# Patient Record
Sex: Male | Born: 1970
Health system: Southern US, Community
[De-identification: ages and names within clinical notes are randomized; demographics above are authoritative.]

## PROBLEM LIST (undated history)

## (undated) DIAGNOSIS — E119 Type 2 diabetes mellitus without complications: Secondary | ICD-10-CM

## (undated) DIAGNOSIS — G459 Transient cerebral ischemic attack, unspecified: Secondary | ICD-10-CM

## (undated) DIAGNOSIS — F419 Anxiety disorder, unspecified: Secondary | ICD-10-CM

## (undated) DIAGNOSIS — F32A Depression, unspecified: Secondary | ICD-10-CM

## (undated) DIAGNOSIS — I10 Essential (primary) hypertension: Secondary | ICD-10-CM

## (undated) DIAGNOSIS — G47419 Narcolepsy without cataplexy: Secondary | ICD-10-CM

## (undated) DIAGNOSIS — F329 Major depressive disorder, single episode, unspecified: Secondary | ICD-10-CM

## (undated) DIAGNOSIS — N200 Calculus of kidney: Secondary | ICD-10-CM

## (undated) DIAGNOSIS — G4700 Insomnia, unspecified: Secondary | ICD-10-CM

## (undated) HISTORY — DX: Transient cerebral ischemic attack, unspecified: G45.9

## (undated) HISTORY — DX: Type 2 diabetes mellitus without complications: E11.9

## (undated) HISTORY — PX: NO PAST SURGERIES: SHX2092

---

## 2009-08-06 ENCOUNTER — Emergency Department (HOSPITAL_COMMUNITY): Admission: EM | Admit: 2009-08-06 | Discharge: 2009-08-06 | Payer: Self-pay | Admitting: Emergency Medicine

## 2010-07-24 ENCOUNTER — Ambulatory Visit: Payer: Self-pay | Admitting: Cardiology

## 2010-08-04 ENCOUNTER — Ambulatory Visit: Payer: Self-pay | Admitting: Cardiology

## 2012-03-25 ENCOUNTER — Encounter (HOSPITAL_COMMUNITY): Payer: Self-pay | Admitting: Emergency Medicine

## 2012-03-25 ENCOUNTER — Emergency Department (HOSPITAL_COMMUNITY)
Admission: EM | Admit: 2012-03-25 | Discharge: 2012-03-26 | Disposition: A | Discharge status: Home / Self Care | Payer: Self-pay | Attending: Emergency Medicine | Admitting: Emergency Medicine

## 2012-03-25 ENCOUNTER — Emergency Department (HOSPITAL_COMMUNITY): Payer: Self-pay

## 2012-03-25 DIAGNOSIS — Z79899 Other long term (current) drug therapy: Secondary | ICD-10-CM | POA: Insufficient documentation

## 2012-03-25 DIAGNOSIS — M79609 Pain in unspecified limb: Secondary | ICD-10-CM | POA: Insufficient documentation

## 2012-03-25 DIAGNOSIS — I10 Essential (primary) hypertension: Secondary | ICD-10-CM | POA: Insufficient documentation

## 2012-03-25 DIAGNOSIS — R079 Chest pain, unspecified: Secondary | ICD-10-CM | POA: Insufficient documentation

## 2012-03-25 DIAGNOSIS — R51 Headache: Secondary | ICD-10-CM | POA: Insufficient documentation

## 2012-03-25 HISTORY — DX: Essential (primary) hypertension: I10

## 2012-03-25 HISTORY — DX: Narcolepsy without cataplexy: G47.419

## 2012-03-25 LAB — CBC
HCT: 46.3 % (ref 39.0–52.0)
MCH: 28.1 pg (ref 26.0–34.0)
MCHC: 32.4 g/dL (ref 30.0–36.0)
RBC: 5.33 MIL/uL (ref 4.22–5.81)
RDW: 13.5 % (ref 11.5–15.5)

## 2012-03-25 LAB — BASIC METABOLIC PANEL
CO2: 29 mEq/L (ref 19–32)
Calcium: 9.8 mg/dL (ref 8.4–10.5)
Chloride: 103 mEq/L (ref 96–112)
Creatinine, Ser: 0.96 mg/dL (ref 0.50–1.35)
GFR calc Af Amer: >90 mL/min (ref >90)
Potassium: 4.3 mEq/L (ref 3.5–5.1)
Sodium: 141 mEq/L (ref 135–145)

## 2012-03-25 LAB — DIFFERENTIAL
Basophils Absolute: 0 10*3/uL (ref 0.0–0.1)
Basophils Relative: 0 % (ref 0–1)
Eosinophils Absolute: 0.4 10*3/uL (ref 0.0–0.7)
Eosinophils Relative: 5 % (ref 0–5)
Lymphs Abs: 1.7 10*3/uL (ref 0.7–4.0)
Monocytes Relative: 9 % (ref 3–12)

## 2012-03-25 MED ORDER — NITROGLYCERIN 0.4 MG SL SUBL
0.4000 mg | SUBLINGUAL_TABLET | SUBLINGUAL | Status: DC | PRN
  Administered 2012-03-25: 0.4 mg via SUBLINGUAL
  Filled 2012-03-25: qty 25

## 2012-03-25 MED ORDER — SODIUM CHLORIDE 0.9 % IV SOLN
INTRAVENOUS | Status: DC
  Administered 2012-03-25: via INTRAVENOUS

## 2012-03-25 MED ORDER — SODIUM CHLORIDE 0.9 % IV BOLUS (SEPSIS)
250.0000 mL | Freq: Once | INTRAVENOUS | Status: AC
  Administered 2012-03-25: 250 mL via INTRAVENOUS

## 2012-03-25 MED ORDER — ASPIRIN 81 MG PO CHEW
324.0000 mg | CHEWABLE_TABLET | Freq: Once | ORAL | Status: AC
  Administered 2012-03-25: 324 mg via ORAL
  Filled 2012-03-25: qty 4

## 2012-03-25 NOTE — ED Notes (Signed)
Remains resting sitting up in bed. Denies needs. No distress. Equal chest rise and fall, regular, unlabored. Wife at bedside. Call bell within reach. Will continue to monitor.

## 2012-03-25 NOTE — ED Provider Notes (Signed)
History   This chart was scribed for Shelda Jakes, MD by Shari Heritage. The patient was seen in room APA07/APA07. Patient's care was started at 56.     CSN: [id number]  Arrival date & time 03/25/12  1913   First MD Initiated Contact with Patient 03/25/12 2205      Chief Complaint  Patient presents with  . Hypertension    (Consider location/radiation/quality/duration/timing/severity/associated sxs/prior treatment) Patient is a 41 y.o. male presenting with shoulder pain and chest pain.  Shoulder Pain This is a new problem. The current episode started 6 to 12 hours ago. The problem occurs constantly. The problem has not changed since onset.Associated symptoms include chest pain and headaches. Pertinent negatives include no abdominal pain and no shortness of breath. Nothing aggravates the symptoms.  Chest Pain The chest pain began 6 - 12 hours ago. Chest pain occurs constantly. The chest pain is unchanged. The severity of the pain is moderate. The quality of the pain is described as squeezing. The pain radiates to the left shoulder. Pertinent negatives for primary symptoms include no fever, no shortness of breath, no cough, no abdominal pain, no nausea and no vomiting.  Associated symptoms include diaphoresis.  Pertinent negatives for associated symptoms include no lower extremity edema. He tried nothing for the symptoms.  His past medical history is significant for hypertension.  His family medical history is significant for heart disease in family.    Gary Wyatt is a 41 y.o. male who presents to the Emergency Department complaining of constant, moderate left shoulder pain and left-sided chest pain onset 8 hours ago. Patient says pain is not worsened by movement of his arm. Patient describes pain in shoulder and chest as "squeezing." Patient is also experiencing diaphoresis, HA and neck pain. Patient ranks the pain as 5/10. Patient said he came in today because his BP was 155/107  after a recent work check. Patient's BP now is 144/98 (10:55PM). Patient denies SOB, nausea, vomiting, visual changes, fever, back pain, sore throat, cough, congestion, dysuria, leg swelling, rash, no history of bleeding problems. Patient takes Prilosec and beta blockers to manage his blood pressure. Patient has no history of MI in his family. Patient with h/o HTN and narcolepsy. Patient has never smoked.    PCP - Viss Jonita Albee, [place])  Past Medical History  Diagnosis Date  . Hypertension   . Narcolepsy     History reviewed. No pertinent past surgical history.  History reviewed. No pertinent family history.  History  Substance Use Topics  . Smoking status: Never Smoker   . Smokeless tobacco: Not on file  . Alcohol Use: Yes     ocassionally      Review of Systems  Constitutional: Positive for diaphoresis. Negative for fever.  HENT: Positive for neck pain. Negative for congestion and sore throat.   Eyes: Negative for visual disturbance.  Respiratory: Negative for cough and shortness of breath.   Cardiovascular: Positive for chest pain. Negative for leg swelling.  Gastrointestinal: Negative for nausea, vomiting, abdominal pain and diarrhea.  Genitourinary: Negative for dysuria.  Musculoskeletal: Negative for back pain.  Skin: Negative for rash.  Neurological: Positive for headaches.   Patient is positive for shoulder pain, chest pain, diaphoresis, HA, and neck pain. Patient is negative for SOB, nausea, vomiting, visual changes, fever, back pain, sore throat, cough, congestion, dysuria, leg swelling, and rash.  Allergies  Demerol  Home Medications   Current Outpatient Rx  Name Route Sig Dispense Refill  . METHYLPHENIDATE  HCL 20 MG PO TABS Oral Take 20 mg by mouth continuous dialysis.    Marland Kitchen METOPROLOL TARTRATE 50 MG PO TABS Oral Take 75 mg by mouth daily.    Marland Kitchen OMEPRAZOLE 20 MG PO CPDR Oral Take 20 mg by mouth daily.    . ASPIRIN 81 MG PO CHEW Oral Chew 1 tablet (81 mg total) by  mouth daily. 30 tablet 1  . HYDROCODONE-ACETAMINOPHEN 5-325 MG PO TABS Oral Take 1-2 tablets by mouth every 6 (six) hours as needed for pain. 10 tablet 0    BP 119/70  Pulse 67  Temp 97.4 F (36.3 C) (Oral)  Resp 18  Ht 6\' 2"  (1.88 m)  Wt 220 lb (99.791 kg)  BMI 28.25 kg/m2  SpO2 97%  Physical Exam  Nursing note and vitals reviewed. Constitutional: He is oriented to person, place, and time. He appears well-developed and well-nourished.  HENT:  Head: Normocephalic and atraumatic.  Eyes: Conjunctivae and EOM are normal. Pupils are equal, round, and reactive to light.  Neck: Normal range of motion. Neck supple.  Cardiovascular: Normal rate, regular rhythm and normal heart sounds.   No murmur heard. Pulmonary/Chest: Effort normal and breath sounds normal. No respiratory distress. He has no wheezes. He has no rales.  Abdominal: Soft. Bowel sounds are normal. There is no tenderness.  Musculoskeletal: Normal range of motion.       Right ankle: He exhibits no swelling.       Left ankle: He exhibits no swelling.  Neurological: He is alert and oriented to person, place, and time.  Skin: Skin is warm and dry.  Psychiatric: He has a normal mood and affect.    ED Course  Procedures (including critical care time) DIAGNOSTIC STUDIES: Oxygen Saturation is 100% on room air, normal by my interpretation.    COORDINATION OF CARE: 10:49PM- Patient informed of current plan for treatment and evaluation and agrees with plan at this time. BP is 144/98.  Results for orders placed during the hospital encounter of 03/25/12  CBC      Component Value Range   WBC 7.1  4.0 - 10.5 K/uL   RBC 5.33  4.22 - 5.81 MIL/uL   Hemoglobin 15.0  13.0 - 17.0 g/dL   HCT 78.2  95.6 - 21.3 %   MCV 86.9  78.0 - 100.0 fL   MCH 28.1  26.0 - 34.0 pg   MCHC 32.4  30.0 - 36.0 g/dL   RDW 08.6  57.8 - 46.9 %   Platelets 243  150 - 400 K/uL  DIFFERENTIAL      Component Value Range   Neutrophils Relative 62  43 - 77 %     Neutro Abs 4.3  1.7 - 7.7 K/uL   Lymphocytes Relative 24  12 - 46 %   Lymphs Abs 1.7  0.7 - 4.0 K/uL   Monocytes Relative 9  3 - 12 %   Monocytes Absolute 0.6  0.1 - 1.0 K/uL   Eosinophils Relative 5  0 - 5 %   Eosinophils Absolute 0.4  0.0 - 0.7 K/uL   Basophils Relative 0  0 - 1 %   Basophils Absolute 0.0  0.0 - 0.1 K/uL  BASIC METABOLIC PANEL      Component Value Range   Sodium 141  135 - 145 mEq/L   Potassium 4.3  3.5 - 5.1 mEq/L   Chloride 103  96 - 112 mEq/L   CO2 29  19 - 32 mEq/L   Glucose,  Bld 97  70 - 99 mg/dL   BUN 11  6 - 23 mg/dL   Creatinine, Ser 1.61  0.50 - 1.35 mg/dL   Calcium 9.8  8.4 - 09.6 mg/dL   GFR calc non Af Amer >90  >90 mL/min   GFR calc Af Amer >90  >90 mL/min  TROPONIN I      Component Value Range   Troponin I <0.30  <0.30 ng/mL  D-DIMER, QUANTITATIVE      Component Value Range   D-Dimer, Quant <0.22  0.00 - 0.48 ug/mL-FEU    Dg Chest 2 View  03/26/2012  *RADIOLOGY REPORT*  Clinical Data: Hypertension; chest pain.  CHEST - 2 VIEW  Comparison: None.  Findings: The lungs are well-aerated.  Minimal left basilar opacity likely reflects atelectasis.  There is no evidence of pleural effusion or pneumothorax.  The heart is borderline normal in size; the mediastinal contour is within normal limits.  No acute osseous abnormalities are seen.  IMPRESSION: Minimal left basilar opacity likely reflects atelectasis; lungs otherwise clear.  Original Report Authenticated By: Tonia Ghent, M.D.    Date: 03/26/2012  Rate: 56  Rhythm: sinus bradycardia  QRS Axis: normal  Intervals: normal  ST/T Wave abnormalities: normal  Conduction Disutrbances:none  Narrative Interpretation:   Old EKG Reviewed: none available    1. Chest pain       MDM  Patient with onset of left-sided chest pressure and shoulder and arm pain at 3:00 in the afternoon his been constant ever is gone away so first cardiac marker was done after 6 hours and that was negative also d-dimer  negative for pulmonary embolism patient has no leg swelling. No risk factors for pulmonary embolism. Patient has a primary care doctor in the near and can followup with him will recommend starting a baby aspirin a day and hydrocodone as needed for pain. EKG without any acute changes cardiac markers not consistent with acute cardiac event chest x-rays negative for pneumonia or pneumothorax d-dimer not consistent with pulmonary embolism.      I personally performed the services described in this documentation, which was scribed in my presence. The recorded information has been reviewed and considered.     Shelda Jakes, MD 03/26/12 [phone]

## 2012-03-25 NOTE — ED Notes (Signed)
Into room to assess patient. Placed in gown. States about one hour ago, He started having 5\10 middle chest pain that radiates to left shoulder and left elbow. States it is a constant "nagging pain". Nothing makes it better or worse. Denies any nausea, vomiting, diarrhea. States he is also having pain above bilateral eyes that is also a constant, nagging pain. Denies blurred vision or any visual disturbances. Nothing makes pain better or worse. Denies any weaknesses. S1 and S2 present. No extra heart sounds. Clear lung sounds in all fields. Awaiting MD eval.

## 2012-03-25 NOTE — ED Notes (Signed)
Chest pain decreased to 3\10 at this time. Pain in left arm remains. Holding third nitro. BP 110/75.

## 2012-03-25 NOTE — ED Notes (Signed)
Placed on cardiac monitor. Sinus rhythm at 60 bpm, regular. sats 100% on room air. Remains having 5\10 chest pain. A&O x 4.

## 2012-03-25 NOTE — ED Notes (Signed)
First SL nitro given. 5\10 chest pain.

## 2012-03-25 NOTE — ED Notes (Addendum)
Patient states he was at work and had them check his B/P. Stated it was 155/107. Also complaining of left shoulder pain. Denies injury.

## 2012-03-25 NOTE — ED Notes (Signed)
Patient transported to X-ray 

## 2012-03-25 NOTE — ED Notes (Addendum)
States tightness in chest has increased to a 6\10. sats maintaining at 97%. Pulse 63, sinus rhythm. A&O x 4. Call bell and wife at bedside. Second nitro SL given. Tolerating well.

## 2012-03-26 MED ORDER — ASPIRIN 81 MG PO CHEW: 81.0000 mg | CHEWABLE_TABLET | Freq: Every day | ORAL | Status: AC

## 2012-03-26 MED ORDER — HYDROCODONE-ACETAMINOPHEN 5-325 MG PO TABS: 1.0000 | ORAL_TABLET | Freq: Four times a day (QID) | ORAL | Status: AC | PRN

## 2012-03-26 NOTE — Discharge Instructions (Signed)
Workup for chest pain was negative followup with your regular Dr. In the next few days. Return for any new or worse symptoms. Take hydrocodone as needed for pain. Also recommend start taking a baby aspirin a day. Work note provided for the next 2 days to rest.

## 2012-03-26 NOTE — ED Notes (Signed)
Patient back to room from radiology. Pain 3\10. No distress. Equal chest rise and fall, regular, unlabored. Call bell within reach.

## 2012-03-26 NOTE — ED Notes (Signed)
MD at bedside to speak with about plan of care.

## 2012-12-18 ENCOUNTER — Encounter: Payer: Self-pay | Admitting: Cardiology

## 2012-12-18 DIAGNOSIS — R079 Chest pain, unspecified: Secondary | ICD-10-CM

## 2013-11-17 ENCOUNTER — Encounter (HOSPITAL_COMMUNITY): Payer: Self-pay | Admitting: Emergency Medicine

## 2013-11-17 ENCOUNTER — Emergency Department (HOSPITAL_COMMUNITY): Payer: Managed Care, Other (non HMO)

## 2013-11-17 ENCOUNTER — Emergency Department (HOSPITAL_COMMUNITY)
Admission: EM | Admit: 2013-11-17 | Discharge: 2013-11-17 | Disposition: A | Discharge status: Home / Self Care | Payer: Managed Care, Other (non HMO) | Attending: Emergency Medicine | Admitting: Emergency Medicine

## 2013-11-17 DIAGNOSIS — Y939 Activity, unspecified: Secondary | ICD-10-CM | POA: Insufficient documentation

## 2013-11-17 DIAGNOSIS — Z79899 Other long term (current) drug therapy: Secondary | ICD-10-CM | POA: Insufficient documentation

## 2013-11-17 DIAGNOSIS — IMO0001 Reserved for inherently not codable concepts without codable children: Secondary | ICD-10-CM | POA: Insufficient documentation

## 2013-11-17 DIAGNOSIS — G47419 Narcolepsy without cataplexy: Secondary | ICD-10-CM | POA: Insufficient documentation

## 2013-11-17 DIAGNOSIS — S29011A Strain of muscle and tendon of front wall of thorax, initial encounter: Secondary | ICD-10-CM

## 2013-11-17 DIAGNOSIS — Y929 Unspecified place or not applicable: Secondary | ICD-10-CM | POA: Insufficient documentation

## 2013-11-17 DIAGNOSIS — M25539 Pain in unspecified wrist: Secondary | ICD-10-CM | POA: Insufficient documentation

## 2013-11-17 DIAGNOSIS — M25519 Pain in unspecified shoulder: Secondary | ICD-10-CM | POA: Insufficient documentation

## 2013-11-17 DIAGNOSIS — X58XXXA Exposure to other specified factors, initial encounter: Secondary | ICD-10-CM | POA: Insufficient documentation

## 2013-11-17 DIAGNOSIS — I10 Essential (primary) hypertension: Secondary | ICD-10-CM

## 2013-11-17 DIAGNOSIS — IMO0002 Reserved for concepts with insufficient information to code with codable children: Secondary | ICD-10-CM | POA: Insufficient documentation

## 2013-11-17 LAB — TROPONIN I: Troponin I: <0.3 ng/mL (ref <0.30)

## 2013-11-17 LAB — CBC WITH DIFFERENTIAL/PLATELET
BASOS ABS: 0 10*3/uL (ref 0.0–0.1)
BASOS PCT: 0 % (ref 0–1)
EOS PCT: 1 % (ref 0–5)
Eosinophils Absolute: 0.1 10*3/uL (ref 0.0–0.7)
HCT: 45.8 % (ref 39.0–52.0)
Hemoglobin: 15.5 g/dL (ref 13.0–17.0)
Lymphocytes Relative: 22 % (ref 12–46)
Lymphs Abs: 2.1 10*3/uL (ref 0.7–4.0)
MCH: 29.6 pg (ref 26.0–34.0)
MCHC: 33.8 g/dL (ref 30.0–36.0)
MCV: 87.4 fL (ref 78.0–100.0)
Monocytes Absolute: 0.9 10*3/uL (ref 0.1–1.0)
Monocytes Relative: 9 % (ref 3–12)
NEUTROS ABS: 6.6 10*3/uL (ref 1.7–7.7)
NEUTROS PCT: 68 % (ref 43–77)
Platelets: 239 10*3/uL (ref 150–400)
RBC: 5.24 MIL/uL (ref 4.22–5.81)
RDW: 13.6 % (ref 11.5–15.5)
WBC: 9.7 10*3/uL (ref 4.0–10.5)

## 2013-11-17 LAB — COMPREHENSIVE METABOLIC PANEL
ALT: 19 U/L (ref 0–53)
AST: 15 U/L (ref 0–37)
Albumin: 3.8 g/dL (ref 3.5–5.2)
Alkaline Phosphatase: 51 U/L (ref 39–117)
BUN: 14 mg/dL (ref 6–23)
CO2: 28 meq/L (ref 19–32)
CREATININE: 0.99 mg/dL (ref 0.50–1.35)
Calcium: 9.2 mg/dL (ref 8.4–10.5)
Chloride: 102 mEq/L (ref 96–112)
GLUCOSE: 113 mg/dL — AB (ref 70–99)
Potassium: 4 mEq/L (ref 3.7–5.3)
Sodium: 141 mEq/L (ref 137–147)
TOTAL PROTEIN: 6.6 g/dL (ref 6.0–8.3)
Total Bilirubin: 0.4 mg/dL (ref 0.3–1.2)

## 2013-11-17 MED ORDER — KETOROLAC TROMETHAMINE 60 MG/2ML IM SOLN
60.0000 mg | Freq: Once | INTRAMUSCULAR | Status: AC
  Administered 2013-11-17: 60 mg via INTRAMUSCULAR
  Filled 2013-11-17: qty 2

## 2013-11-17 MED ORDER — METHOCARBAMOL 500 MG PO TABS
500.0000 mg | ORAL_TABLET | Freq: Once | ORAL | Status: AC
  Administered 2013-11-17: 500 mg via ORAL
  Filled 2013-11-17: qty 1

## 2013-11-17 MED ORDER — IBUPROFEN 600 MG PO TABS: 600.0000 mg | ORAL_TABLET | Freq: Four times a day (QID) | ORAL | Status: DC | PRN

## 2013-11-17 MED ORDER — METHOCARBAMOL 500 MG PO TABS: 500.0000 mg | ORAL_TABLET | Freq: Two times a day (BID) | ORAL | Status: DC | PRN

## 2013-11-17 NOTE — ED Notes (Signed)
Pt c/o left chest pain that radiates into shoulder. Per ems pt blood pressure was high.

## 2013-11-17 NOTE — Discharge Instructions (Signed)
Followup with your primary MD to reevaluate your elevated blood pressure. Take medications as prescribed for your chest wall strain. Return immediately for worsening symptoms, shortness of breath, fever or for any concerns.  Muscle Strain A muscle strain is an injury that occurs when a muscle is stretched beyond its normal length. Usually a small number of muscle fibers are torn when this happens. Muscle strain is rated in degrees. First-degree strains have the least amount of muscle fiber tearing and pain. Second-degree and third-degree strains have increasingly more tearing and pain.  Usually, recovery from muscle strain takes 1 2 weeks. Complete healing takes 5 6 weeks.  CAUSES  Muscle strain happens when a sudden, violent force placed on a muscle stretches it too far. This may occur with lifting, sports, or a fall.  RISK FACTORS Muscle strain is especially common in athletes.  SIGNS AND SYMPTOMS At the site of the muscle strain, there may be:  Pain.  Bruising.  Swelling.  Difficulty using the muscle due to pain or lack of normal function. DIAGNOSIS  Your health care provider will perform a physical exam and ask about your medical history. TREATMENT  Often, the best treatment for a muscle strain is resting, icing, and applying cold compresses to the injured area.  HOME CARE INSTRUCTIONS   Use the PRICE method of treatment to promote muscle healing during the first 2 3 days after your injury. The PRICE method involves:  Protecting the muscle from being injured again.  Restricting your activity and resting the injured body part.  Icing your injury. To do this, put ice in a plastic bag. Place a towel between your skin and the bag. Then, apply the ice and leave it on from 15 20 minutes each hour. After the third day, switch to moist heat packs.  Apply compression to the injured area with a splint or elastic bandage. Be careful not to wrap it too tightly. This may interfere with blood  circulation or increase swelling.  Elevate the injured body part above the level of your heart as often as you can.  Only take over-the-counter or prescription medicines for pain, discomfort, or fever as directed by your health care provider.  Warming up prior to exercise helps to prevent future muscle strains. SEEK MEDICAL CARE IF:   You have increasing pain or swelling in the injured area.  You have numbness, tingling, or a significant loss of strength in the injured area. MAKE SURE YOU:   Understand these instructions.  Will watch your condition.  Will get help right away if you are not doing well or get worse. Document Released: 09/24/2005 Document Revised: 07/15/2013 Document Reviewed: 04/23/2013 [facility] Patient Information 2014 [place], [place].  Hypertension Hypertension is another name for high blood pressure. High blood pressure may mean that your heart needs to work harder to pump blood. Blood pressure consists of two numbers, which includes a higher number over a lower number (example: 110/72). HOME CARE   Make lifestyle changes as told by your doctor. This may include weight loss and exercise.  Take your blood pressure medicine every day.  Limit how much salt you use.  Stop smoking if you smoke.  Do not use drugs.  Talk to your doctor if you are using decongestants or birth control pills. These medicines might make blood pressure higher.  Females should not drink more than 1 alcoholic drink per day. Males should not drink more than 2 alcoholic drinks per day.  See your doctor as told.  GET HELP RIGHT AWAY IF:   You have a blood pressure reading with a top number of 180 or higher.  You get a very bad headache.  You get blurred or changing vision.  You feel confused.  You feel weak, numb, or faint.  You get chest or belly (abdominal) pain.  You throw up (vomit).  You cannot breathe very well. MAKE SURE YOU:   Understand these instructions.  Will  watch your condition.  Will get help right away if you are not doing well or get worse. Document Released: 03/12/2008 Document Revised: 12/17/2011 Document Reviewed: 03/12/2008 [facility] Patient Information 2014 [place], [place].

## 2013-11-17 NOTE — ED Notes (Signed)
Patient transported to CT. Family at bedside.  

## 2013-11-17 NOTE — ED Provider Notes (Signed)
CSN: [id number]     Arrival date & time 11/17/13  0026 History   First MD Initiated Contact with Patient 11/17/13 0058     Chief Complaint  Patient presents with  . Chest Pain     (Consider location/radiation/quality/duration/timing/severity/associated sxs/prior Treatment) HPI Patient presents with one day of left sided upper chest and left upper arm pain. Patient states he does not remember any trauma. It became worse while he was at work. The pain is reproduced when palpated or range of motion of the left shoulder. He has no shortness of breath associated with it. He has no history of coronary artery disease. He does have a history of high blood pressure and he took his blood pressure and states that it was elevated. He has no recent surgeries or immobilization. He has no lower extremity swelling or pain. Past Medical History  Diagnosis Date  . Hypertension   . Narcolepsy    History reviewed. No pertinent past surgical history. No family history on file. History  Substance Use Topics  . Smoking status: Never Smoker   . Smokeless tobacco: Not on file  . Alcohol Use: Yes     Comment: ocassionally    Review of Systems  Constitutional: Negative for fever and chills.  Respiratory: Negative for cough and shortness of breath.   Cardiovascular: Positive for chest pain. Negative for palpitations and leg swelling.  Gastrointestinal: Negative for vomiting, abdominal pain and diarrhea.  Musculoskeletal: Positive for myalgias. Negative for arthralgias, neck pain and neck stiffness.  Skin: Negative for rash and wound.  Neurological: Negative for dizziness, weakness and numbness.  All other systems reviewed and are negative.      Allergies  Demerol  Home Medications   Current Outpatient Rx  Name  Route  Sig  Dispense  Refill  . methylphenidate (RITALIN) 20 MG tablet   Oral   Take 20 mg by mouth continuous dialysis.         Marland Kitchen metoprolol (LOPRESSOR) 50 MG tablet   Oral   Take  75 mg by mouth daily.         Marland Kitchen omeprazole (PRILOSEC) 20 MG capsule   Oral   Take 20 mg by mouth daily.         Marland Kitchen ibuprofen (ADVIL,MOTRIN) 600 MG tablet   Oral   Take 1 tablet (600 mg total) by mouth every 6 (six) hours as needed.   30 tablet   0   . methocarbamol (ROBAXIN) 500 MG tablet   Oral   Take 1 tablet (500 mg total) by mouth 2 (two) times daily as needed for muscle spasms.   20 tablet   0    BP 138/94  Pulse 66  Temp(Src) 98.2 F (36.8 C)  Resp 21  Ht 6\' 2"  (1.88 m)  Wt 220 lb (99.791 kg)  BMI 28.23 kg/m2  SpO2 99% Physical Exam  Nursing note and vitals reviewed. Constitutional: He is oriented to person, place, and time. He appears well-developed and well-nourished. No distress.  HENT:  Head: Normocephalic and atraumatic.  Mouth/Throat: Oropharynx is clear and moist.  Eyes: EOM are normal. Pupils are equal, round, and reactive to light.  Neck: Normal range of motion. Neck supple.  Cardiovascular: Normal rate and regular rhythm.   Pulmonary/Chest: Effort normal and breath sounds normal. No respiratory distress. He has no wheezes. He has no rales. He exhibits tenderness (left pectoralis major tenderness to palpation without crepitance or deformity. The pain is also reproduced with range of motion  of the left shoulder.).  Abdominal: Soft. Bowel sounds are normal. He exhibits no distension and no mass. There is no tenderness. There is no rebound and no guarding.  Musculoskeletal: Normal range of motion. He exhibits tenderness. He exhibits no edema.  Left bicep tenderness to palpation. The pain is reproduced with range of motion of the left shoulder and left elbow. Patient has 2+ radial pulses. There is no obvious trauma or deformity. Patient has soft calf compartments without tenderness.  Neurological: He is alert and oriented to person, place, and time.  Patient is alert and oriented x3 with clear, goal oriented speech. Patient has 5/5 motor in all extremities.  Sensation is intact to light touch. Bilateral finger-to-nose is normal with no signs of dysmetria. Patient has a normal gait and walks without assistance.   Skin: Skin is warm and dry. No rash noted. No erythema.  Psychiatric: He has a normal mood and affect. His behavior is normal.    ED Course  Procedures (including critical care time) Labs Review Labs Reviewed  COMPREHENSIVE METABOLIC PANEL - Abnormal; Notable for the following:    Glucose, Bld 113 (*)    All other components within normal limits  CBC WITH DIFFERENTIAL  TROPONIN I   Imaging Review Dg Chest 2 View  11/17/2013   CLINICAL DATA:  Chest pain  EXAM: CHEST  2 VIEW  COMPARISON:  December 18, 2012  FINDINGS: The heart size and mediastinal contours are stable. There is no focal infiltrate, pulmonary edema, or pleural effusion. The visualized skeletal structures are stable.  IMPRESSION: No active cardiopulmonary disease.   Electronically Signed   By: Sherian ReinWei-Chen  Lin M.D.   On: 11/17/2013 02:10    EKG Interpretation    Date/Time:  Tuesday November 17 2013 00:40:07 EST Ventricular Rate:  72 PR Interval:  134 QRS Duration: 74 QT Interval:  382 QTC Calculation: 418 R Axis:   8 Text Interpretation:  Normal sinus rhythm Normal ECG When compared with ECG of 25-Mar-2012 21:57, No significant change was found Confirmed by Ranae PalmsYELVERTON  MD, Lainey Nelson ([id number]) on 11/17/2013 2:40:53 AM            MDM   Final diagnoses:  Chest wall muscle strain  Hypertension    Patient's history and exam are consistent with chest wall muscle strain specifically of the left pectoralis and left biceps muscles. EKG is within normal limits. Troponin is normal. Chest x-ray without any acute findings. I have very low suspicion for coronary artery disease or thromboembolic disease. Will treat with anti-inflammatory and muscle relaxants. Patient has been advised to followup with his primary doctor regarding his elevated blood pressure. Return precautions have  been given and the patient voiced understanding.    Loren Raceravid Jaysion Ramseyer, MD 11/17/13 [phone]

## 2013-12-22 ENCOUNTER — Ambulatory Visit: Payer: Managed Care, Other (non HMO) | Admitting: Physical Therapy

## 2014-08-04 ENCOUNTER — Ambulatory Visit (HOSPITAL_COMMUNITY): Payer: Self-pay | Admitting: Psychiatry

## 2014-10-24 ENCOUNTER — Inpatient Hospital Stay (HOSPITAL_COMMUNITY): Payer: Managed Care, Other (non HMO)

## 2014-10-24 ENCOUNTER — Emergency Department (HOSPITAL_COMMUNITY): Payer: Managed Care, Other (non HMO)

## 2014-10-24 ENCOUNTER — Encounter (HOSPITAL_COMMUNITY): Payer: Self-pay | Admitting: Emergency Medicine

## 2014-10-24 ENCOUNTER — Inpatient Hospital Stay (HOSPITAL_COMMUNITY)
Admission: EM | Admit: 2014-10-24 | Discharge: 2014-10-25 | DRG: 313 | Disposition: A | Discharge status: Home / Self Care | Payer: Managed Care, Other (non HMO) | Attending: Internal Medicine | Admitting: Internal Medicine

## 2014-10-24 DIAGNOSIS — K55069 Acute infarction of intestine, part and extent unspecified: Secondary | ICD-10-CM

## 2014-10-24 DIAGNOSIS — Z791 Long term (current) use of non-steroidal anti-inflammatories (NSAID): Secondary | ICD-10-CM | POA: Diagnosis not present

## 2014-10-24 DIAGNOSIS — I10 Essential (primary) hypertension: Secondary | ICD-10-CM | POA: Diagnosis present

## 2014-10-24 DIAGNOSIS — R079 Chest pain, unspecified: Secondary | ICD-10-CM

## 2014-10-24 DIAGNOSIS — E1159 Type 2 diabetes mellitus with other circulatory complications: Secondary | ICD-10-CM | POA: Insufficient documentation

## 2014-10-24 DIAGNOSIS — R197 Diarrhea, unspecified: Secondary | ICD-10-CM | POA: Diagnosis present

## 2014-10-24 DIAGNOSIS — R519 Headache, unspecified: Secondary | ICD-10-CM | POA: Diagnosis present

## 2014-10-24 DIAGNOSIS — K550 Acute vascular disorders of intestine: Secondary | ICD-10-CM

## 2014-10-24 DIAGNOSIS — I81 Portal vein thrombosis: Secondary | ICD-10-CM

## 2014-10-24 DIAGNOSIS — R51 Headache: Secondary | ICD-10-CM

## 2014-10-24 DIAGNOSIS — R071 Chest pain on breathing: Secondary | ICD-10-CM

## 2014-10-24 DIAGNOSIS — R0789 Other chest pain: Secondary | ICD-10-CM | POA: Diagnosis present

## 2014-10-24 HISTORY — DX: Insomnia, unspecified: G47.00

## 2014-10-24 HISTORY — DX: Depression, unspecified: F32.A

## 2014-10-24 HISTORY — DX: Major depressive disorder, single episode, unspecified: F32.9

## 2014-10-24 HISTORY — DX: Anxiety disorder, unspecified: F41.9

## 2014-10-24 LAB — CBC
HCT: 47.7 % (ref 39.0–52.0)
Hemoglobin: 15.4 g/dL (ref 13.0–17.0)
MCH: 27.7 pg (ref 26.0–34.0)
MCHC: 32.3 g/dL (ref 30.0–36.0)
MCV: 85.8 fL (ref 78.0–100.0)
Platelets: 204 10*3/uL (ref 150–400)
RBC: 5.56 MIL/uL (ref 4.22–5.81)
RDW: 13.3 % (ref 11.5–15.5)
WBC: 5.4 10*3/uL (ref 4.0–10.5)

## 2014-10-24 LAB — D-DIMER, QUANTITATIVE: D-Dimer, Quant: <0.27 ug/mL-FEU (ref 0.00–0.48)

## 2014-10-24 LAB — TROPONIN I
Troponin I: <0.03 ng/mL (ref <0.031)
Troponin I: <0.03 ng/mL (ref <0.031)

## 2014-10-24 LAB — BASIC METABOLIC PANEL
ANION GAP: 6 (ref 5–15)
BUN: 13 mg/dL (ref 6–23)
CHLORIDE: 106 meq/L (ref 96–112)
CO2: 27 mmol/L (ref 19–32)
Calcium: 8.8 mg/dL (ref 8.4–10.5)
Creatinine, Ser: 0.98 mg/dL (ref 0.50–1.35)
GFR calc non Af Amer: >90 mL/min (ref >90)
GLUCOSE: 124 mg/dL — AB (ref 70–99)
Potassium: 3.6 mmol/L (ref 3.5–5.1)
SODIUM: 139 mmol/L (ref 135–145)

## 2014-10-24 LAB — PROTIME-INR
INR: 0.97 (ref 0.00–1.49)
Prothrombin Time: 12.9 seconds (ref 11.6–15.2)

## 2014-10-24 LAB — TSH: TSH: 2.922 u[IU]/mL (ref 0.350–4.500)

## 2014-10-24 MED ORDER — CLONAZEPAM 0.5 MG PO TABS
1.0000 mg | ORAL_TABLET | Freq: Every day | ORAL | Status: DC
  Administered 2014-10-24: 1 mg via ORAL
  Filled 2014-10-24: qty 2

## 2014-10-24 MED ORDER — HYDROCODONE-ACETAMINOPHEN 5-325 MG PO TABS
1.0000 | ORAL_TABLET | Freq: Once | ORAL | Status: AC
  Administered 2014-10-24: 1 via ORAL
  Filled 2014-10-24: qty 1

## 2014-10-24 MED ORDER — HEPARIN BOLUS VIA INFUSION
5000.0000 [IU] | Freq: Once | INTRAVENOUS | Status: AC
  Administered 2014-10-24: 5000 [IU] via INTRAVENOUS

## 2014-10-24 MED ORDER — METOPROLOL TARTRATE 25 MG PO TABS
25.0000 mg | ORAL_TABLET | Freq: Once | ORAL | Status: AC
  Administered 2014-10-24: 25 mg via ORAL
  Filled 2014-10-24: qty 1

## 2014-10-24 MED ORDER — ONDANSETRON HCL 4 MG/2ML IJ SOLN
4.0000 mg | Freq: Four times a day (QID) | INTRAMUSCULAR | Status: DC | PRN
Start: 2014-10-24 — End: 2014-10-25
  Administered 2014-10-25: 4 mg via INTRAVENOUS
  Filled 2014-10-24: qty 2

## 2014-10-24 MED ORDER — METHYLPHENIDATE HCL 5 MG PO TABS
20.0000 mg | ORAL_TABLET | Freq: Two times a day (BID) | ORAL | Status: DC
  Administered 2014-10-24 – 2014-10-25 (×2): 20 mg via ORAL
  Filled 2014-10-24 (×2): qty 4

## 2014-10-24 MED ORDER — PAROXETINE HCL 20 MG PO TABS
10.0000 mg | ORAL_TABLET | Freq: Every day | ORAL | Status: DC
  Administered 2014-10-25: 10 mg via ORAL
  Filled 2014-10-24 (×3): qty 1

## 2014-10-24 MED ORDER — HEPARIN (PORCINE) IN NACL 100-0.45 UNIT/ML-% IJ SOLN
1800.0000 [IU]/h | INTRAMUSCULAR | Status: DC
  Administered 2014-10-24 – 2014-10-25 (×2): 1800 [IU]/h via INTRAVENOUS
  Filled 2014-10-24 (×2): qty 250

## 2014-10-24 MED ORDER — ASPIRIN 81 MG PO CHEW
324.0000 mg | CHEWABLE_TABLET | Freq: Once | ORAL | Status: AC
  Administered 2014-10-24: 324 mg via ORAL
  Filled 2014-10-24: qty 4

## 2014-10-24 MED ORDER — IOHEXOL 350 MG/ML SOLN
100.0000 mL | Freq: Once | INTRAVENOUS | Status: AC | PRN
  Administered 2014-10-24: 100 mL via INTRAVENOUS

## 2014-10-24 MED ORDER — METOPROLOL TARTRATE 25 MG PO TABS
25.0000 mg | ORAL_TABLET | Freq: Two times a day (BID) | ORAL | Status: DC
  Administered 2014-10-24 – 2014-10-25 (×2): 25 mg via ORAL
  Filled 2014-10-24 (×2): qty 1

## 2014-10-24 MED ORDER — ONDANSETRON HCL 4 MG PO TABS: 4.0000 mg | ORAL_TABLET | Freq: Four times a day (QID) | ORAL | Status: DC | PRN

## 2014-10-24 MED ORDER — SODIUM CHLORIDE 0.9 % IV SOLN
INTRAVENOUS | Status: DC
  Administered 2014-10-24: via INTRAVENOUS

## 2014-10-24 MED ORDER — SODIUM CHLORIDE 0.9 % IJ SOLN
3.0000 mL | Freq: Two times a day (BID) | INTRAMUSCULAR | Status: DC
  Administered 2014-10-24: 3 mL via INTRAVENOUS

## 2014-10-24 NOTE — ED Notes (Signed)
Pt reports hypertension since Friday at [facility] office. Pt reports chest pain that started last night. Pt reports pain is worse with a deep breath. nad noted.

## 2014-10-24 NOTE — ED Provider Notes (Signed)
CSN: [id number]     Arrival date & time 10/24/14  1523 History   First MD Initiated Contact with Patient 10/24/14 1535     Chief Complaint  Patient presents with  . Chest Pain     (Consider location/radiation/quality/duration/timing/severity/associated sxs/prior Treatment) HPI Comments: Patient complains of chest pain that started last night. It is left-sided and radiates to his left arm. The last 3-4 minutes at a time and is intermittent and coming and going. It is worse with palpation and taking a deep breath. Denies any cardiac history. He had a stress test about 3 years ago for similar pain that was negative. He saw his doctor on Friday for a refill of his narcolepsy medications and was noted to have elevated blood pressure. He states his blood pressure has been 160-180 systolic at home over the weekend. He is no longer on blood pressure medications but has taken metoprolol in the past. He was not started on his blood pressure but told to keep an eye on him by his doctor. He is concerned that his blood pressure has been elevated all weekend. He endorses mild headache and lightheadedness. No vertigo. No vomiting.  The history is provided by the patient.    Past Medical History  Diagnosis Date  . Hypertension   . Narcolepsy    History reviewed. No pertinent past surgical history. History reviewed. No pertinent family history. History  Substance Use Topics  . Smoking status: Never Smoker   . Smokeless tobacco: Not on file  . Alcohol Use: Yes     Comment: ocassionally    Review of Systems  Constitutional: Negative for fever, activity change and appetite change.  HENT: Negative for congestion and rhinorrhea.   Respiratory: Positive for chest tightness and shortness of breath.   Cardiovascular: Positive for chest pain.  Gastrointestinal: Negative for nausea, vomiting and abdominal distention.  Genitourinary: Negative for dysuria, urgency and hematuria.  Musculoskeletal: Positive  for myalgias and arthralgias. Negative for joint swelling.  Skin: Negative for rash.  Neurological: Positive for light-headedness and headaches. Negative for dizziness and weakness.  A complete 10 system review of systems was obtained and all systems are negative except as noted in the HPI and PMH.      Allergies  Demerol  Home Medications   Prior to Admission medications   Medication Sig Start Date End Date Taking? Authorizing Provider  clonazePAM (KLONOPIN) 1 MG tablet Take 1 mg by mouth at bedtime. 10/22/14  Yes Historical Provider, MD  ibuprofen (ADVIL,MOTRIN) 200 MG tablet Take 400 mg by mouth every 6 (six) hours as needed for headache.   Yes Historical Provider, MD  methylphenidate (RITALIN) 20 MG tablet Take 20 mg by mouth 2 (two) times daily.    Yes Historical Provider, MD  PARoxetine (PAXIL) 10 MG tablet Take 10 mg by mouth daily. 10/21/14  Yes Historical Provider, MD  ibuprofen (ADVIL,MOTRIN) 600 MG tablet Take 1 tablet (600 mg total) by mouth every 6 (six) hours as needed. Patient not taking: Reported on 10/24/2014 11/17/13   Loren Raceravid Yelverton, MD  methocarbamol (ROBAXIN) 500 MG tablet Take 1 tablet (500 mg total) by mouth 2 (two) times daily as needed for muscle spasms. Patient not taking: Reported on 10/24/2014 11/17/13   Loren Raceravid Yelverton, MD   BP 134/100 mmHg  Pulse 65  Temp(Src) 98.2 F (36.8 C) (Oral)  Resp 14  Ht 6\' 2"  (1.88 m)  Wt 220 lb (99.791 kg)  BMI 28.23 kg/m2  SpO2 98% Physical Exam  Constitutional:  He is oriented to person, place, and time. He appears well-developed and well-nourished. No distress.  HENT:  Head: Normocephalic and atraumatic.  Mouth/Throat: Oropharynx is clear and moist. No oropharyngeal exudate.  Eyes: Conjunctivae and EOM are normal. Pupils are equal, round, and reactive to light.  Neck: Normal range of motion. Neck supple.  No meningismus.  Cardiovascular: Normal rate, regular rhythm, normal heart sounds and intact distal pulses.   No  murmur heard. Pulmonary/Chest: Effort normal and breath sounds normal. No respiratory distress. He exhibits tenderness.  Reproducible left-sided chest wall tenderness  Abdominal: Soft. There is no tenderness. There is no rebound and no guarding.  Musculoskeletal: Normal range of motion. He exhibits no edema or tenderness.  Neurological: He is alert and oriented to person, place, and time. No cranial nerve deficit. He exhibits normal muscle tone. Coordination normal.  No ataxia on finger to nose bilaterally. No pronator drift. 5/5 strength throughout. CN 2-12 intact. Negative Romberg. Equal grip strength. Sensation intact. Gait is normal.   Skin: Skin is warm.  Psychiatric: He has a normal mood and affect. His behavior is normal.  Nursing note and vitals reviewed.   ED Course  Procedures (including critical care time) Labs Review Labs Reviewed  BASIC METABOLIC PANEL - Abnormal; Notable for the following:    Glucose, Bld 124 (*)    All other components within normal limits  CBC  TROPONIN I  D-DIMER, QUANTITATIVE  TROPONIN I  PROTIME-INR  HEPARIN LEVEL (UNFRACTIONATED)  CBC  TROPONIN I  TROPONIN I  TROPONIN I  TSH  COMPREHENSIVE METABOLIC PANEL    Imaging Review Ct Head Wo Contrast  10/24/2014   CLINICAL DATA:  ER patient who c/o headache. Patient had a DISSECTION study done earlier for LEFT side chest pain  EXAM: CT HEAD WITHOUT CONTRAST  TECHNIQUE: Contiguous axial images were obtained from the base of the skull through the vertex without intravenous contrast.  COMPARISON:  12/18/2012  FINDINGS: Ventricles normal in size and configuration. No parenchymal masses or mass effect. There is no evidence of an infarct. No areas of abnormal parenchymal attenuation.  There are no extra-axial masses or abnormal fluid collections.  No evidence of intracranial hemorrhage.  Visualized sinuses and mastoid air cells are clear. No skull lesion.  IMPRESSION: Normal CT scan of the brain.    Electronically Signed   By: Amie Portland M.D.   On: 10/24/2014 21:17   Dg Chest Port 1 View  10/24/2014   CLINICAL DATA:  Chest pain beginning today.  EXAM: PORTABLE CHEST - 1 VIEW  COMPARISON:  PA and lateral chest 11/17/2013 and 03/25/2012.  FINDINGS: The lungs are clear. Heart size is normal. There is no pneumothorax or pleural effusion. No focal bony abnormality.  IMPRESSION: Negative chest.   Electronically Signed   By: Drusilla Kanner M.D.   On: 10/24/2014 15:56   Ct Angio Chest Aorta W/cm &/or Wo/cm  10/24/2014   CLINICAL DATA:  Left upper chest and left arm pain for 1 day. No known injury.  EXAM: CT ANGIOGRAPHY CHEST, ABDOMEN AND PELVIS  TECHNIQUE: Multidetector CT imaging through the chest, abdomen and pelvis was performed using the standard protocol during bolus administration of intravenous contrast. Multiplanar reconstructed images and MIPs were obtained and reviewed to evaluate the vascular anatomy.  CONTRAST:  100 mL OMNIPAQUE IOHEXOL 350 MG/ML SOLN  COMPARISON:  PA and lateral chest 11/17/2013 and single view of the chest earlier today.  FINDINGS: CTA CHEST FINDINGS  There is no aortic dissection  or aneurysm. Bovine type aortic arch is noted. No calcific aortic or coronary atherosclerosis is identified. No pulmonary embolus is seen. There is no axillary, hilar or mediastinal lymphadenopathy. Heart size is normal. The lungs show only mild dependent atelectatic change. No focal bony abnormality is identified.  Review of the MIP images confirms the above findings.  CTA ABDOMEN AND PELVIS FINDINGS  There is no abdominal aortic dissection or aneurysm. Major branch vessels of the aorta are widely patent. Minimal calcific atherosclerosis is identified. Nonocclusive clot is seen in the inferior mesenteric vein. Occlusive clot is seen in a branch of the inferior mesenteric vein to the left lower quadrant which appears to drain small bowel loops. The inferior mesenteric vein is also not well opacified  with contrast but does not appear occluded.  There is diffuse fatty infiltration of the liver. The gallbladder, spleen, adrenal glands, pancreas and kidneys are unremarkable.  Urinary bladder and seminal vesicles are unremarkable. The prostate gland is mildly enlarged. The stomach, small bowel and appendix are unremarkable. No focal bony abnormality is identified.  Review of the MIP images confirms the above findings.  IMPRESSION: The study is positive for nonocclusive clot in the inferior mesenteric vein with occlusive clot seen in a venous branch to small bowel loops in the left lower quadrant. The inferior mesenteric vein partially opacifies with clot and may have nonocclusive thrombus within it.  Negative for aortic dissection or aneurysm.  Diffuse fatty infiltration of the liver.  Critical Value/emergent results were called by telephone at the time of interpretation on 10/24/2014 at 7:32 pm to Dr. Glynn Octave , who verbally acknowledged these results.   Electronically Signed   By: Drusilla Kanner M.D.   On: 10/24/2014 19:33   Ct Cta Abd/pel W/cm &/or W/o Cm  10/24/2014   CLINICAL DATA:  Left upper chest and left arm pain for 1 day. No known injury.  EXAM: CT ANGIOGRAPHY CHEST, ABDOMEN AND PELVIS  TECHNIQUE: Multidetector CT imaging through the chest, abdomen and pelvis was performed using the standard protocol during bolus administration of intravenous contrast. Multiplanar reconstructed images and MIPs were obtained and reviewed to evaluate the vascular anatomy.  CONTRAST:  100 mL OMNIPAQUE IOHEXOL 350 MG/ML SOLN  COMPARISON:  PA and lateral chest 11/17/2013 and single view of the chest earlier today.  FINDINGS: CTA CHEST FINDINGS  There is no aortic dissection or aneurysm. Bovine type aortic arch is noted. No calcific aortic or coronary atherosclerosis is identified. No pulmonary embolus is seen. There is no axillary, hilar or mediastinal lymphadenopathy. Heart size is normal. The lungs show only mild  dependent atelectatic change. No focal bony abnormality is identified.  Review of the MIP images confirms the above findings.  CTA ABDOMEN AND PELVIS FINDINGS  There is no abdominal aortic dissection or aneurysm. Major branch vessels of the aorta are widely patent. Minimal calcific atherosclerosis is identified. Nonocclusive clot is seen in the inferior mesenteric vein. Occlusive clot is seen in a branch of the inferior mesenteric vein to the left lower quadrant which appears to drain small bowel loops. The inferior mesenteric vein is also not well opacified with contrast but does not appear occluded.  There is diffuse fatty infiltration of the liver. The gallbladder, spleen, adrenal glands, pancreas and kidneys are unremarkable.  Urinary bladder and seminal vesicles are unremarkable. The prostate gland is mildly enlarged. The stomach, small bowel and appendix are unremarkable. No focal bony abnormality is identified.  Review of the MIP images confirms the above  findings.  IMPRESSION: The study is positive for nonocclusive clot in the inferior mesenteric vein with occlusive clot seen in a venous branch to small bowel loops in the left lower quadrant. The inferior mesenteric vein partially opacifies with clot and may have nonocclusive thrombus within it.  Negative for aortic dissection or aneurysm.  Diffuse fatty infiltration of the liver.  Critical Value/emergent results were called by telephone at the time of interpretation on 10/24/2014 at 7:32 pm to Dr. Glynn Octave , who verbally acknowledged these results.   Electronically Signed   By: Drusilla Kanner M.D.   On: 10/24/2014 19:33     EKG Interpretation   Date/Time:  Sunday October 24 2014 15:32:14 EST Ventricular Rate:  89 PR Interval:  138 QRS Duration: 78 QT Interval:  369 QTC Calculation: 449 R Axis:   24 Text Interpretation:  Sinus rhythm Baseline wander in lead(s) V2 No  significant change was found Confirmed by Manus Gunning  MD, Deeya Richeson [phone])  on  10/24/2014 3:38:27 PM      MDM   Final diagnoses:  Chest pain  Headache  Mesenteric vein thrombosis   Left-sided chest pain intermittent for the past 2 days. Worse with palpation and movement. EKG normal sinus rhythm. Elevated blood pressure with history of same.  EKG unchanged. Low suspicion for ACS is chest pain is sporadic and reproducible. Lasting several minutes at a time. D-dimer negative. Troponin negative.  We'll restart patient's metoprolol which he has been on previously.  No evidence of aortic dissection or aneurysm. There is incidental finding of nonocclusive inferior mesenteric vein thrombus discussed with Dr. Lovell Sheehan of Gen. surgery who recommends discussion with IR and anticoagulation. Discussed with Dr. Lowella Dandy of IR who agrees with anticoagulation and likely transfer to [facility] in case patient needs intervention.  Dw/ Dr. Lowella Dandy. He reviewed CT images. He is not confident that this represents actual thrombus. He recommends repeat CT scan tomorrow. Recommend anticoagulation overnight.  Admission discussed with Dr. Karilyn Cota.     Glynn Octave, MD 10/24/14 2157

## 2014-10-24 NOTE — Progress Notes (Signed)
ANTICOAGULATION CONSULT NOTE - Initial Consult  Pharmacy Consult for Heparin Indication: + VTE  Allergies  Allergen Reactions  . Demerol [Meperidine] Itching and Other (See Comments)    irritation    Patient Measurements: Height: 6\' 2"  (188 cm) Weight: 220 lb (99.791 kg) IBW/kg (Calculated) : 82.2 Heparin Dosing Weight: 99.8KG  Vital Signs: Temp: 98.2 F (36.8 C) (01/17 1531) Temp Source: Oral (01/17 1531) BP: 134/100 mmHg (01/17 1730) Pulse Rate: 65 (01/17 2000)  Labs:  Recent Labs  10/24/14 1535 10/24/14 1852  HGB 15.4  --   HCT 47.7  --   PLT 204  --   CREATININE 0.98  --   TROPONINI <0.03 <0.03    Estimated Creatinine Clearance: 120.1 mL/min (by C-G formula based on Cr of 0.98).   Medical History: Past Medical History  Diagnosis Date  . Hypertension   . Narcolepsy     Medications:   (Not in a hospital admission) PTA Meds reviewed.  Assessment: Okay for Protocol, positive for nonocclusive clot in the inferior mesenteric vein with occlusive clot seen in a venous branch to small bowel loops in the left lower quadrant. The inferior mesenteric vein partially opacifies with clot and may have nonocclusive thrombus within it.  Baseline coags pending.  Goal of Therapy:  Heparin level 0.3-0.7 units/ml Monitor platelets by anticoagulation protocol: Yes   Plan:  Give 5000 units bolus x 1 Start heparin infusion at 1800 units/hr Check anti-Xa level in 6-8 hours and daily while on heparin Continue to monitor H&H and platelets  Mady GemmaHayes, Jasaun Carn R 10/24/2014,8:04 PM

## 2014-10-24 NOTE — ED Notes (Signed)
MD at bedside. 

## 2014-10-24 NOTE — ED Notes (Signed)
Pt drinking water with no difficulties. Denies nausea/vomiting.

## 2014-10-24 NOTE — H&P (Signed)
[facility] History and Physical  Gary Peanony J Mcgranahan ZOX:[id number] DOB: 01-Nov-1970 DOA: 10/24/2014  Referring physician: ER PCP: Ignatius SpeckingVYAS,DHRUV B., MD   Chief Complaint: Left-sided chest pain  HPI: Gary Wyatt is a 44 y.o. male  This is a 44 year old man who had sudden onset of left-sided chest pain radiating to his back and his left arm when he woke up this morning. The episode lasted 10-15 minutes and was associated with nausea and dyspnea. Once the pain was resolved, he continued to have nausea. He also has had diarrhea but no melena or rectal bleeding. There has not been any hematemesis. He is also had some nonspecific headache. Because of unresolved symptoms of nausea, he came to the emergency room. He has no history of coronary artery disease. He is hypertensive and has not continued to take his antihypertensive medications. He is not diabetic. He is a nonsmoker. Evaluation in the emergency room with a CT angiogram of the chest and abdomen showed him to have an incidental finding it appears of nonocclusive clot in the inferior mesenteric vein with occlusive clot seen in a venous branch to small bowel loops in the left lower quadrant. Interventional radiology has been contacted who has recommended intravenous heparin tonight and then repeat CT scan of the abdomen and pelvis tomorrow. The patient denies any significant abdominal pain or rectal bleeding. He is now being admitted for further management.   Review of Systems:  Apart from symptoms above, all systems negative.    Past Medical History  Diagnosis Date  . Hypertension   . Narcolepsy    History reviewed. No pertinent past surgical history. Social History:  reports that he has never smoked. He does not have any smokeless tobacco history on file. He reports that he drinks alcohol. He reports that he does not use illicit drugs.  Allergies  Allergen Reactions  . Demerol [Meperidine] Itching and Other (See Comments)    irritation      Family history: History of thromboembolic disease in his mother.   Prior to Admission medications   Medication Sig Start Date End Date Taking? Authorizing Provider  clonazePAM (KLONOPIN) 1 MG tablet Take 1 mg by mouth at bedtime. 10/22/14  Yes Historical Provider, MD  ibuprofen (ADVIL,MOTRIN) 200 MG tablet Take 400 mg by mouth every 6 (six) hours as needed for headache.   Yes Historical Provider, MD  methylphenidate (RITALIN) 20 MG tablet Take 20 mg by mouth 2 (two) times daily.    Yes Historical Provider, MD  PARoxetine (PAXIL) 10 MG tablet Take 10 mg by mouth daily. 10/21/14  Yes Historical Provider, MD  ibuprofen (ADVIL,MOTRIN) 600 MG tablet Take 1 tablet (600 mg total) by mouth every 6 (six) hours as needed. Patient not taking: Reported on 10/24/2014 11/17/13   Loren Raceravid Yelverton, MD  methocarbamol (ROBAXIN) 500 MG tablet Take 1 tablet (500 mg total) by mouth 2 (two) times daily as needed for muscle spasms. Patient not taking: Reported on 10/24/2014 11/17/13   Loren Raceravid Yelverton, MD   Physical Exam: Filed Vitals:   10/24/14 2000 10/24/14 2015 10/24/14 2030 10/24/14 2045  BP:      Pulse: 65 73 71 65  Temp:      TempSrc:      Resp: 19 11 16 14   Height:      Weight:      SpO2: 98% 100% 99% 98%    Wt Readings from Last 3 Encounters:  10/24/14 99.791 kg (220 lb)  11/17/13 99.791 kg (220 lb)  03/25/12 99.791 kg (220 lb)    General:  Appears calm and comfortable. Does not appear to be in pain. Eyes: PERRL, normal lids, irises & conjunctiva ENT: grossly normal hearing, lips & tongue Neck: no LAD, masses or thyromegaly Cardiovascular: RRR, no m/r/g. No LE edema. Telemetry: SR, no arrhythmias  Respiratory: CTA bilaterally, no w/r/r. Normal respiratory effort. Abdomen: soft, ntnd Skin: no rash or induration seen on limited exam Musculoskeletal: grossly normal tone BUE/BLE Psychiatric: grossly normal mood and affect, speech fluent and appropriate Neurologic: grossly non-focal.           Labs on Admission:  Basic Metabolic Panel:  Recent Labs Lab 10/24/14 1535  NA 139  K 3.6  CL 106  CO2 27  GLUCOSE 124*  BUN 13  CREATININE 0.98  CALCIUM 8.8   Liver Function Tests: No results for input(s): AST, ALT, ALKPHOS, BILITOT, PROT, ALBUMIN in the last 168 hours. No results for input(s): LIPASE, AMYLASE in the last 168 hours. No results for input(s): AMMONIA in the last 168 hours. CBC:  Recent Labs Lab 10/24/14 1535  WBC 5.4  HGB 15.4  HCT 47.7  MCV 85.8  PLT 204   Cardiac Enzymes:  Recent Labs Lab 10/24/14 1535 10/24/14 1852  TROPONINI <0.03 <0.03    BNP (last 3 results) No results for input(s): PROBNP in the last 8760 hours. CBG: No results for input(s): GLUCAP in the last 168 hours.  Radiological Exams on Admission: Ct Head Wo Contrast  10/24/2014   CLINICAL DATA:  ER patient who c/o headache. Patient had a DISSECTION study done earlier for LEFT side chest pain  EXAM: CT HEAD WITHOUT CONTRAST  TECHNIQUE: Contiguous axial images were obtained from the base of the skull through the vertex without intravenous contrast.  COMPARISON:  12/18/2012  FINDINGS: Ventricles normal in size and configuration. No parenchymal masses or mass effect. There is no evidence of an infarct. No areas of abnormal parenchymal attenuation.  There are no extra-axial masses or abnormal fluid collections.  No evidence of intracranial hemorrhage.  Visualized sinuses and mastoid air cells are clear. No skull lesion.  IMPRESSION: Normal CT scan of the brain.   Electronically Signed   By: Amie Portland M.D.   On: 10/24/2014 21:17   Dg Chest Port 1 View  10/24/2014   CLINICAL DATA:  Chest pain beginning today.  EXAM: PORTABLE CHEST - 1 VIEW  COMPARISON:  PA and lateral chest 11/17/2013 and 03/25/2012.  FINDINGS: The lungs are clear. Heart size is normal. There is no pneumothorax or pleural effusion. No focal bony abnormality.  IMPRESSION: Negative chest.   Electronically Signed   By:  Drusilla Kanner M.D.   On: 10/24/2014 15:56   Ct Angio Chest Aorta W/cm &/or Wo/cm  10/24/2014   CLINICAL DATA:  Left upper chest and left arm pain for 1 day. No known injury.  EXAM: CT ANGIOGRAPHY CHEST, ABDOMEN AND PELVIS  TECHNIQUE: Multidetector CT imaging through the chest, abdomen and pelvis was performed using the standard protocol during bolus administration of intravenous contrast. Multiplanar reconstructed images and MIPs were obtained and reviewed to evaluate the vascular anatomy.  CONTRAST:  100 mL OMNIPAQUE IOHEXOL 350 MG/ML SOLN  COMPARISON:  PA and lateral chest 11/17/2013 and single view of the chest earlier today.  FINDINGS: CTA CHEST FINDINGS  There is no aortic dissection or aneurysm. Bovine type aortic arch is noted. No calcific aortic or coronary atherosclerosis is identified. No pulmonary embolus is seen. There is no axillary, hilar  or mediastinal lymphadenopathy. Heart size is normal. The lungs show only mild dependent atelectatic change. No focal bony abnormality is identified.  Review of the MIP images confirms the above findings.  CTA ABDOMEN AND PELVIS FINDINGS  There is no abdominal aortic dissection or aneurysm. Major branch vessels of the aorta are widely patent. Minimal calcific atherosclerosis is identified. Nonocclusive clot is seen in the inferior mesenteric vein. Occlusive clot is seen in a branch of the inferior mesenteric vein to the left lower quadrant which appears to drain small bowel loops. The inferior mesenteric vein is also not well opacified with contrast but does not appear occluded.  There is diffuse fatty infiltration of the liver. The gallbladder, spleen, adrenal glands, pancreas and kidneys are unremarkable.  Urinary bladder and seminal vesicles are unremarkable. The prostate gland is mildly enlarged. The stomach, small bowel and appendix are unremarkable. No focal bony abnormality is identified.  Review of the MIP images confirms the above findings.  IMPRESSION:  The study is positive for nonocclusive clot in the inferior mesenteric vein with occlusive clot seen in a venous branch to small bowel loops in the left lower quadrant. The inferior mesenteric vein partially opacifies with clot and may have nonocclusive thrombus within it.  Negative for aortic dissection or aneurysm.  Diffuse fatty infiltration of the liver.  Critical Value/emergent results were called by telephone at the time of interpretation on 10/24/2014 at 7:32 pm to Dr. Glynn Octave , who verbally acknowledged these results.   Electronically Signed   By: Drusilla Kanner M.D.   On: 10/24/2014 19:33   Ct Cta Abd/pel W/cm &/or W/o Cm  10/24/2014   CLINICAL DATA:  Left upper chest and left arm pain for 1 day. No known injury.  EXAM: CT ANGIOGRAPHY CHEST, ABDOMEN AND PELVIS  TECHNIQUE: Multidetector CT imaging through the chest, abdomen and pelvis was performed using the standard protocol during bolus administration of intravenous contrast. Multiplanar reconstructed images and MIPs were obtained and reviewed to evaluate the vascular anatomy.  CONTRAST:  100 mL OMNIPAQUE IOHEXOL 350 MG/ML SOLN  COMPARISON:  PA and lateral chest 11/17/2013 and single view of the chest earlier today.  FINDINGS: CTA CHEST FINDINGS  There is no aortic dissection or aneurysm. Bovine type aortic arch is noted. No calcific aortic or coronary atherosclerosis is identified. No pulmonary embolus is seen. There is no axillary, hilar or mediastinal lymphadenopathy. Heart size is normal. The lungs show only mild dependent atelectatic change. No focal bony abnormality is identified.  Review of the MIP images confirms the above findings.  CTA ABDOMEN AND PELVIS FINDINGS  There is no abdominal aortic dissection or aneurysm. Major branch vessels of the aorta are widely patent. Minimal calcific atherosclerosis is identified. Nonocclusive clot is seen in the inferior mesenteric vein. Occlusive clot is seen in a branch of the inferior mesenteric  vein to the left lower quadrant which appears to drain small bowel loops. The inferior mesenteric vein is also not well opacified with contrast but does not appear occluded.  There is diffuse fatty infiltration of the liver. The gallbladder, spleen, adrenal glands, pancreas and kidneys are unremarkable.  Urinary bladder and seminal vesicles are unremarkable. The prostate gland is mildly enlarged. The stomach, small bowel and appendix are unremarkable. No focal bony abnormality is identified.  Review of the MIP images confirms the above findings.  IMPRESSION: The study is positive for nonocclusive clot in the inferior mesenteric vein with occlusive clot seen in a venous branch to small bowel  loops in the left lower quadrant. The inferior mesenteric vein partially opacifies with clot and may have nonocclusive thrombus within it.  Negative for aortic dissection or aneurysm.  Diffuse fatty infiltration of the liver.  Critical Value/emergent results were called by telephone at the time of interpretation on 10/24/2014 at 7:32 pm to Dr. Glynn Octave , who verbally acknowledged these results.   Electronically Signed   By: Drusilla Kanner M.D.   On: 10/24/2014 19:33    EKG: Independently reviewed. Normal sinus rhythm without any acute ST-T wave changes.  Assessment/Plan   1. Atypical chest pain. The pain does not appear to be cardiac in nature. Troponin levels so far twice of been negative. 2. Inferior mesenteric vein thrombosis. This appears to be somewhat of an incidental finding although I suppose the nausea may be reflective of this. Interventional radiology has recommended intravenous heparin overnight and repeat CT scan of the abdomen/pelvis tomorrow. We will go ahead and do this. We will also ask the opinion of gastroenterology for further recommendations. 3. Uncontrolled hypertension. He will be started on metoprolol 25 mg twice a day. I stressed to him the importance of compliance with antihypertensive  medications.  Further recommendations will depend on patient's hospital progress.   Code Status: Full code.  DVT Prophylaxis: Intravenous heparin.  Family Communication: I discussed the plan with the patient at the bedside.   Disposition Plan: Home when medically stable.   Time spent: 60 minutes.  Wilson Singer [facility] Pager [phone].

## 2014-10-25 ENCOUNTER — Inpatient Hospital Stay (HOSPITAL_COMMUNITY): Payer: Managed Care, Other (non HMO)

## 2014-10-25 DIAGNOSIS — R519 Headache, unspecified: Secondary | ICD-10-CM | POA: Diagnosis present

## 2014-10-25 DIAGNOSIS — I10 Essential (primary) hypertension: Secondary | ICD-10-CM

## 2014-10-25 DIAGNOSIS — R51 Headache: Secondary | ICD-10-CM

## 2014-10-25 DIAGNOSIS — R0789 Other chest pain: Principal | ICD-10-CM

## 2014-10-25 LAB — COMPREHENSIVE METABOLIC PANEL
ALT: 24 U/L (ref 0–53)
AST: 18 U/L (ref 0–37)
Albumin: 3.5 g/dL (ref 3.5–5.2)
Alkaline Phosphatase: 42 U/L (ref 39–117)
Anion gap: 5 (ref 5–15)
BUN: 13 mg/dL (ref 6–23)
CALCIUM: 8.3 mg/dL — AB (ref 8.4–10.5)
CO2: 27 mmol/L (ref 19–32)
CREATININE: 0.92 mg/dL (ref 0.50–1.35)
Chloride: 104 mEq/L (ref 96–112)
GFR calc non Af Amer: >90 mL/min (ref >90)
Glucose, Bld: 138 mg/dL — ABNORMAL HIGH (ref 70–99)
POTASSIUM: 3.4 mmol/L — AB (ref 3.5–5.1)
SODIUM: 136 mmol/L (ref 135–145)
TOTAL PROTEIN: 5.7 g/dL — AB (ref 6.0–8.3)
Total Bilirubin: 0.5 mg/dL (ref 0.3–1.2)

## 2014-10-25 LAB — CBC
HEMATOCRIT: 45.3 % (ref 39.0–52.0)
HEMOGLOBIN: 15 g/dL (ref 13.0–17.0)
MCH: 28.7 pg (ref 26.0–34.0)
MCHC: 33.1 g/dL (ref 30.0–36.0)
MCV: 86.6 fL (ref 78.0–100.0)
PLATELETS: 201 10*3/uL (ref 150–400)
RBC: 5.23 MIL/uL (ref 4.22–5.81)
RDW: 13.6 % (ref 11.5–15.5)
WBC: 7 10*3/uL (ref 4.0–10.5)

## 2014-10-25 LAB — TROPONIN I
Troponin I: <0.03 ng/mL (ref <0.031)
Troponin I: <0.03 ng/mL (ref <0.031)

## 2014-10-25 LAB — HEPARIN LEVEL (UNFRACTIONATED): HEPARIN UNFRACTIONATED: 0.56 [IU]/mL (ref 0.30–0.70)

## 2014-10-25 MED ORDER — POLYETHYLENE GLYCOL 3350 17 G PO PACK: 17.0000 g | PACK | Freq: Every day | ORAL | Status: DC | PRN

## 2014-10-25 MED ORDER — POTASSIUM CHLORIDE CRYS ER 20 MEQ PO TBCR: 40.0000 meq | EXTENDED_RELEASE_TABLET | Freq: Once | ORAL | Status: DC

## 2014-10-25 MED ORDER — IOHEXOL 350 MG/ML SOLN
100.0000 mL | Freq: Once | INTRAVENOUS | Status: AC | PRN
  Administered 2014-10-25: 100 mL via INTRAVENOUS

## 2014-10-25 MED ORDER — METOPROLOL TARTRATE 25 MG PO TABS: 37.5000 mg | ORAL_TABLET | Freq: Two times a day (BID) | ORAL | Status: DC

## 2014-10-25 NOTE — Progress Notes (Signed)
GI consult request noted. While reviewing the chart in preparation for seeing the patient, I noted the repeat CTA (read by Dr. Grace IsaacWatts with IR), noted normal opacification of the mesenteric veins, with the previously question SMV/IMV thrombosis on CTA from 1/17 felt to be artifactual secondary to enhancement phase and mixing artifact of opacified and un-opacified mesenteric venous blood. Admitting chief complaint of left-sided chest pain, nausea. No reports of abdominal pain in the record. I spoke with Dr. Kerry HoughMemon regarding case. As it is felt the original reading was artifactual, no need for GI consultation. However, if any GI concerns, we will gladly see patient while inpatient. Consult will be cancelled for now. Nira RetortAnna W. Sams, ANP-BC [facility]

## 2014-10-25 NOTE — Progress Notes (Signed)
ANTICOAGULATION CONSULT NOTE - Initial Consult  Pharmacy Consult for Heparin Indication: + VTE  Allergies  Allergen Reactions  . Demerol [Meperidine] Itching and Other (See Comments)    irritation   Patient Measurements: Height: 6\' 2"  (188 cm) Weight: 220 lb (99.791 kg) IBW/kg (Calculated) : 82.2 Heparin Dosing Weight: 99.8KG  Vital Signs: Temp: 97.9 F (36.6 C) (01/18 0615) Temp Source: Oral (01/18 0615) BP: 137/92 mmHg (01/18 0615) Pulse Rate: 68 (01/18 0615)  Labs:  Recent Labs  10/24/14 1535  10/24/14 1900 10/24/14 2136 10/25/14 0343 10/25/14 0934  HGB 15.4  --   --   --  15.0  --   HCT 47.7  --   --   --  45.3  --   PLT 204  --   --   --  201  --   LABPROT  --   --  12.9  --   --   --   INR  --   --  0.97  --   --   --   HEPARINUNFRC  --   --   --   --  0.56  --   CREATININE 0.98  --   --   --  0.92  --   TROPONINI <0.03  < >  --  <0.03 <0.03 <0.03  < > = values in this interval not displayed.  Estimated Creatinine Clearance: 127.9 mL/min (by C-G formula based on Cr of 0.92).  Medical History: Past Medical History  Diagnosis Date  . Hypertension   . Narcolepsy   . Depression   . Anxiety   . Insomnia    Medications:  Prescriptions prior to admission  Medication Sig Dispense Refill Last Dose  . clonazePAM (KLONOPIN) 1 MG tablet Take 1 mg by mouth at bedtime.   10/24/2014 at Unknown time  . ibuprofen (ADVIL,MOTRIN) 200 MG tablet Take 400 mg by mouth every 6 (six) hours as needed for headache.   10/24/2014 at Unknown time  . ibuprofen (ADVIL,MOTRIN) 600 MG tablet Take 1 tablet (600 mg total) by mouth every 6 (six) hours as needed. 30 tablet 0 10/24/2014 at Unknown time  . methocarbamol (ROBAXIN) 500 MG tablet Take 1 tablet (500 mg total) by mouth 2 (two) times daily as needed for muscle spasms. 20 tablet 0 10/24/2014 at Unknown time  . methylphenidate (RITALIN) 20 MG tablet Take 20 mg by mouth 2 (two) times daily.    10/24/2014 at Unknown time  . PARoxetine  (PAXIL) 10 MG tablet Take 10 mg by mouth daily.   10/24/2014 at Unknown time   PTA Meds reviewed.  Assessment: Okay for Protocol, positive for nonocclusive clot in the inferior mesenteric vein with occlusive clot seen in a venous branch to small bowel loops in the left lower quadrant. The inferior mesenteric vein partially opacifies with clot and may have nonocclusive thrombus within it.  Heparin level is therapeutic.  No bleeding note.  PLTs are OK.  Goal of Therapy:  Heparin level 0.3-0.7 units/ml Monitor platelets by anticoagulation protocol: Yes   Plan:  Continue Heparin at current rate Heparin level and CBC daily while on Heparin  Gary Wyatt A 10/25/2014,10:18 AM

## 2014-10-25 NOTE — Care Management Note (Signed)
    Page 1 of 1   10/25/2014     11:21:05 AM CARE MANAGEMENT NOTE 10/25/2014  Patient:  Gary Wyatt,Gary Wyatt   Account Number:  [id number]  Date Initiated:  10/25/2014  Documentation initiated by:  Sharrie RothmanBLACKWELL,Arnelle Nale C  Subjective/Objective Assessment:   Pt admitted from home with mesenteric thrombposis. Pt lives alone and will return home at discharge. Pt is independent with ADL's.     Action/Plan:   Will do benefits check once discharge regimine is established for possible eliquis or xarelto.   Anticipated DC Date:  10/27/2014   Anticipated DC Plan:  HOME/SELF CARE      DC Planning Services  CM consult      Choice offered to / List presented to:             Status of service:  Completed, signed off Medicare Important Message given?   (If response is "NO", the following Medicare IM given date fields will be blank) Date Medicare IM given:   Medicare IM given by:   Date Additional Medicare IM given:   Additional Medicare IM given by:    Discharge Disposition:  HOME/SELF CARE  Per UR Regulation:    If discussed at Long Length of Stay Meetings, dates discussed:    Comments:  10/25/14 1120 Gary Queenammy Ola Raap, RN BSN CM

## 2014-10-25 NOTE — Progress Notes (Signed)
UR chart review completed.  

## 2014-10-25 NOTE — Discharge Summary (Signed)
Physician Discharge Summary  Gary Wyatt ZOX:[id number] DOB: 1971/01/06 DOA: 10/24/2014  PCP: [name]., MD  Admit date: 10/24/2014 Discharge date: 10/25/2014  Time spent:  Recommendations for Outpatient Follow-up:  1. Patient will follow up with primary care physician for further management of his blood pressure.  Discharge Diagnoses:  Active Problems:   Atypical chest pain   Hypertension   Headache   Discharge Condition: improved  Diet recommendation: low salt  Filed Weights   10/24/14 1531  Weight: 99.791 kg (220 lb)    History of present illness and hospital course:  This patient was admitted to the hospital with complaints of headache, and he was noted to be hypertensive. He also complained of some vague left-sided chest pain which appeared to be atypical. The patient is on antihypertensives at home, but has reported that he has not taken them in some time. He was evaluated in the emergency room where EKG was nonacute and cardiac enzymes are negative. CT angiogram of the chest and abdomen showed an incidental finding of a possible nonocclusive clot in the inferior mesenteric vein with occlusive clot seen in the venous branch small bowel loops left lower quadrant. Case was discussed with interventional radiology who recommended the patient receive a heparin drip overnight and repeat CT scan of the abdomen and pelvis to be conducted in the morning. The patient had a CT scan done the following morning and it was felt that the previously seen mesenteric thrombosis was likely an artifact. Patient is feeling quite well. He is tolerating a diet, does not have any abdominal pain or vomiting. His headache has resolved and his blood pressure stable. He has not had any recurrence of chest pain. She is to discharge home. He appears stable at this time. No further inpatient workup planned, so he can follow-up with his primary care  physician.   Procedures:    Consultations:    Discharge Exam: Filed Vitals:   10/25/14 1339  BP: 146/93  Pulse: 78  Temp: 98.3 F (36.8 C)  Resp: 20    General: NAD Cardiovascular: S1, S2 RRR Respiratory: CTA B  Discharge Instructions   Discharge Instructions    Call MD for:  persistant nausea and vomiting    Complete by:  As directed      Call MD for:  temperature >100.4    Complete by:  As directed      Diet - low sodium heart healthy    Complete by:  As directed      Increase activity slowly    Complete by:  As directed           Discharge Medication List as of 10/25/2014  5:21 PM    START taking these medications   Details  metoprolol tartrate (LOPRESSOR) 25 MG tablet Take 1.5 tablets (37.5 mg total) by mouth 2 (two) times daily., Starting 10/25/2014, Until Discontinued, No Print    polyethylene glycol (MIRALAX) packet Take 17 g by mouth daily as needed for mild constipation., Starting 10/25/2014, Until Discontinued, Print      CONTINUE these medications which have NOT CHANGED   Details  clonazePAM (KLONOPIN) 1 MG tablet Take 1 mg by mouth at bedtime., Starting 10/22/2014, Until Discontinued, Historical Med    ibuprofen (ADVIL,MOTRIN) 200 MG tablet Take 400 mg by mouth every 6 (six) hours as needed for headache., Until Discontinued, Historical Med    methocarbamol (ROBAXIN) 500 MG tablet Take 1 tablet (500 mg total) by mouth 2 (two) times daily  as needed for muscle spasms., Starting 11/17/2013, Until Discontinued, Print    methylphenidate (RITALIN) 20 MG tablet Take 20 mg by mouth 2 (two) times daily. , Until Discontinued, Historical Med    PARoxetine (PAXIL) 10 MG tablet Take 10 mg by mouth daily., Starting 10/21/2014, Until Discontinued, Historical Med       Allergies  Allergen Reactions  . Demerol [Meperidine] Itching and Other (See Comments)    irritation   Follow-up Information    Follow up with VYAS,DHRUV B., MD. Schedule an appointment as soon  as possible for a visit in 2 weeks.   Specialty:  Internal Medicine   Contact information:   [address] [place] [address] [phone]        The results of significant diagnostics from this hospitalization (including imaging, microbiology, ancillary and laboratory) are listed below for reference.    Significant Diagnostic Studies: Ct Head Wo Contrast  10/24/2014   CLINICAL DATA:  ER patient who c/o headache. Patient had a DISSECTION study done earlier for LEFT side chest pain  EXAM: CT HEAD WITHOUT CONTRAST  TECHNIQUE: Contiguous axial images were obtained from the base of the skull through the vertex without intravenous contrast.  COMPARISON:  12/18/2012  FINDINGS: Ventricles normal in size and configuration. No parenchymal masses or mass effect. There is no evidence of an infarct. No areas of abnormal parenchymal attenuation.  There are no extra-axial masses or abnormal fluid collections.  No evidence of intracranial hemorrhage.  Visualized sinuses and mastoid air cells are clear. No skull lesion.  IMPRESSION: Normal CT scan of the brain.   Electronically Signed   By: Amie Portland M.D.   On: 10/24/2014 21:17   Dg Chest Port 1 View  10/24/2014   CLINICAL DATA:  Chest pain beginning today.  EXAM: PORTABLE CHEST - 1 VIEW  COMPARISON:  PA and lateral chest 11/17/2013 and 03/25/2012.  FINDINGS: The lungs are clear. Heart size is normal. There is no pneumothorax or pleural effusion. No focal bony abnormality.  IMPRESSION: Negative chest.   Electronically Signed   By: Drusilla Kanner M.D.   On: 10/24/2014 15:56   Ct Angio Chest Aorta W/cm &/or Wo/cm  10/24/2014   CLINICAL DATA:  Left upper chest and left arm pain for 1 day. No known injury.  EXAM: CT ANGIOGRAPHY CHEST, ABDOMEN AND PELVIS  TECHNIQUE: Multidetector CT imaging through the chest, abdomen and pelvis was performed using the standard protocol during bolus administration of intravenous contrast. Multiplanar reconstructed images and MIPs  were obtained and reviewed to evaluate the vascular anatomy.  CONTRAST:  100 mL OMNIPAQUE IOHEXOL 350 MG/ML SOLN  COMPARISON:  PA and lateral chest 11/17/2013 and single view of the chest earlier today.  FINDINGS: CTA CHEST FINDINGS  There is no aortic dissection or aneurysm. Bovine type aortic arch is noted. No calcific aortic or coronary atherosclerosis is identified. No pulmonary embolus is seen. There is no axillary, hilar or mediastinal lymphadenopathy. Heart size is normal. The lungs show only mild dependent atelectatic change. No focal bony abnormality is identified.  Review of the MIP images confirms the above findings.  CTA ABDOMEN AND PELVIS FINDINGS  There is no abdominal aortic dissection or aneurysm. Major branch vessels of the aorta are widely patent. Minimal calcific atherosclerosis is identified. Nonocclusive clot is seen in the inferior mesenteric vein. Occlusive clot is seen in a branch of the inferior mesenteric vein to the left lower quadrant which appears to drain small bowel loops. The inferior mesenteric  vein is also not well opacified with contrast but does not appear occluded.  There is diffuse fatty infiltration of the liver. The gallbladder, spleen, adrenal glands, pancreas and kidneys are unremarkable.  Urinary bladder and seminal vesicles are unremarkable. The prostate gland is mildly enlarged. The stomach, small bowel and appendix are unremarkable. No focal bony abnormality is identified.  Review of the MIP images confirms the above findings.  IMPRESSION: The study is positive for nonocclusive clot in the inferior mesenteric vein with occlusive clot seen in a venous branch to small bowel loops in the left lower quadrant. The inferior mesenteric vein partially opacifies with clot and may have nonocclusive thrombus within it.  Negative for aortic dissection or aneurysm.  Diffuse fatty infiltration of the liver.  Critical Value/emergent results were called by telephone at the time of  interpretation on 10/24/2014 at 7:32 pm to Dr. Glynn Octave , who verbally acknowledged these results.   Electronically Signed   By: Drusilla Kanner M.D.   On: 10/24/2014 19:33   Ct Angio Abd/pel W/ And/or W/o  10/25/2014   CLINICAL DATA:  Mesenteric thrombosis. History of nonocclusive clot within in the inferior mesenteric vein.  EXAM: CT ANGIOGRAPHY ABDOMEN AND PELVIS WITH CONTRAST AND WITHOUT CONTRAST  TECHNIQUE: Multidetector CT imaging of the abdomen and pelvis was performed using the standard protocol during bolus administration of intravenous contrast. Multiplanar reconstructed images including MIPs were obtained and reviewed to evaluate the vascular anatomy.  CONTRAST:  OMNIPAQUE IOHEXOL 350 MG/ML SOLN  COMPARISON:  CT abdomen pelvis - 10/24/2014  FINDINGS: Vascular Findings:  Abdominal aorta: There is a very minimal amount of mixed eccentric calcified and noncalcified atherosclerotic plaque with a normal caliber abdominal aorta, not resulting in hemodynamically significant stenosis. No abdominal aortic dissection or periaortic stranding.  Celiac artery: Widely patent without AA hemodynamically significant narrowing. Conventional branching pattern.  SMA: Widely patent without hemodynamically significant narrowing. A replaced right hepatic artery is incidentally noted to arise from the proximal SMA.  Right Renal artery: There are 3 discrete right-sided renal arteries, including codominant right-sided renal arteries supplying the superior and inferior poles of the right kidney and a tiny renal artery supplying the interpolar aspect of of the right kidney. All right-sided renal arteries are widely patent without hemodynamically significant narrowing.  Left Renal artery: There are 3 discrete left-sided renal arteries. Code dominant renal arteries supply the superior and inferior pole of the left kidney with a tiny accessory right-sided renal artery which supplies additional arterial flow to the  inferior pole. All left-sided renal artery is are widely patent without hemodynamically significant narrowing.  IMA: Widely patent without hemodynamically significant narrowing.  Pelvic vasculature: There is a very minimal amount of eccentric mixed calcified and noncalcified atherosclerotic plaque within the distal aspect of the bilateral common iliac arteries extending to involve the proximal aspects of the bilateral internal iliac arteries, not resulting in hemodynamically significant stenosis. The bilateral common and external iliac arteries are mildly tortuous but widely patent without hemodynamically significant stenosis. The bilateral internal iliac arteries are mildly diseased but widely patent and of normal caliber.  Venous Findings: The previously questioned nonocclusive thrombus within the SMV extending to several branch vessels of the left lower abdominal quadrant is not confirmed on present examination with normal homogeneous opacification all of the mesenteric veins on the acquired portal venous phase series (series 9).  Review of the MIP images confirms the above findings.   --------------------------------------------------------------------------------  Nonvascular Findings:  Normal hepatic contour. No discrete  hepatic lesions. Normal appearance of the gallbladder given degree distention. No radiopaque gallstones. No intra or extrahepatic biliary duct dilatation. No ascites.  There is symmetric enhancement of the bilateral kidneys. Note is made of a punctate (approximately 2 mm) nonobstructing stone within the interpolar aspect the right kidney (image 60, series 5). No discrete renal lesions. No urinary destruction or perinephric stranding. Normal appearance of the bilateral adrenal glands, pancreas and spleen.  Moderate colonic stool burden without evidence of obstruction. The bowel is normal in course and caliber without wall thickening. Normal appearance of the appendix. No pneumoperitoneum,  pneumatosis or portal venous gas.  No retroperitoneal, mesenteric, pelvic or inguinal lymphadenopathy.  The prostate is borderline enlarged. Normal appearance of the urinary bladder given degree distention. No free fluid in the pelvic cul-de-sac.  Limited visualization of the lower thorax demonstrates minimal symmetric subpleural ground-glass atelectasis. No discrete focal airspace opacities. No pleural effusion.  Borderline cardiomegaly.  No pericardial effusion.  No acute or aggressive osseus abnormalities.  A punctate (approximately 0.6 cm) calcification is noted within the left side of the base of the penis (image 171, series 5). Regional soft tissues appear otherwise normal. No radiopaque foreign body.  IMPRESSION: 1. Normal opacification of the mesenteric veins - as such, the previously questioned SMV/IMV thrombosis on prior examination performed 1/17 is felt to have been artifactual secondary to phase of enhancement and mixing artifact of opacified and un-opacified mesenteric venous blood. 2. Scattered minimal amount of mixed calcified and noncalcified atherosclerotic plaque within a normal caliber abdominal aorta. 3. Punctate (2 mm) nonobstructing right-sided renal stone.   Electronically Signed   By: Simonne Come M.D.   On: 10/25/2014 13:05   Ct Cta Abd/pel W/cm &/or W/o Cm  10/24/2014   CLINICAL DATA:  Left upper chest and left arm pain for 1 day. No known injury.  EXAM: CT ANGIOGRAPHY CHEST, ABDOMEN AND PELVIS  TECHNIQUE: Multidetector CT imaging through the chest, abdomen and pelvis was performed using the standard protocol during bolus administration of intravenous contrast. Multiplanar reconstructed images and MIPs were obtained and reviewed to evaluate the vascular anatomy.  CONTRAST:  100 mL OMNIPAQUE IOHEXOL 350 MG/ML SOLN  COMPARISON:  PA and lateral chest 11/17/2013 and single view of the chest earlier today.  FINDINGS: CTA CHEST FINDINGS  There is no aortic dissection or aneurysm. Bovine type  aortic arch is noted. No calcific aortic or coronary atherosclerosis is identified. No pulmonary embolus is seen. There is no axillary, hilar or mediastinal lymphadenopathy. Heart size is normal. The lungs show only mild dependent atelectatic change. No focal bony abnormality is identified.  Review of the MIP images confirms the above findings.  CTA ABDOMEN AND PELVIS FINDINGS  There is no abdominal aortic dissection or aneurysm. Major branch vessels of the aorta are widely patent. Minimal calcific atherosclerosis is identified. Nonocclusive clot is seen in the inferior mesenteric vein. Occlusive clot is seen in a branch of the inferior mesenteric vein to the left lower quadrant which appears to drain small bowel loops. The inferior mesenteric vein is also not well opacified with contrast but does not appear occluded.  There is diffuse fatty infiltration of the liver. The gallbladder, spleen, adrenal glands, pancreas and kidneys are unremarkable.  Urinary bladder and seminal vesicles are unremarkable. The prostate gland is mildly enlarged. The stomach, small bowel and appendix are unremarkable. No focal bony abnormality is identified.  Review of the MIP images confirms the above findings.  IMPRESSION: The study is positive for nonocclusive  clot in the inferior mesenteric vein with occlusive clot seen in a venous branch to small bowel loops in the left lower quadrant. The inferior mesenteric vein partially opacifies with clot and may have nonocclusive thrombus within it.  Negative for aortic dissection or aneurysm.  Diffuse fatty infiltration of the liver.  Critical Value/emergent results were called by telephone at the time of interpretation on 10/24/2014 at 7:32 pm to Dr. Glynn OctaveSTEPHEN RANCOUR , who verbally acknowledged these results.   Electronically Signed   By: Drusilla Kannerhomas  Dalessio M.D.   On: 10/24/2014 19:33    Microbiology: No results found for this or any previous visit (from the past 240 hour(s)).   Labs: Basic  Metabolic Panel:  Recent Labs Lab 10/24/14 1535 10/25/14 0343  NA 139 136  K 3.6 3.4*  CL 106 104  CO2 27 27  GLUCOSE 124* 138*  BUN 13 13  CREATININE 0.98 0.92  CALCIUM 8.8 8.3*   Liver Function Tests:  Recent Labs Lab 10/25/14 0343  AST 18  ALT 24  ALKPHOS 42  BILITOT 0.5  PROT 5.7*  ALBUMIN 3.5   No results for input(s): LIPASE, AMYLASE in the last 168 hours. No results for input(s): AMMONIA in the last 168 hours. CBC:  Recent Labs Lab 10/24/14 1535 10/25/14 0343  WBC 5.4 7.0  HGB 15.4 15.0  HCT 47.7 45.3  MCV 85.8 86.6  PLT 204 201   Cardiac Enzymes:  Recent Labs Lab 10/24/14 1535 10/24/14 1852 10/24/14 2136 10/25/14 0343 10/25/14 0934  TROPONINI <0.03 <0.03 <0.03 <0.03 <0.03   BNP: BNP (last 3 results) No results for input(s): PROBNP in the last 8760 hours. CBG: No results for input(s): GLUCAP in the last 168 hours.     Signed:  MEMON,JEHANZEB  [facility] 10/25/2014, 7:18 PM

## 2014-10-25 NOTE — Clinical Social Work Note (Signed)
CSW received consult, with no stated reason. Reviewed chart and no CSW needs apparent. Will sign off, but can be reconsulted if needed.  Derenda FennelKara Yarah Fuente, [place] [phone]

## 2015-01-24 ENCOUNTER — Emergency Department (HOSPITAL_COMMUNITY): Payer: Managed Care, Other (non HMO)

## 2015-01-24 ENCOUNTER — Emergency Department (HOSPITAL_COMMUNITY)
Admission: EM | Admit: 2015-01-24 | Discharge: 2015-01-25 | Disposition: A | Discharge status: Home / Self Care | Payer: Managed Care, Other (non HMO) | Attending: Emergency Medicine | Admitting: Emergency Medicine

## 2015-01-24 ENCOUNTER — Encounter (HOSPITAL_COMMUNITY): Payer: Self-pay | Admitting: *Deleted

## 2015-01-24 DIAGNOSIS — Z87442 Personal history of urinary calculi: Secondary | ICD-10-CM | POA: Diagnosis not present

## 2015-01-24 DIAGNOSIS — F329 Major depressive disorder, single episode, unspecified: Secondary | ICD-10-CM | POA: Diagnosis not present

## 2015-01-24 DIAGNOSIS — F419 Anxiety disorder, unspecified: Secondary | ICD-10-CM | POA: Diagnosis not present

## 2015-01-24 DIAGNOSIS — Z79899 Other long term (current) drug therapy: Secondary | ICD-10-CM | POA: Diagnosis not present

## 2015-01-24 DIAGNOSIS — I10 Essential (primary) hypertension: Secondary | ICD-10-CM | POA: Insufficient documentation

## 2015-01-24 DIAGNOSIS — N201 Calculus of ureter: Secondary | ICD-10-CM | POA: Insufficient documentation

## 2015-01-24 DIAGNOSIS — R109 Unspecified abdominal pain: Secondary | ICD-10-CM | POA: Diagnosis present

## 2015-01-24 DIAGNOSIS — Z8669 Personal history of other diseases of the nervous system and sense organs: Secondary | ICD-10-CM | POA: Insufficient documentation

## 2015-01-24 HISTORY — DX: Calculus of kidney: N20.0

## 2015-01-24 LAB — CBC WITH DIFFERENTIAL/PLATELET
BASOS PCT: 0 % (ref 0–1)
Basophils Absolute: 0 10*3/uL (ref 0.0–0.1)
EOS ABS: 0.2 10*3/uL (ref 0.0–0.7)
Eosinophils Relative: 3 % (ref 0–5)
HEMATOCRIT: 47.5 % (ref 39.0–52.0)
Hemoglobin: 16 g/dL (ref 13.0–17.0)
Lymphocytes Relative: 13 % (ref 12–46)
Lymphs Abs: 1 10*3/uL (ref 0.7–4.0)
MCH: 29.2 pg (ref 26.0–34.0)
MCHC: 33.7 g/dL (ref 30.0–36.0)
MCV: 86.7 fL (ref 78.0–100.0)
MONO ABS: 0.5 10*3/uL (ref 0.1–1.0)
Monocytes Relative: 6 % (ref 3–12)
Neutro Abs: 6.2 10*3/uL (ref 1.7–7.7)
Neutrophils Relative %: 78 % — ABNORMAL HIGH (ref 43–77)
Platelets: 188 10*3/uL (ref 150–400)
RBC: 5.48 MIL/uL (ref 4.22–5.81)
RDW: 13.2 % (ref 11.5–15.5)
WBC: 8 10*3/uL (ref 4.0–10.5)

## 2015-01-24 LAB — URINE MICROSCOPIC-ADD ON

## 2015-01-24 LAB — BASIC METABOLIC PANEL
Anion gap: 9 (ref 5–15)
BUN: 15 mg/dL (ref 6–23)
CO2: 27 mmol/L (ref 19–32)
Calcium: 9.1 mg/dL (ref 8.4–10.5)
Chloride: 104 mmol/L (ref 96–112)
Creatinine, Ser: 1.03 mg/dL (ref 0.50–1.35)
GFR calc Af Amer: >90 mL/min (ref >90)
GFR calc non Af Amer: 85 mL/min — ABNORMAL LOW (ref >90)
GLUCOSE: 128 mg/dL — AB (ref 70–99)
Potassium: 3.6 mmol/L (ref 3.5–5.1)
Sodium: 140 mmol/L (ref 135–145)

## 2015-01-24 LAB — URINALYSIS, ROUTINE W REFLEX MICROSCOPIC
BILIRUBIN URINE: NEGATIVE
Glucose, UA: NEGATIVE mg/dL
KETONES UR: NEGATIVE mg/dL
Leukocytes, UA: NEGATIVE
NITRITE: NEGATIVE
SPECIFIC GRAVITY, URINE: 1.025 (ref 1.005–1.030)
Urobilinogen, UA: 0.2 mg/dL (ref 0.0–1.0)
pH: 6 (ref 5.0–8.0)

## 2015-01-24 MED ORDER — HYDROMORPHONE HCL 1 MG/ML IJ SOLN
1.0000 mg | Freq: Once | INTRAMUSCULAR | Status: AC
  Administered 2015-01-24: 1 mg via INTRAVENOUS
  Filled 2015-01-24: qty 1

## 2015-01-24 MED ORDER — HYDROMORPHONE HCL 1 MG/ML IJ SOLN
1.0000 mg | Freq: Once | INTRAMUSCULAR | Status: AC
Start: 2015-01-24 — End: 2015-01-24
  Administered 2015-01-24: 1 mg via INTRAVENOUS
  Filled 2015-01-24: qty 1

## 2015-01-24 MED ORDER — ONDANSETRON HCL 4 MG/2ML IJ SOLN
4.0000 mg | Freq: Once | INTRAMUSCULAR | Status: AC
  Administered 2015-01-24: 4 mg via INTRAVENOUS
  Filled 2015-01-24: qty 2

## 2015-01-24 MED ORDER — KETOROLAC TROMETHAMINE 30 MG/ML IJ SOLN
30.0000 mg | Freq: Once | INTRAMUSCULAR | Status: AC
  Administered 2015-01-24: 30 mg via INTRAVENOUS
  Filled 2015-01-24: qty 1

## 2015-01-24 NOTE — ED Provider Notes (Signed)
CSN: [id number]     Arrival date & time 01/24/15  1947 History   First MD Initiated Contact with Patient 01/24/15 2022     Chief Complaint  Patient presents with  . Flank Pain     (Consider location/radiation/quality/duration/timing/severity/associated sxs/prior Treatment) Patient is a 44 y.o. male presenting with flank pain. The history is provided by the patient.  Flank Pain This is a new problem. Associated symptoms include abdominal pain. Pertinent negatives include no chest pain, no headaches and no shortness of breath.   patient developed right flank pain today. States it started in the back and has worked its way down more. No fevers. States it feels as if he has to urinate but can't. States his abdomen may be slightly more swollen than normal. He is vomited a couple times also. Previous history kidney stones. Had CT scan done 4 months ago that showed a 2 mm right renal stone. Past Medical History  Diagnosis Date  . Hypertension   . Narcolepsy   . Depression   . Anxiety   . Insomnia   . Kidney stone    History reviewed. No pertinent past surgical history. History reviewed. No pertinent family history. History  Substance Use Topics  . Smoking status: Never Smoker   . Smokeless tobacco: Current User    Types: Snuff  . Alcohol Use: No     Comment: ocassionally    Review of Systems  Constitutional: Negative for fever, activity change and appetite change.  Eyes: Negative for pain.  Respiratory: Negative for chest tightness and shortness of breath.   Cardiovascular: Negative for chest pain and leg swelling.  Gastrointestinal: Positive for nausea, vomiting and abdominal pain. Negative for diarrhea.  Genitourinary: Positive for dysuria, flank pain and difficulty urinating.  Musculoskeletal: Negative for back pain and neck stiffness.  Skin: Negative for rash.  Neurological: Negative for weakness, numbness and headaches.  Psychiatric/Behavioral: Negative for behavioral  problems.      Allergies  Demerol  Home Medications   Prior to Admission medications   Medication Sig Start Date End Date Taking? Authorizing Provider  clonazePAM (KLONOPIN) 1 MG tablet Take 1 mg by mouth at bedtime. 10/22/14  Yes Historical Provider, MD  hydrochlorothiazide (HYDRODIURIL) 25 MG tablet Take 25 mg by mouth daily. 01/24/15  Yes Historical Provider, MD  methylphenidate (RITALIN) 20 MG tablet Take 20 mg by mouth 2 (two) times daily.    Yes Historical Provider, MD  PARoxetine (PAXIL) 20 MG tablet Take 20 mg by mouth daily. 01/24/15  Yes Historical Provider, MD  ibuprofen (ADVIL,MOTRIN) 200 MG tablet Take 400 mg by mouth every 6 (six) hours as needed for headache.    Historical Provider, MD  methocarbamol (ROBAXIN) 500 MG tablet Take 1 tablet (500 mg total) by mouth 2 (two) times daily as needed for muscle spasms. Patient not taking: Reported on 01/24/2015 11/17/13   Loren Raceravid Yelverton, MD  metoprolol tartrate (LOPRESSOR) 25 MG tablet Take 1.5 tablets (37.5 mg total) by mouth 2 (two) times daily. Patient not taking: Reported on 01/24/2015 10/25/14   Erick BlinksJehanzeb Memon, MD  oxyCODONE-acetaminophen (PERCOCET/ROXICET) 5-325 MG per tablet Take 2 tablets by mouth every 4 (four) hours as needed for severe pain. 01/25/15   Benjiman CoreNathan Devonne Kitchen, MD  oxyCODONE-acetaminophen (PERCOCET/ROXICET) 5-325 MG per tablet Take 1-2 tablets by mouth every 6 (six) hours as needed. 01/25/15   Benjiman CoreNathan Shametra Cumberland, MD  polyethylene glycol [facility](MIRALAX) packet Take 17 g by mouth daily as needed for mild constipation. Patient not taking: Reported on 01/24/2015  10/25/14   Erick Blinks, MD   BP 154/101 mmHg  Pulse 63  Temp(Src) 98.5 F (36.9 C) (Oral)  Resp 20  Ht  (1.88 m)  Wt 215 lb (97.523 kg)  BMI 27.59 kg/m2  SpO2 94% Physical Exam  Constitutional: He appears well-developed and well-nourished.  HENT:  Head: Normocephalic.  Eyes: Pupils are equal, round, and reactive to light.  Neck: Neck supple.   Cardiovascular: Normal rate and regular rhythm.   Pulmonary/Chest: Effort normal.  Abdominal: Soft. He exhibits distension. There is tenderness.  There may be mild distention. Tenderness worse on the right side. No hernias palpated.  Genitourinary:  CVA tenderness on right.  Musculoskeletal: Normal range of motion.  Neurological: He is alert.    ED Course  Procedures (including critical care time) Labs Review Labs Reviewed  CBC WITH DIFFERENTIAL/PLATELET - Abnormal; Notable for the following:    Neutrophils Relative % 78 (*)    All other components within normal limits  BASIC METABOLIC PANEL - Abnormal; Notable for the following:    Glucose, Bld 128 (*)    GFR calc non Af Amer 85 (*)    All other components within normal limits  URINALYSIS, ROUTINE W REFLEX MICROSCOPIC - Abnormal; Notable for the following:    Hgb urine dipstick LARGE (*)    Protein, ur TRACE (*)    All other components within normal limits  URINE MICROSCOPIC-ADD ON - Abnormal; Notable for the following:    Squamous Epithelial / LPF FEW (*)    Bacteria, UA MANY (*)    All other components within normal limits  URINE CULTURE    Imaging Review Dg Abd 1 View  01/24/2015   CLINICAL DATA:  Right flank pain for 2 hr with nausea and vomiting. History of kidney stones.  EXAM: ABDOMEN - 1 VIEW  COMPARISON:  CT abdomen and pelvis 10/25/2014  FINDINGS: No radiopaque calculi are identified overlying either kidney or along the expected courses of the ureters. No definite calculi are noted projecting over the bladder either. No dilated loops of bowel are seen. No acute osseous abnormality.  IMPRESSION: No radiopaque urinary tract calculi identified.   Electronically Signed   By: Sebastian Ache   On: 01/24/2015 21:56     EKG Interpretation None      MDM   Final diagnoses:  Right ureteral stone    Patient with flank pain. Recent CT showed 2 mm renal stone. Likely passing a stone. Patient feels better after treatment.  Does have hematuria. Will have follow-up with urology.    Benjiman Core, MD 01/26/15 1534

## 2015-01-24 NOTE — ED Notes (Signed)
Pain rt flank, onset 7 pm.  N/v

## 2015-01-25 MED ORDER — HYDROMORPHONE HCL 1 MG/ML IJ SOLN
1.0000 mg | Freq: Once | INTRAMUSCULAR | Status: AC
  Administered 2015-01-25: 1 mg via INTRAVENOUS
  Filled 2015-01-25: qty 1

## 2015-01-25 MED ORDER — OXYCODONE-ACETAMINOPHEN 5-325 MG PO TABS: 2.0000 | ORAL_TABLET | ORAL | Status: DC | PRN

## 2015-01-25 MED ORDER — OXYCODONE-ACETAMINOPHEN 5-325 MG PO TABS: 1.0000 | ORAL_TABLET | Freq: Four times a day (QID) | ORAL | Status: DC | PRN

## 2015-01-25 NOTE — ED Notes (Signed)
Pt was given a pre-package of Percocet 5-325 mg quantity six, and given verbal instructions on use, patient understand directions.

## 2015-01-25 NOTE — Discharge Instructions (Signed)

## 2015-01-26 LAB — URINE CULTURE
Colony Count: NO GROWTH
Culture: NO GROWTH

## 2015-01-29 ENCOUNTER — Emergency Department (HOSPITAL_COMMUNITY)
Admission: EM | Admit: 2015-01-29 | Discharge: 2015-01-29 | Disposition: A | Discharge status: Home / Self Care | Payer: Managed Care, Other (non HMO) | Attending: Emergency Medicine | Admitting: Emergency Medicine

## 2015-01-29 ENCOUNTER — Encounter (HOSPITAL_COMMUNITY): Payer: Self-pay | Admitting: Emergency Medicine

## 2015-01-29 ENCOUNTER — Emergency Department (HOSPITAL_COMMUNITY): Payer: Managed Care, Other (non HMO)

## 2015-01-29 DIAGNOSIS — F329 Major depressive disorder, single episode, unspecified: Secondary | ICD-10-CM | POA: Diagnosis not present

## 2015-01-29 DIAGNOSIS — I10 Essential (primary) hypertension: Secondary | ICD-10-CM | POA: Diagnosis not present

## 2015-01-29 DIAGNOSIS — Z79899 Other long term (current) drug therapy: Secondary | ICD-10-CM | POA: Insufficient documentation

## 2015-01-29 DIAGNOSIS — N200 Calculus of kidney: Secondary | ICD-10-CM | POA: Diagnosis not present

## 2015-01-29 DIAGNOSIS — Z8669 Personal history of other diseases of the nervous system and sense organs: Secondary | ICD-10-CM | POA: Diagnosis not present

## 2015-01-29 DIAGNOSIS — R109 Unspecified abdominal pain: Secondary | ICD-10-CM

## 2015-01-29 DIAGNOSIS — F419 Anxiety disorder, unspecified: Secondary | ICD-10-CM | POA: Insufficient documentation

## 2015-01-29 LAB — URINALYSIS, ROUTINE W REFLEX MICROSCOPIC
Bilirubin Urine: NEGATIVE
Glucose, UA: NEGATIVE mg/dL
Hgb urine dipstick: NEGATIVE
Ketones, ur: NEGATIVE mg/dL
Leukocytes, UA: NEGATIVE
Nitrite: NEGATIVE
Protein, ur: NEGATIVE mg/dL
Specific Gravity, Urine: >1.03 — ABNORMAL HIGH (ref 1.005–1.030)
UROBILINOGEN UA: 0.2 mg/dL (ref 0.0–1.0)
pH: 5.5 (ref 5.0–8.0)

## 2015-01-29 LAB — BASIC METABOLIC PANEL
ANION GAP: 8 (ref 5–15)
BUN: 13 mg/dL (ref 6–23)
CO2: 30 mmol/L (ref 19–32)
CREATININE: 1.05 mg/dL (ref 0.50–1.35)
Calcium: 9 mg/dL (ref 8.4–10.5)
Chloride: 104 mmol/L (ref 96–112)
GFR calc non Af Amer: 83 mL/min — ABNORMAL LOW (ref >90)
Glucose, Bld: 112 mg/dL — ABNORMAL HIGH (ref 70–99)
POTASSIUM: 3.6 mmol/L (ref 3.5–5.1)
Sodium: 142 mmol/L (ref 135–145)

## 2015-01-29 LAB — CBC
HEMATOCRIT: 49.1 % (ref 39.0–52.0)
Hemoglobin: 16.5 g/dL (ref 13.0–17.0)
MCH: 28.9 pg (ref 26.0–34.0)
MCHC: 33.6 g/dL (ref 30.0–36.0)
MCV: 86.1 fL (ref 78.0–100.0)
PLATELETS: 200 10*3/uL (ref 150–400)
RBC: 5.7 MIL/uL (ref 4.22–5.81)
RDW: 13.1 % (ref 11.5–15.5)
WBC: 6.2 10*3/uL (ref 4.0–10.5)

## 2015-01-29 LAB — HEPATIC FUNCTION PANEL
ALBUMIN: 4.2 g/dL (ref 3.5–5.2)
ALK PHOS: 52 U/L (ref 39–117)
ALT: 19 U/L (ref 0–53)
AST: 16 U/L (ref 0–37)
BILIRUBIN DIRECT: 0.1 mg/dL (ref 0.0–0.5)
BILIRUBIN TOTAL: 0.6 mg/dL (ref 0.3–1.2)
Indirect Bilirubin: 0.5 mg/dL (ref 0.3–0.9)
Total Protein: 6.8 g/dL (ref 6.0–8.3)

## 2015-01-29 LAB — LIPASE, BLOOD: LIPASE: 37 U/L (ref 11–59)

## 2015-01-29 MED ORDER — PROMETHAZINE HCL 25 MG PO TABS: 25.0000 mg | ORAL_TABLET | Freq: Four times a day (QID) | ORAL | Status: DC | PRN

## 2015-01-29 MED ORDER — HYDROMORPHONE HCL 1 MG/ML IJ SOLN
1.0000 mg | Freq: Once | INTRAMUSCULAR | Status: AC
  Administered 2015-01-29: 1 mg via INTRAVENOUS
  Filled 2015-01-29: qty 1

## 2015-01-29 MED ORDER — SODIUM CHLORIDE 0.9 % IV BOLUS (SEPSIS)
1000.0000 mL | Freq: Once | INTRAVENOUS | Status: AC
  Administered 2015-01-29: 1000 mL via INTRAVENOUS

## 2015-01-29 MED ORDER — ONDANSETRON HCL 4 MG/2ML IJ SOLN
4.0000 mg | Freq: Once | INTRAMUSCULAR | Status: AC
  Administered 2015-01-29: 4 mg via INTRAVENOUS
  Filled 2015-01-29: qty 2

## 2015-01-29 MED ORDER — KETOROLAC TROMETHAMINE 30 MG/ML IJ SOLN
30.0000 mg | Freq: Once | INTRAMUSCULAR | Status: AC
  Administered 2015-01-29: 30 mg via INTRAVENOUS
  Filled 2015-01-29: qty 1

## 2015-01-29 NOTE — ED Notes (Signed)
Pt reports seen for same on Monday. Pt reports continued right flank pain,dysuria,n/v/d.

## 2015-01-29 NOTE — ED Provider Notes (Signed)
CSN: [id number]     Arrival date & time 01/29/15  4098 History   First MD Initiated Contact with Patient 01/29/15 [phone]     Chief Complaint  Patient presents with  . Flank Pain     (Consider location/radiation/quality/duration/timing/severity/associated sxs/prior Treatment) HPI Comments: The patient is a 44 year old male, he has a history of multiple kidney stones in the past, in January he was admitted to the hospital for chest pain during which time he had a CT angiogram performed, the CT angiogram of his abdomen and pelvis showed no signs of vascular perfusion abnormalities but did show a punctate 2 mm right-sided kidney stone. He states that approximately 5 days ago he developed acute onset of right flank pain which has been intermittent, sharp and stabbing, has some radiation to the groin. He was initially evaluated and had a plain film x-ray showing no calcifications though he has had persistent hematuria. He reports following up at his doctor's office, hematuria. Present, he does not believe that he has passed a kidney stone. He does have some nausea and vomiting, some diaphoresis, he does have pain medication at home but is concerned that he has not yet passed a kidney stone. He denies any prior abdominal surgery, he has required lithotripsy in the past for kidney stones  Patient is a 44 y.o. male presenting with flank pain. The history is provided by the patient and medical records.  Flank Pain    Past Medical History  Diagnosis Date  . Hypertension   . Narcolepsy   . Depression   . Anxiety   . Insomnia   . Kidney stone    History reviewed. No pertinent past surgical history. History reviewed. No pertinent family history. History  Substance Use Topics  . Smoking status: Never Smoker   . Smokeless tobacco: Current User    Types: Snuff  . Alcohol Use: No     Comment: ocassionally    Review of Systems  Genitourinary: Positive for flank pain.  All other systems reviewed and  are negative.     Allergies  Demerol  Home Medications   Prior to Admission medications   Medication Sig Start Date End Date Taking? Authorizing Provider  clonazePAM (KLONOPIN) 1 MG tablet Take 1 mg by mouth at bedtime. 10/22/14   Historical Provider, MD  hydrochlorothiazide (HYDRODIURIL) 25 MG tablet Take 25 mg by mouth daily. 01/24/15   Historical Provider, MD  ibuprofen (ADVIL,MOTRIN) 200 MG tablet Take 400 mg by mouth every 6 (six) hours as needed for headache.    Historical Provider, MD  methocarbamol (ROBAXIN) 500 MG tablet Take 1 tablet (500 mg total) by mouth 2 (two) times daily as needed for muscle spasms. Patient not taking: Reported on 01/24/2015 11/17/13   Loren Racer, MD  methylphenidate (RITALIN) 20 MG tablet Take 20 mg by mouth 2 (two) times daily.     Historical Provider, MD  metoprolol tartrate (LOPRESSOR) 25 MG tablet Take 1.5 tablets (37.5 mg total) by mouth 2 (two) times daily. Patient not taking: Reported on 01/24/2015 10/25/14   Erick Blinks, MD  oxyCODONE-acetaminophen (PERCOCET/ROXICET) 5-325 MG per tablet Take 2 tablets by mouth every 4 (four) hours as needed for severe pain. 01/25/15   Benjiman Core, MD  oxyCODONE-acetaminophen (PERCOCET/ROXICET) 5-325 MG per tablet Take 1-2 tablets by mouth every 6 (six) hours as needed. 01/25/15   Benjiman Core, MD  PARoxetine (PAXIL) 20 MG tablet Take 20 mg by mouth daily. 01/24/15   Historical Provider, MD  polyethylene glycol (MIRALAX)  packet Take 17 g by mouth daily as needed for mild constipation. Patient not taking: Reported on 01/24/2015 10/25/14   Erick BlinksJehanzeb Memon, MD  promethazine (PHENERGAN) 25 MG tablet Take 1 tablet (25 mg total) by mouth every 6 (six) hours as needed for nausea or vomiting. 01/29/15   Eber HongBrian Maude Hettich, MD   BP 139/107 mmHg  Pulse 83  Temp(Src) 98.2 F (36.8 C) (Oral)  Resp 18  Ht 6\' 2"  (1.88 m)  Wt 210 lb (95.255 kg)  BMI 26.95 kg/m2  SpO2 100% Physical Exam  Constitutional: He appears  well-developed and well-nourished. No distress.  HENT:  Head: Normocephalic and atraumatic.  Mouth/Throat: Oropharynx is clear and moist. No oropharyngeal exudate.  Eyes: Conjunctivae and EOM are normal. Pupils are equal, round, and reactive to light. Right eye exhibits no discharge. Left eye exhibits no discharge. No scleral icterus.  Neck: Normal range of motion. Neck supple. No JVD present. No thyromegaly present.  Cardiovascular: Normal rate, regular rhythm, normal heart sounds and intact distal pulses.  Exam reveals no gallop and no friction rub.   No murmur heard. Pulmonary/Chest: Effort normal and breath sounds normal. No respiratory distress. He has no wheezes. He has no rales.  Abdominal: Soft. Bowel sounds are normal. He exhibits no distension and no mass. There is tenderness (minimal right upper quadrant tenderness, no Murphy sign, no pain in McBurney's point).  Mild right CVA tenderness,  nonperitoneal abdomen  Musculoskeletal: Normal range of motion. He exhibits no edema or tenderness.  Lymphadenopathy:    He has no cervical adenopathy.  Neurological: He is alert. Coordination normal.  Skin: Skin is warm and dry. No rash noted. No erythema.  Psychiatric: He has a normal mood and affect. His behavior is normal.  Nursing note and vitals reviewed.   ED Course  Procedures (including critical care time) Labs Review Labs Reviewed  URINALYSIS, ROUTINE W REFLEX MICROSCOPIC - Abnormal; Notable for the following:    APPearance HAZY (*)    Specific Gravity, Urine >1.030 (*)    All other components within normal limits  BASIC METABOLIC PANEL - Abnormal; Notable for the following:    Glucose, Bld 112 (*)    GFR calc non Af Amer 83 (*)    All other components within normal limits  CBC  HEPATIC FUNCTION PANEL  LIPASE, BLOOD    Imaging Review Ct Renal Stone Study  01/29/2015   CLINICAL DATA:  Right flank pain x 6 days. Nausea,vomiting,diarrhea,dysuria. Hx of kidney stone./  EXAM:  CT ABDOMEN AND PELVIS WITHOUT CONTRAST  TECHNIQUE: Multidetector CT imaging of the abdomen and pelvis was performed following the standard protocol without IV contrast.  COMPARISON:  10/25/2014  FINDINGS: 2 mm stone projects into the bladder lumen from the right ureterovesicular junction. The right ureter is normal course and in caliber. There is no right hydronephrosis. No right-sided perinephric stranding.  No definite intrarenal stones. No renal masses. No bladder mass or wall thickening.  Minor dependent subsegmental atelectasis at the lung bases. Heart is normal in size.  Liver, spleen, gallbladder, pancreas, adrenal glands:  Normal.  No adenopathy.  No ascites.  Few scattered sigmoid colon diverticula. No diverticulitis. Colon otherwise unremarkable. Normal small bowel. Normal appendix.  No osteoblastic or osteolytic lesions.  IMPRESSION: 1. 2 mm stone projects into the bladder lumen from the region of the right ureterovesicular junction. There is no dilation of the right intrarenal collecting system or of the right ureter. Stone may reside within the ureterovesicular junction or be within  the bladder reflecting a recently passed ureteral stone. 2. No other evidence of an acute abnormality. No intrarenal stones or hydronephrosis on either side. 3. Normal appendix visualized.   Electronically Signed   By: Amie Portland M.D.   On: 01/29/2015 08:37      MDM   Final diagnoses:  Flank pain  Kidney stone on right side    Vital signs unremarkable except for mild hypertension, imaging reviewed, history of right-sided kidney stone however with prolonged symptoms lasting approximately 5 days he will need evaluation for renal function if there is an obstructing stone, CT scan without contrast to evaluate for size, pain medications ordered as below. We'll also evaluate with liver function tests to rule out hepatic or biliary source.  Labs without leukocytosis, hematuria or renal / hepatic dysfunction - CT  shows passing stone into bladder - stable for d/c.  Does not appear to have colic at this time.  Pt informed - stable for d/c.  Meds given in ED:  Medications  HYDROmorphone (DILAUDID) injection 1 mg (1 mg Intravenous Given 01/29/15 0736)  ondansetron (ZOFRAN) injection 4 mg (4 mg Intravenous Given 01/29/15 0737)  sodium chloride 0.9 % bolus 1,000 mL (0 mLs Intravenous Stopped 01/29/15 0845)  ketorolac (TORADOL) 30 MG/ML injection 30 mg (30 mg Intravenous Given 01/29/15 0843)    New Prescriptions   PROMETHAZINE (PHENERGAN) 25 MG TABLET    Take 1 tablet (25 mg total) by mouth every 6 (six) hours as needed for nausea or vomiting.        Eber Hong, MD 01/29/15 [phone]

## 2015-01-29 NOTE — Discharge Instructions (Signed)
Your CT scan shows a very small stone which is almost ready to pass - keep taking your pain medicines as prescribed-  I have added additional nausea medicine as needed.    Please call your doctor for a followup appointment within 24-48 hours. When you talk to your doctor please let them know that you were seen in the emergency department and have them acquire all of your records so that they can discuss the findings with you and formulate a treatment plan to fully care for your new and ongoing problems.

## 2015-01-31 MED FILL — Oxycodone w/ Acetaminophen Tab 5-325 MG: ORAL | Qty: 6 | Status: AC

## 2016-06-05 ENCOUNTER — Emergency Department (HOSPITAL_COMMUNITY): Payer: 59

## 2016-06-05 ENCOUNTER — Emergency Department (HOSPITAL_COMMUNITY)
Admission: EM | Admit: 2016-06-05 | Discharge: 2016-06-05 | Disposition: A | Discharge status: Home / Self Care | Payer: 59 | Attending: Emergency Medicine | Admitting: Emergency Medicine

## 2016-06-05 ENCOUNTER — Encounter (HOSPITAL_COMMUNITY): Payer: Self-pay | Admitting: Emergency Medicine

## 2016-06-05 DIAGNOSIS — R197 Diarrhea, unspecified: Secondary | ICD-10-CM | POA: Insufficient documentation

## 2016-06-05 DIAGNOSIS — Z791 Long term (current) use of non-steroidal anti-inflammatories (NSAID): Secondary | ICD-10-CM | POA: Diagnosis not present

## 2016-06-05 DIAGNOSIS — I10 Essential (primary) hypertension: Secondary | ICD-10-CM | POA: Diagnosis not present

## 2016-06-05 DIAGNOSIS — R1013 Epigastric pain: Secondary | ICD-10-CM | POA: Insufficient documentation

## 2016-06-05 DIAGNOSIS — Z79899 Other long term (current) drug therapy: Secondary | ICD-10-CM | POA: Insufficient documentation

## 2016-06-05 DIAGNOSIS — R112 Nausea with vomiting, unspecified: Secondary | ICD-10-CM | POA: Diagnosis present

## 2016-06-05 DIAGNOSIS — F17299 Nicotine dependence, other tobacco product, with unspecified nicotine-induced disorders: Secondary | ICD-10-CM | POA: Diagnosis not present

## 2016-06-05 LAB — COMPREHENSIVE METABOLIC PANEL
ALT: 30 U/L (ref 17–63)
AST: 20 U/L (ref 15–41)
Albumin: 4.3 g/dL (ref 3.5–5.0)
Alkaline Phosphatase: 48 U/L (ref 38–126)
Anion gap: 10 (ref 5–15)
BUN: 20 mg/dL (ref 6–20)
CHLORIDE: 107 mmol/L (ref 101–111)
CO2: 23 mmol/L (ref 22–32)
Calcium: 9.3 mg/dL (ref 8.9–10.3)
Creatinine, Ser: 0.96 mg/dL (ref 0.61–1.24)
Glucose, Bld: 144 mg/dL — ABNORMAL HIGH (ref 65–99)
POTASSIUM: 4.1 mmol/L (ref 3.5–5.1)
SODIUM: 140 mmol/L (ref 135–145)
Total Bilirubin: 1.1 mg/dL (ref 0.3–1.2)
Total Protein: 7.2 g/dL (ref 6.5–8.1)

## 2016-06-05 LAB — CBC WITH DIFFERENTIAL/PLATELET
Basophils Absolute: 0 10*3/uL (ref 0.0–0.1)
Basophils Relative: 0 %
EOS ABS: 0.2 10*3/uL (ref 0.0–0.7)
Eosinophils Relative: 2 %
HCT: 51.6 % (ref 39.0–52.0)
Hemoglobin: 17.3 g/dL — ABNORMAL HIGH (ref 13.0–17.0)
LYMPHS PCT: 6 %
Lymphs Abs: 0.6 10*3/uL — ABNORMAL LOW (ref 0.7–4.0)
MCH: 29 pg (ref 26.0–34.0)
MCHC: 33.5 g/dL (ref 30.0–36.0)
MCV: 86.6 fL (ref 78.0–100.0)
MONO ABS: 0.5 10*3/uL (ref 0.1–1.0)
Monocytes Relative: 5 %
Neutro Abs: 9 10*3/uL — ABNORMAL HIGH (ref 1.7–7.7)
Neutrophils Relative %: 87 %
PLATELETS: 201 10*3/uL (ref 150–400)
RBC: 5.96 MIL/uL — ABNORMAL HIGH (ref 4.22–5.81)
RDW: 13.4 % (ref 11.5–15.5)
WBC: 10.2 10*3/uL (ref 4.0–10.5)

## 2016-06-05 LAB — LIPASE, BLOOD: LIPASE: 18 U/L (ref 11–51)

## 2016-06-05 MED ORDER — KETOROLAC TROMETHAMINE 30 MG/ML IJ SOLN
15.0000 mg | Freq: Once | INTRAMUSCULAR | Status: AC
  Administered 2016-06-05: 15 mg via INTRAVENOUS
  Filled 2016-06-05: qty 1

## 2016-06-05 MED ORDER — SODIUM CHLORIDE 0.9 % IV BOLUS (SEPSIS)
1000.0000 mL | Freq: Once | INTRAVENOUS | Status: AC
  Administered 2016-06-05: 1000 mL via INTRAVENOUS

## 2016-06-05 MED ORDER — IOPAMIDOL (ISOVUE-300) INJECTION 61%
INTRAVENOUS | Status: AC
  Administered 2016-06-05: 30 mL
  Filled 2016-06-05: qty 30

## 2016-06-05 MED ORDER — KETOROLAC TROMETHAMINE 15 MG/ML IJ SOLN
15.0000 mg | Freq: Once | INTRAMUSCULAR | Status: DC
  Filled 2016-06-05: qty 1

## 2016-06-05 MED ORDER — ONDANSETRON HCL 4 MG/2ML IJ SOLN
4.0000 mg | Freq: Once | INTRAMUSCULAR | Status: AC
  Administered 2016-06-05: 4 mg via INTRAVENOUS
  Filled 2016-06-05: qty 2

## 2016-06-05 MED ORDER — IOPAMIDOL (ISOVUE-300) INJECTION 61%
100.0000 mL | Freq: Once | INTRAVENOUS | Status: AC | PRN
  Administered 2016-06-05: 100 mL via INTRAVENOUS

## 2016-06-05 MED ORDER — ONDANSETRON 4 MG PO TBDP: ORAL_TABLET | ORAL | 0 refills | Status: DC

## 2016-06-05 MED ORDER — FENTANYL CITRATE (PF) 100 MCG/2ML IJ SOLN
50.0000 ug | INTRAMUSCULAR | Status: DC | PRN
  Administered 2016-06-05: 50 ug via INTRAVENOUS
  Filled 2016-06-05: qty 2

## 2016-06-05 NOTE — ED Provider Notes (Signed)
[facility]EMERGENCY DEPT Provider Note   CSN: [id number] Arrival date & time: 06/05/16  4098  By signing my name below, I, Soijett Blue, attest that this documentation has been prepared under the direction and in the presence of Blane Ohara, MD. Electronically Signed: Soijett Blue, ED Scribe. 06/05/16. 11:31 AM.    History   Chief Complaint Chief Complaint  Patient presents with  . Emesis    HPI  Gary Wyatt is a 45 y.o. male with a medical hx of HTN who presents to the Emergency Department complaining of vomiting onset 2 AM this morning. Pt denies sick contacts. He states that he is having associated symptoms of nausea, diarrhea, hematemesis x 1 episode, and epigastric abdominal pain. He states that he has not tried any medications for the relief of his symptoms. He denies fever, HA, leg swelling, and any other symptoms. Denies abdominal or pelvic surgery, foreign travel, new medications, or new foods. Denies allergies to any medications.     The history is provided by the patient. No language interpreter was used.  Emesis   This is a new problem. The current episode started 6 to 12 hours ago. The problem has not changed since onset.There has been no fever. Associated symptoms include abdominal pain and diarrhea. Pertinent negatives include no fever and no headaches.    Past Medical History:  Diagnosis Date  . Anxiety   . Depression   . Hypertension   . Insomnia   . Kidney stone   . Narcolepsy     Patient Active Problem List   Diagnosis Date Noted  . Headache   . Atypical chest pain 10/24/2014  . Hypertension 10/24/2014    History reviewed. No pertinent surgical history.     Home Medications    Prior to Admission medications   Medication Sig Start Date End Date Taking? Authorizing Provider  CIALIS 10 MG tablet Take 1 tablet by mouth daily as needed for erectile dysfunction. 06/02/16  Yes Historical Provider, MD  clonazePAM (KLONOPIN) 1 MG tablet Take 1 mg by  mouth at bedtime. 10/22/14  Yes Historical Provider, MD  cyclobenzaprine (FLEXERIL) 10 MG tablet Take 1 tablet by mouth daily as needed for muscle spasms. 05/11/16  Yes Historical Provider, MD  ibuprofen (ADVIL,MOTRIN) 200 MG tablet Take 400 mg by mouth every 6 (six) hours as needed for headache.   Yes Historical Provider, MD  Ibuprofen-Diphenhydramine HCl (ADVIL PM) 200-25 MG CAPS Take 1 capsule by mouth daily as needed (sleep).   Yes Historical Provider, MD  lisinopril-hydrochlorothiazide (PRINZIDE,ZESTORETIC) 10-12.5 MG tablet Take 1 tablet by mouth daily. 05/18/16  Yes Historical Provider, MD  methylphenidate (RITALIN) 20 MG tablet Take 20 mg by mouth 2 (two) times daily.    Yes Historical Provider, MD  omeprazole (PRILOSEC) 20 MG capsule Take 1 capsule by mouth 2 (two) times daily. 04/20/16  Yes Historical Provider, MD  omeprazole-sodium bicarbonate (ZEGERID) 40-1100 MG capsule Take 1 capsule by mouth daily. 05/21/16  Yes Historical Provider, MD    Family History No family history on file.  Social History Social History  Substance Use Topics  . Smoking status: Never Smoker  . Smokeless tobacco: Current User    Types: Snuff  . Alcohol use No     Comment: ocassionally     Allergies   Demerol [meperidine]   Review of Systems Review of Systems  Constitutional: Negative for fever.  Cardiovascular: Negative for leg swelling.  Gastrointestinal: Positive for abdominal pain, diarrhea, nausea and vomiting.       +  hematemesis  Neurological: Negative for headaches.  All other systems reviewed and are negative.    Physical Exam Updated Vital Signs BP 129/97 (BP Location: Left Arm)   Pulse 90   Temp 97.9 F (36.6 C) (Oral)   Resp 17   Ht 6\' 2"  (1.88 m)   Wt 213 lb (96.6 kg)   SpO2 97%   BMI 27.35 kg/m   Physical Exam  Constitutional: He is oriented to person, place, and time. He appears well-developed and well-nourished. No distress.  HENT:  Head: Normocephalic and atraumatic.   Eyes: EOM are normal.  Neck: Neck supple.  Cardiovascular: Normal rate, regular rhythm and normal heart sounds.  Exam reveals no gallop and no friction rub.   No murmur heard. Pulmonary/Chest: Effort normal and breath sounds normal. No respiratory distress. He has no wheezes. He has no rales.  Abdominal: Soft. He exhibits no distension. There is tenderness in the epigastric area. There is no guarding.  Tenderness to epigastrium. No guarding.  Musculoskeletal: Normal range of motion.  Neurological: He is alert and oriented to person, place, and time.  Skin: Skin is warm and dry.  Psychiatric: He has a normal mood and affect. His behavior is normal.  Nursing note and vitals reviewed.    ED Treatments / Results  DIAGNOSTIC STUDIES: Oxygen Saturation is 97% on RA, nl by my interpretation.    COORDINATION OF CARE: 9:43 AM Discussed treatment plan with pt at bedside which includes labs, pain medications, zofran, and pt agreed to plan.   Labs (all labs ordered are listed, but only abnormal results are displayed) Labs Reviewed  CBC WITH DIFFERENTIAL/PLATELET - Abnormal; Notable for the following:       Result Value   RBC 5.96 (*)    Hemoglobin 17.3 (*)    Neutro Abs 9.0 (*)    Lymphs Abs 0.6 (*)    All other components within normal limits  COMPREHENSIVE METABOLIC PANEL - Abnormal; Notable for the following:    Glucose, Bld 144 (*)    All other components within normal limits  LIPASE, BLOOD    EKG  EKG Interpretation None       Radiology No results found.  Procedures Procedures (including critical care time)  Medications Ordered in ED Medications  fentaNYL (SUBLIMAZE) injection 50 mcg (50 mcg Intravenous Given 06/05/16 1026)  sodium chloride 0.9 % bolus 1,000 mL (not administered)  ketorolac (TORADOL) 30 MG/ML injection 15 mg (not administered)  sodium chloride 0.9 % bolus 1,000 mL (0 mLs Intravenous Stopped 06/05/16 1027)  ondansetron (ZOFRAN) injection 4 mg (4 mg  Intravenous Given 06/05/16 1026)     Initial Impression / Assessment and Plan / ED Course  I have reviewed the triage vital signs and the nursing notes.  Pertinent labs & imaging results that were available during my care of the patient were reviewed by me and considered in my medical decision making (see chart for details).  Clinical Course   Patient presents with clinical concern for gastroenteritis with recurrent vomiting and diarrhea. No red flags. No focal abdominal pain. Plan for blood work, supportive care, IV fluids, antiemetics and pain meds. Oral fluid challenge.  Pt with persistent pain on recheck.  CT scan done, unremarkable.  Likely GE.   Results and differential diagnosis were discussed with the patient/parent/guardian. Xrays were independently reviewed by myself.  Close follow up outpatient was discussed, comfortable with the plan.   Medications  fentaNYL (SUBLIMAZE) injection 50 mcg (50 mcg Intravenous Given 06/05/16 1026)  sodium chloride 0.9 % bolus 1,000 mL (0 mLs Intravenous Stopped 06/05/16 1027)  ondansetron (ZOFRAN) injection 4 mg (4 mg Intravenous Given 06/05/16 1026)  sodium chloride 0.9 % bolus 1,000 mL (0 mLs Intravenous Stopped 06/05/16 1249)  ketorolac (TORADOL) 30 MG/ML injection 15 mg (15 mg Intravenous Given 06/05/16 1131)  iopamidol (ISOVUE-300) 61 % injection (30 mLs  Contrast Given 06/05/16 1245)  iopamidol (ISOVUE-300) 61 % injection 100 mL (100 mLs Intravenous Contrast Given 06/05/16 1356)    Vitals:   06/05/16 0936 06/05/16 1133 06/05/16 1230 06/05/16 1330  BP:  113/82 114/83 107/84  Pulse:  91 90 79  Resp:      Temp:      TempSrc:      SpO2:  98% 99% 99%  Weight: 213 lb (96.6 kg)     Height: 6\' 2"  (1.88 m)       Final diagnoses:  Nausea vomiting and diarrhea     Final Clinical Impressions(s) / ED Diagnoses   Final diagnoses:  Nausea vomiting and diarrhea    New Prescriptions New Prescriptions   No medications on file     Blane Ohara, MD 06/05/16 1523

## 2016-06-05 NOTE — ED Notes (Signed)
Patient given sprite to drink. Tolerating well.

## 2016-06-05 NOTE — Discharge Instructions (Signed)
If you were given medicines take as directed.  If you are on coumadin or contraceptives realize their levels and effectiveness is altered by many different medicines.  If you have any reaction (rash, tongues swelling, other) to the medicines stop taking and see a physician.    If your blood pressure was elevated in the ER make sure you follow up for management with a primary doctor or return for chest pain, shortness of breath or stroke symptoms.  Please follow up as directed and return to the ER or see a physician for new or worsening symptoms.  Thank you. Vitals:   06/05/16 0936 06/05/16 1133 06/05/16 1230 06/05/16 1330  BP:  113/82 114/83 107/84  Pulse:  91 90 79  Resp:      Temp:      TempSrc:      SpO2:  98% 99% 99%  Weight: 213 lb (96.6 kg)     Height: 6\' 2"  (1.88 m)

## 2016-06-05 NOTE — ED Triage Notes (Signed)
Patient with n/v/d since 0200 today. States hematemesis. Reports periumbilical pain that started at 0200 as well.

## 2016-06-05 NOTE — ED Notes (Signed)
To Ct 

## 2019-08-31 ENCOUNTER — Encounter: Payer: Self-pay | Admitting: Neurology

## 2019-10-08 ENCOUNTER — Encounter: Payer: Self-pay | Admitting: Neurology

## 2019-10-08 NOTE — Progress Notes (Deleted)
Subjective:   Gary Wyatt was seen in consultation in the movement disorder clinic at the request of Jerene Bears B, MD.  The evaluation is for tremor.  The records that were made available to me were reviewed.  Patient is a 48 year old male with a history of diabetes mellitus, hypertension, depression, anxiety who presents today for the evaluation of tremor.  Tremor started approximately 1-2 months ago and involves the ***.  Tremor is most noticeable when ***.   There is *** family hx of tremor.    Affected by caffeine:  {yes no:314532} Affected by alcohol:  {yes no:314532} Affected by stress:  {yes no:314532} Affected by fatigue:  {yes no:314532} Spills soup if on spoon:  {yes no:314532} Spills glass of liquid if full:  {yes no:314532} Affects ADL's (tying shoes, brushing teeth, etc):  {yes no:314532}  Current/Previously tried tremor medications: ***  Current medications that may exacerbate tremor:  ***  Outside reports reviewed: {Outside review:15817}.  Allergies  Allergen Reactions  . Demerol [Meperidine] Itching and Other (See Comments)    irritation    Current Outpatient Medications  Medication Instructions  . CIALIS 10 MG tablet 1 tablet, Oral, Daily PRN  . clonazePAM (KLONOPIN) 1 mg, Oral, Daily at bedtime  . cyclobenzaprine (FLEXERIL) 10 MG tablet 1 tablet, Oral, Daily PRN  . ibuprofen (ADVIL) 400 mg, Oral, Every 6 hours PRN  . Ibuprofen-Diphenhydramine HCl (ADVIL PM) 200-25 MG CAPS 1 capsule, Oral, Daily PRN  . lisinopril-hydrochlorothiazide (PRINZIDE,ZESTORETIC) 10-12.5 MG tablet 1 tablet, Oral, Daily  . methylphenidate (RITALIN) 20 mg, Oral, 2 times daily  . omeprazole (PRILOSEC) 20 MG capsule 1 capsule, Oral, 2 times daily  . omeprazole-sodium bicarbonate (ZEGERID) 40-1100 MG capsule 1 capsule, Oral, Daily  . ondansetron (ZOFRAN ODT) 4 MG disintegrating tablet 4mg  ODT q4 hours prn nausea/vomit     Objective:   VITALS:  There were no vitals filed for this  visit. Gen:  Appears stated age and in NAD. HEENT:  Normocephalic, atraumatic. The mucous membranes are moist. The superficial temporal arteries are without ropiness or tenderness. Cardiovascular: Regular rate and rhythm. Lungs: Clear to auscultation bilaterally. Neck: There are no carotid bruits noted bilaterally.  NEUROLOGICAL:  Orientation:  The patient is alert and oriented x 3.   Cranial nerves: There is good facial symmetry. TExtraocular muscles are intact and visual fields are full to confrontational testing. Speech is fluent and clear. Soft palate rises symmetrically and there is no tongue deviation. Hearing is intact to conversational tone. Tone: Tone is good throughout. Sensation: Sensation is intact to light touch touch throughout (facial, trunk, extremities). Vibration is intact at the bilateral big toe. There is no extinction with double simultaneous stimulation. There is no sensory dermatomal level identified. Coordination:  The patient has no dysdiadichokinesia or dysmetria. Motor: Strength is 5/5 in the bilateral upper and lower extremities.  Shoulder shrug is equal bilaterally.  There is no pronator drift.  There are no fasciculations noted. DTR's: Deep tendon reflexes are 2/4 at the bilateral biceps, triceps, brachioradialis, patella and achilles.  Plantar responses are downgoing bilaterally. Gait and Station: The patient is able to ambulate without difficulty. The patient is able to heel toe walk without any difficulty. The patient is able to ambulate in a tandem fashion. The patient is able to stand in the Romberg position.   MOVEMENT EXAM: Tremor:  There is *** tremor in the UE, noted most significantly with action.  The patient is *** able to draw Archimedes spirals without significant  difficulty.  There is *** tremor at rest.  The patient is *** able to pour water from one glass to another without spilling it.     Assessment/Plan:   1.  Essential Tremor.  -This is  evidenced by the symmetrical nature and longstanding hx of gradually getting worse.  We discussed nature and pathophysiology.  We discussed that this can continue to gradually get worse with time.  We discussed that some medications can worsen this, as can caffeine use.  We discussed medication therapy as well as surgical therapy.  Ultimately, the patient decided to ***.    Total time spent on today's visit was ***greater than 60 minutes, including both face-to-face time and nonface-to-face time.  Time included that spent on review of records (prior notes available to me/labs/imaging if pertinent), discussing treatment and goals, answering patient's questions and coordinating care.  CC:  [name], MD

## 2019-10-13 ENCOUNTER — Ambulatory Visit: Payer: 59 | Admitting: Neurology

## 2020-07-02 ENCOUNTER — Emergency Department (HOSPITAL_COMMUNITY)
Admission: EM | Admit: 2020-07-02 | Discharge: 2020-07-02 | Disposition: A | Discharge status: Home / Self Care | Payer: 59 | Attending: Emergency Medicine | Admitting: Emergency Medicine

## 2020-07-02 ENCOUNTER — Encounter (HOSPITAL_COMMUNITY): Payer: Self-pay | Admitting: Emergency Medicine

## 2020-07-02 ENCOUNTER — Emergency Department (HOSPITAL_COMMUNITY): Payer: 59

## 2020-07-02 ENCOUNTER — Other Ambulatory Visit: Payer: Self-pay

## 2020-07-02 DIAGNOSIS — Z79899 Other long term (current) drug therapy: Secondary | ICD-10-CM | POA: Insufficient documentation

## 2020-07-02 DIAGNOSIS — U071 COVID-19: Secondary | ICD-10-CM | POA: Insufficient documentation

## 2020-07-02 DIAGNOSIS — E119 Type 2 diabetes mellitus without complications: Secondary | ICD-10-CM | POA: Insufficient documentation

## 2020-07-02 DIAGNOSIS — I10 Essential (primary) hypertension: Secondary | ICD-10-CM | POA: Diagnosis not present

## 2020-07-02 DIAGNOSIS — R05 Cough: Secondary | ICD-10-CM | POA: Diagnosis present

## 2020-07-02 MED ORDER — ALBUTEROL SULFATE HFA 108 (90 BASE) MCG/ACT IN AERS
2.0000 | INHALATION_SPRAY | Freq: Once | RESPIRATORY_TRACT | Status: DC | PRN
  Filled 2020-07-02: qty 6.7

## 2020-07-02 MED ORDER — FAMOTIDINE IN NACL 20-0.9 MG/50ML-% IV SOLN: 20.0000 mg | Freq: Once | INTRAVENOUS | Status: DC | PRN

## 2020-07-02 MED ORDER — EPINEPHRINE 0.3 MG/0.3ML IJ SOAJ: 0.3000 mg | Freq: Once | INTRAMUSCULAR | Status: DC | PRN

## 2020-07-02 MED ORDER — DIPHENHYDRAMINE HCL 50 MG/ML IJ SOLN: 50.0000 mg | Freq: Once | INTRAMUSCULAR | Status: DC | PRN

## 2020-07-02 MED ORDER — METHYLPREDNISOLONE SODIUM SUCC 125 MG IJ SOLR: 125.0000 mg | Freq: Once | INTRAMUSCULAR | Status: DC | PRN

## 2020-07-02 MED ORDER — SODIUM CHLORIDE 0.9 % IV SOLN: Freq: Once | INTRAVENOUS | Status: DC

## 2020-07-02 MED ORDER — SODIUM CHLORIDE 0.9 % IV SOLN
1200.0000 mg | Freq: Once | INTRAVENOUS | Status: AC
  Administered 2020-07-02: 1200 mg via INTRAVENOUS

## 2020-07-02 MED ORDER — ALBUTEROL SULFATE HFA 108 (90 BASE) MCG/ACT IN AERS
4.0000 | INHALATION_SPRAY | Freq: Once | RESPIRATORY_TRACT | Status: AC
  Administered 2020-07-02: 4 via RESPIRATORY_TRACT
  Filled 2020-07-02: qty 6.7

## 2020-07-02 MED ORDER — SODIUM CHLORIDE 0.9 % IV SOLN: 1200.0000 mg | Freq: Once | INTRAVENOUS | Status: DC

## 2020-07-02 MED ORDER — SODIUM CHLORIDE 0.9 % IV SOLN: INTRAVENOUS | Status: DC | PRN

## 2020-07-02 NOTE — ED Provider Notes (Signed)
[facility] EMERGENCY DEPARTMENT Provider Note   CSN: [id number] Arrival date & time: 07/02/20  0414   History Chief Complaint  Patient presents with  . Covid Positive    Gary Wyatt is a 49 y.o. male.  The history is provided by the patient.  He has history of hypertension, diabetes and comes in because of positive COVID-19 test.  He started having sore throat cough 3 days ago.  He went to be tested for COVID-19 even though he did not have any known COVID-19 exposures.  At that visit, he was prescribed azithromycin and prednisone.  He has not shown any improvement with his course of azithromycin and prednisone. he has not had the COVID-19 vaccine.  He does feel slightly dyspneic and notices a burning in the mid chest.  He has not lost his sense of smell or taste.  There has been no vomiting or diarrhea.  He denies arthralgias or myalgias.    Past Medical History:  Diagnosis Date  . Anxiety   . Depression   . Diabetes mellitus (HCC)    type 2  . Hypertension   . Insomnia   . Kidney stone   . Narcolepsy   . TIA (transient ischemic attack)     Patient Active Problem List   Diagnosis Date Noted  . Headache   . Atypical chest pain 10/24/2014  . Hypertension 10/24/2014    History reviewed. No pertinent surgical history.     Family History  Problem Relation Age of Onset  . Hypertension Mother   . Diabetes Father   . CAD Father     Social History   Tobacco Use  . Smoking status: Never Smoker  . Smokeless tobacco: Current User    Types: Snuff  Substance Use Topics  . Alcohol use: No    Comment: ocassionally  . Drug use: No    Home Medications Prior to Admission medications   Medication Sig Start Date End Date Taking? Authorizing Provider  CIALIS 10 MG tablet Take 1 tablet by mouth daily as needed for erectile dysfunction. 06/02/16   [provider]  clonazePAM (KLONOPIN) 1 MG tablet Take 1 mg by mouth at bedtime. 10/22/14   [provider]    cyclobenzaprine (FLEXERIL) 10 MG tablet Take 1 tablet by mouth daily as needed for muscle spasms. 05/11/16   [provider]  ibuprofen (ADVIL,MOTRIN) 200 MG tablet Take 400 mg by mouth every 6 (six) hours as needed for headache.    [provider]  Ibuprofen-Diphenhydramine HCl (ADVIL PM) 200-25 MG CAPS Take 1 capsule by mouth daily as needed (sleep).    [provider]  lisinopril-hydrochlorothiazide (PRINZIDE,ZESTORETIC) 10-12.5 MG tablet Take 1 tablet by mouth daily. 05/18/16   [provider]  methylphenidate (RITALIN) 20 MG tablet Take 20 mg by mouth 2 (two) times daily.     [provider]  omeprazole (PRILOSEC) 20 MG capsule Take 1 capsule by mouth 2 (two) times daily. 04/20/16   [provider]  omeprazole-sodium bicarbonate (ZEGERID) 40-1100 MG capsule Take 1 capsule by mouth daily. 05/21/16   [provider]  ondansetron (ZOFRAN ODT) 4 MG disintegrating tablet 4mg  ODT q4 hours prn nausea/vomit 06/05/16   06/07/16, MD    Allergies    Demerol [meperidine]  Review of Systems   Review of Systems  All other systems reviewed and are negative.   Physical Exam Updated Vital Signs BP (!) 178/114 (BP Location: Right Arm)   Pulse 68  Temp 98.1 F (36.7 C) (Oral)   Resp 17   Wt 97.5 kg   SpO2 98%   BMI 27.60 kg/m   Physical Exam Vitals and nursing note reviewed.   50 year old male, resting comfortably and in no acute distress. Vital signs are significant for elevated blood pressure. Oxygen saturation is 98%, which is normal. Head is normocephalic and atraumatic. PERRLA, EOMI. Oropharynx is clear. Neck is nontender and supple without adenopathy or JVD. Back is nontender and there is no CVA tenderness. Lungs are clear without rales, wheezes, or rhonchi. Chest is nontender. Heart has regular rate and rhythm without murmur. Abdomen is soft, flat, nontender without masses or hepatosplenomegaly and peristalsis is  normoactive. Extremities have no cyanosis or edema, full range of motion is present. Skin is warm and dry without rash. Neurologic: Mental status is normal, cranial nerves are intact, there are no motor or sensory deficits.   ED Results / Procedures / Treatments    Radiology DG Chest Portable 1 View  Result Date: 07/02/2020 CLINICAL DATA:  Initial evaluation for acute shortness of breath. EXAM: PORTABLE CHEST 1 VIEW COMPARISON:  Prior radiograph from 05/20/2020. FINDINGS: Cardiomegaly, stable.  Mediastinal silhouette within normal limits. Lungs mildly hypoinflated. No focal infiltrates. No edema or effusion. No pneumothorax. No acute osseous finding. IMPRESSION: 1. No active cardiopulmonary disease. 2. Stable cardiomegaly without pulmonary edema. Electronically Signed   By: Rise Mu M.D.   On: 07/02/2020 05:40    Procedures Procedures   Medications Ordered in ED Medications  albuterol (VENTOLIN HFA) 108 (90 Base) MCG/ACT inhaler 4 puff (has no administration in time range)  casirivimab-imdevimab (REGEN-COV) 1,200 mg in sodium chloride 0.9 % 110 mL IVPB (has no administration in time range)  0.9 %  sodium chloride infusion (has no administration in time range)  diphenhydrAMINE (BENADRYL) injection 50 mg (has no administration in time range)  famotidine (PEPCID) IVPB 20 mg premix (has no administration in time range)  methylPREDNISolone sodium succinate (SOLU-MEDROL) 125 mg/2 mL injection 125 mg (has no administration in time range)  albuterol (VENTOLIN HFA) 108 (90 Base) MCG/ACT inhaler 2 puff (has no administration in time range)  EPINEPHrine (EPI-PEN) injection 0.3 mg (has no administration in time range)    ED Course  I have reviewed the triage vital signs and the nursing notes.  Pertinent labs & imaging results that were available during my care of the patient were reviewed by me and considered in my medical decision making (see chart for details).    MDM  Rules/Calculators/A&P COVID-19 infection.  Chest x-ray shows no evidence of pneumonia, oxygen saturation is good.  Blood pressure is noted to be significantly elevated and will need to be watched in the ED.  Chest x-ray shows no evidence of pneumonia.  He meets criteria for monoclonal antibody infusion because of underlying diabetes and hypertension.  Monoclonal antibody is ordered.  Old records are reviewed showing urgent care visit on 9/22 with prescription given for azithromycin and prednisone taper.  COVID-19 test at that time was positive.   Waynetta Pean was evaluated in Emergency Department on 07/02/2020 for the symptoms described in the history of present illness. He was evaluated in the context of the global COVID-19 pandemic, which necessitated consideration that the patient might be at risk for infection with the SARS-CoV-2 virus that causes COVID-19. Institutional protocols and algorithms that pertain to the evaluation of patients at risk for COVID-19 are in a state of rapid change based on information released by  regulatory bodies including the CDC and federal and state organizations. These policies and algorithms were followed during the patient's care in the ED.  Final Clinical Impression(s) / ED Diagnoses Final diagnoses:  COVID-19 virus infection  Elevated blood pressure reading with diagnosis of hypertension    Rx / DC Orders ED Discharge Orders    None       Dione Booze, MD 07/02/20 [phone]

## 2020-07-02 NOTE — ED Provider Notes (Signed)
mAB infusion without issues / side effects Stable for d/c No hypoxia   Eber Hong, MD 07/02/20 [phone]

## 2020-07-02 NOTE — ED Triage Notes (Signed)
Pt reports cough and SOB. Pt received prescription for azithromycin and prednisone. Pt is COVID positive. Pt had positive result on 07/01/2020.

## 2020-07-02 NOTE — ED Notes (Signed)
Pt did not want to wait remainder of time for monitoring period. Explained risks to leaving and he verbalized understanding. Pt is alert and oriented x 4. NAD noted. Vital signs stable. No sx of reaction to MAB infusion.

## 2020-07-02 NOTE — ED Notes (Signed)
Waiting for pharmacy to verify to proceed with MAB infusion. Contacted pharmacy

## 2020-07-02 NOTE — Discharge Instructions (Addendum)
Return to ED for worsening symptoms, low oxygen or any worsening condition  Quarantine for 10 days from the first day of your illness.  Use tylenol / Ibuprofen for fever Albuterol inhaler - 2 puffs every 4 hours as needed for coughing Dayquil / Nyquil as needed for upper respiratory symptoms.

## 2020-07-27 ENCOUNTER — Emergency Department (HOSPITAL_COMMUNITY)
Admission: EM | Admit: 2020-07-27 | Discharge: 2020-07-28 | Disposition: A | Discharge status: Home / Self Care | Payer: 59 | Attending: Emergency Medicine | Admitting: Emergency Medicine

## 2020-07-27 ENCOUNTER — Encounter (HOSPITAL_COMMUNITY): Payer: Self-pay

## 2020-07-27 ENCOUNTER — Other Ambulatory Visit: Payer: Self-pay

## 2020-07-27 ENCOUNTER — Emergency Department (HOSPITAL_COMMUNITY): Payer: 59

## 2020-07-27 DIAGNOSIS — F17220 Nicotine dependence, chewing tobacco, uncomplicated: Secondary | ICD-10-CM | POA: Diagnosis not present

## 2020-07-27 DIAGNOSIS — Z8616 Personal history of COVID-19: Secondary | ICD-10-CM | POA: Insufficient documentation

## 2020-07-27 DIAGNOSIS — I10 Essential (primary) hypertension: Secondary | ICD-10-CM | POA: Diagnosis not present

## 2020-07-27 DIAGNOSIS — Z79899 Other long term (current) drug therapy: Secondary | ICD-10-CM | POA: Insufficient documentation

## 2020-07-27 DIAGNOSIS — R079 Chest pain, unspecified: Secondary | ICD-10-CM | POA: Diagnosis present

## 2020-07-27 DIAGNOSIS — R0789 Other chest pain: Secondary | ICD-10-CM

## 2020-07-27 DIAGNOSIS — E119 Type 2 diabetes mellitus without complications: Secondary | ICD-10-CM | POA: Insufficient documentation

## 2020-07-27 DIAGNOSIS — Z8673 Personal history of transient ischemic attack (TIA), and cerebral infarction without residual deficits: Secondary | ICD-10-CM | POA: Diagnosis not present

## 2020-07-27 NOTE — ED Triage Notes (Signed)
Pt to er room number 7, pt states that he is here for chest pain and also htn, states that he has a stress test scheduled for Friday, but the pain was too bad.  States that it is a constant pain, heaviness in his chest.

## 2020-07-28 LAB — COMPREHENSIVE METABOLIC PANEL
ALT: 27 U/L (ref 0–44)
AST: 17 U/L (ref 15–41)
Albumin: 3.7 g/dL (ref 3.5–5.0)
Alkaline Phosphatase: 43 U/L (ref 38–126)
Anion gap: 8 (ref 5–15)
BUN: 15 mg/dL (ref 6–20)
CO2: 23 mmol/L (ref 22–32)
Calcium: 8.3 mg/dL — ABNORMAL LOW (ref 8.9–10.3)
Chloride: 108 mmol/L (ref 98–111)
Creatinine, Ser: 0.9 mg/dL (ref 0.61–1.24)
GFR, Estimated: >60 mL/min (ref >60)
Glucose, Bld: 141 mg/dL — ABNORMAL HIGH (ref 70–99)
Potassium: 3.6 mmol/L (ref 3.5–5.1)
Sodium: 139 mmol/L (ref 135–145)
Total Bilirubin: 0.6 mg/dL (ref 0.3–1.2)
Total Protein: 6.4 g/dL — ABNORMAL LOW (ref 6.5–8.1)

## 2020-07-28 LAB — CBC WITH DIFFERENTIAL/PLATELET
Abs Immature Granulocytes: 0.02 10*3/uL (ref 0.00–0.07)
Basophils Absolute: 0.1 10*3/uL (ref 0.0–0.1)
Basophils Relative: 1 %
Eosinophils Absolute: 0.3 10*3/uL (ref 0.0–0.5)
Eosinophils Relative: 4 %
HCT: 47 % (ref 39.0–52.0)
Hemoglobin: 15.5 g/dL (ref 13.0–17.0)
Immature Granulocytes: 0 %
Lymphocytes Relative: 25 %
Lymphs Abs: 1.7 10*3/uL (ref 0.7–4.0)
MCH: 29.2 pg (ref 26.0–34.0)
MCHC: 33 g/dL (ref 30.0–36.0)
MCV: 88.7 fL (ref 80.0–100.0)
Monocytes Absolute: 0.7 10*3/uL (ref 0.1–1.0)
Monocytes Relative: 9 %
Neutro Abs: 4.2 10*3/uL (ref 1.7–7.7)
Neutrophils Relative %: 61 %
Platelets: 204 10*3/uL (ref 150–400)
RBC: 5.3 MIL/uL (ref 4.22–5.81)
RDW: 13.2 % (ref 11.5–15.5)
WBC: 7 10*3/uL (ref 4.0–10.5)
nRBC: 0 % (ref 0.0–0.2)

## 2020-07-28 LAB — TROPONIN I (HIGH SENSITIVITY)
Troponin I (High Sensitivity): 2 ng/L (ref <18)
Troponin I (High Sensitivity): 2 ng/L (ref <18)

## 2020-07-28 MED ORDER — NITROGLYCERIN 2 % TD OINT
1.0000 [in_us] | TOPICAL_OINTMENT | Freq: Once | TRANSDERMAL | Status: AC
  Administered 2020-07-28: 1 [in_us] via TOPICAL
  Filled 2020-07-28: qty 1

## 2020-07-28 MED ORDER — ASPIRIN 325 MG PO TABS
325.0000 mg | ORAL_TABLET | Freq: Once | ORAL | Status: AC
  Administered 2020-07-28: 325 mg via ORAL
  Filled 2020-07-28: qty 1

## 2020-07-28 MED ORDER — CYCLOBENZAPRINE HCL 5 MG PO TABS: 5.0000 mg | ORAL_TABLET | Freq: Three times a day (TID) | ORAL | 0 refills | Status: DC | PRN

## 2020-07-28 NOTE — ED Provider Notes (Signed)
[facility] EMERGENCY DEPARTMENT Provider Note   CSN: [id number] Arrival date & time: 07/27/20  2229   Time seen 12:28 AM  History Chief Complaint  Patient presents with  . Chest Pain    Gary Wyatt is a 49 y.o. male.  HPI   Patient had a positive Covid test on September 24.  He received monoclonal antibodies on September 25.  He states he still has some dyspnea on exertion at times.  He has a history of hypertension and states he takes lisinopril and amlodipine but he states last week at work his face was flushed so he had them check his blood pressure and it was elevated.  He was off all week because of his hypertension.  He states the past week he started having chest pain that is located in the center of his chest and described as pressure.  It lasts 20 to 30 minutes at a time.  It can happen with rest or exertion.  Nothing he does makes it feel worse, nothing he does makes it feel better.  He has some nausea with it but no vomiting.  He has had some mild diaphoresis.  He states he had an episode tonight that started about 7:30 PM and is still present.  Patient states he is scheduled to have a stress test done on October 22 at [facility].  He states he has a history of diabetes and hypertension, he is unsure of high cholesterol.  He does not smoke.  He states his father has some type of heart problem but he does not know what kind, his maternal grandfather died of MI at age 63.  PCP Salley Slaughter, NP   Past Medical History:  Diagnosis Date  . Anxiety   . Depression   . Diabetes mellitus (HCC)    type 2  . Hypertension   . Insomnia   . Kidney stone   . Narcolepsy   . TIA (transient ischemic attack)     Patient Active Problem List   Diagnosis Date Noted  . Headache   . Atypical chest pain 10/24/2014  . Hypertension 10/24/2014    History reviewed. No pertinent surgical history.     Family History  Problem Relation Age of Onset  . Hypertension Mother   .  Diabetes Father   . CAD Father     Social History   Tobacco Use  . Smoking status: Never Smoker  . Smokeless tobacco: Current User    Types: Snuff  Substance Use Topics  . Alcohol use: Yes  . Drug use: No  employed  Home Medications Prior to Admission medications   Medication Sig Start Date End Date Taking? Authorizing Provider  CIALIS 10 MG tablet Take 1 tablet by mouth daily as needed for erectile dysfunction. 06/02/16   [provider]  clonazePAM (KLONOPIN) 1 MG tablet Take 1 mg by mouth at bedtime. 10/22/14   [provider]  cyclobenzaprine (FLEXERIL) 5 MG tablet Take 1 tablet (5 mg total) by mouth 3 (three) times daily as needed. 07/28/20   Devoria Albe, MD  ibuprofen (ADVIL,MOTRIN) 200 MG tablet Take 400 mg by mouth every 6 (six) hours as needed for headache.    [provider]  Ibuprofen-Diphenhydramine HCl (ADVIL PM) 200-25 MG CAPS Take 1 capsule by mouth daily as needed (sleep).    [provider]  lisinopril-hydrochlorothiazide (PRINZIDE,ZESTORETIC) 10-12.5 MG tablet Take 1 tablet by mouth daily. 05/18/16   [provider]  methylphenidate (RITALIN) 20 MG  tablet Take 20 mg by mouth 2 (two) times daily.     [provider]  omeprazole (PRILOSEC) 20 MG capsule Take 1 capsule by mouth 2 (two) times daily. 04/20/16   [provider]  omeprazole-sodium bicarbonate (ZEGERID) 40-1100 MG capsule Take 1 capsule by mouth daily. 05/21/16   [provider]  ondansetron (ZOFRAN ODT) 4 MG disintegrating tablet 4mg  ODT q4 hours prn nausea/vomit 06/05/16   Blane OharaZavitz, Joshua, MD  Amlodipine 10 mg daily  Allergies    Demerol [meperidine]  Review of Systems   Review of Systems  All other systems reviewed and are negative.   Physical Exam Updated Vital Signs BP (!) 158/100   Pulse 72   Temp 98.4 F (36.9 C) (Oral)   Resp 20   Ht 6\' 3"  (1.905 m)   Wt 99.3 kg   SpO2 99%   BMI 27.37 kg/m   Physical Exam Vitals and  nursing note reviewed.  Constitutional:      General: He is not in acute distress.    Appearance: He is obese.  HENT:     Head: Normocephalic and atraumatic.     Right Ear: External ear normal.     Left Ear: External ear normal.  Eyes:     Extraocular Movements: Extraocular movements intact.     Conjunctiva/sclera: Conjunctivae normal.     Pupils: Pupils are equal, round, and reactive to light.  Cardiovascular:     Rate and Rhythm: Normal rate and regular rhythm.     Pulses: Normal pulses.     Heart sounds: Normal heart sounds. No murmur heard.   Pulmonary:     Effort: Pulmonary effort is normal.     Breath sounds: Normal breath sounds.  Chest:     Chest wall: No tenderness.  Musculoskeletal:        General: No swelling.     Cervical back: Normal range of motion.     Right lower leg: No edema.     Left lower leg: No edema.  Skin:    General: Skin is warm and dry.  Neurological:     General: No focal deficit present.     Mental Status: He is alert and oriented to person, place, and time.     Cranial Nerves: No cranial nerve deficit.  Psychiatric:        Mood and Affect: Mood normal.        Behavior: Behavior normal.        Thought Content: Thought content normal.     ED Results / Procedures / Treatments   Labs (all labs ordered are listed, but only abnormal results are displayed) Results for orders placed or performed during the hospital encounter of 07/27/20  CBC with Differential  Result Value Ref Range   WBC 7.0 4.0 - 10.5 K/uL   RBC 5.30 4.22 - 5.81 MIL/uL   Hemoglobin 15.5 13.0 - 17.0 g/dL   HCT 16.147.0 39 - 52 %   MCV 88.7 80.0 - 100.0 fL   MCH 29.2 26.0 - 34.0 pg   MCHC 33.0 30.0 - 36.0 g/dL   RDW 09.613.2 04.511.5 - 40.915.5 %   Platelets 204 150 - 400 K/uL   nRBC 0.0 0.0 - 0.2 %   Neutrophils Relative % 61 %   Neutro Abs 4.2 1.7 - 7.7 K/uL   Lymphocytes Relative 25 %   Lymphs Abs 1.7 0.7 - 4.0 K/uL   Monocytes Relative 9 %   Monocytes Absolute 0.7 0.1 -  1.0 K/uL     Eosinophils Relative 4 %   Eosinophils Absolute 0.3 0.0 - 0.5 K/uL   Basophils Relative 1 %   Basophils Absolute 0.1 0.0 - 0.1 K/uL   Immature Granulocytes 0 %   Abs Immature Granulocytes 0.02 0.00 - 0.07 K/uL  Comprehensive metabolic panel  Result Value Ref Range   Sodium 139 135 - 145 mmol/L   Potassium 3.6 3.5 - 5.1 mmol/L   Chloride 108 98 - 111 mmol/L   CO2 23 22 - 32 mmol/L   Glucose, Bld 141 (H) 70 - 99 mg/dL   BUN 15 6 - 20 mg/dL   Creatinine, Ser 9.47 0.61 - 1.24 mg/dL   Calcium 8.3 (L) 8.9 - 10.3 mg/dL   Total Protein 6.4 (L) 6.5 - 8.1 g/dL   Albumin 3.7 3.5 - 5.0 g/dL   AST 17 15 - 41 U/L   ALT 27 0 - 44 U/L   Alkaline Phosphatase 43 38 - 126 U/L   Total Bilirubin 0.6 0.3 - 1.2 mg/dL   GFR, Estimated >09 >62 mL/min   Anion gap 8 5 - 15  Troponin I (High Sensitivity)  Result Value Ref Range   Troponin I (High Sensitivity) 2 <18 ng/L  Troponin I (High Sensitivity)  Result Value Ref Range   Troponin I (High Sensitivity) 2 <18 ng/L   Laboratory interpretation all normal except nonfasting hyperglycemia    EKG EKG Interpretation  Date/Time:  Wednesday July 27 2020 22:43:06 EDT Ventricular Rate:  85 PR Interval:    QRS Duration: 80 QT Interval:  369 QTC Calculation: 439 R Axis:   45 Text Interpretation: Sinus rhythm Electrode noise No significant change since last tracing 24 Oct 2014 Confirmed by Devoria Albe ([id number]) on 07/27/2020 11:06:03 PM   Radiology DG Chest Port 1 View  Result Date: 07/27/2020 CLINICAL DATA:  History of COVID-19 positivity with chest pain EXAM: PORTABLE CHEST 1 VIEW COMPARISON:  07/02/2020 FINDINGS: Cardiac shadow is within normal limits. The lungs are hypoinflated. Some crowding of the vascular markings is seen. No sizable effusion or infiltrate is noted. No bony abnormality is seen. IMPRESSION: Poor inspiratory effort.  No acute abnormality seen. Electronically Signed   By: Alcide Clever M.D.   On: 07/27/2020 23:36     Procedures Procedures (including critical care time)  Medications Ordered in ED Medications  nitroGLYCERIN (NITROGLYN) 2 % ointment 1 inch (1 inch Topical Given 07/28/20 0118)  aspirin tablet 325 mg (325 mg Oral Given 07/28/20 0119)    ED Course  I have reviewed the triage vital signs and the nursing notes.  Pertinent labs & imaging results that were available during my care of the patient were reviewed by me and considered in my medical decision making (see chart for details).    MDM Rules/Calculators/A&P                          Patient's blood pressure at time of my exam was 174/104.  He had nitroglycerin paste placed to his chest and was given aspirin 325 mg.  Laboratory testing was done to evaluate him for cardiac chest pain.  2:30 AM patient's blood pressure is 158/100.  Recheck at 3:50 AM patient is sitting on the side of the bed, stating he is ready to go home. His systolic blood pressures back up into the 170s. He states his chest pain is a little bit better. We discussed calling his primary care doctor to have  them address his hypertension. He does not have an appointment with the cardiologist tomorrow only the stress test. If his blood pressure is high they probably will be able to do it.   Final Clinical Impression(s) / ED Diagnoses Final diagnoses:  Atypical chest pain  Hypertension, unspecified type    Rx / DC Orders ED Discharge Orders         Ordered    cyclobenzaprine (FLEXERIL) 5 MG tablet  3 times daily PRN        07/28/20 0404         Plan discharge  Devoria Albe, MD, Concha Pyo, MD 07/28/20 [phone]

## 2020-07-28 NOTE — Discharge Instructions (Signed)
Call your doctors office today to tell them you are still having the high blood pressure and the ED wanted you to call for them to adjust your medications. Keep your appointment tomorrow to get the stress test done. Try the Flexeril for your chest pain. Your test tonight show you are not having a heart attack. If you had Covid pneumonia it has resolved by your x-ray tonight.

## 2021-01-16 ENCOUNTER — Encounter: Payer: Self-pay | Admitting: *Deleted

## 2021-01-16 ENCOUNTER — Encounter: Payer: Self-pay | Admitting: Cardiology

## 2021-01-16 ENCOUNTER — Ambulatory Visit (INDEPENDENT_AMBULATORY_CARE_PROVIDER_SITE_OTHER): Payer: 59 | Admitting: Cardiology

## 2021-01-16 VITALS — BP 154/90 | HR 86 | Ht 74.0 in | Wt 226.6 lb

## 2021-01-16 DIAGNOSIS — R0789 Other chest pain: Secondary | ICD-10-CM

## 2021-01-16 DIAGNOSIS — I10 Essential (primary) hypertension: Secondary | ICD-10-CM | POA: Diagnosis not present

## 2021-01-16 MED ORDER — VALSARTAN 320 MG PO TABS: 320.0000 mg | ORAL_TABLET | Freq: Every day | ORAL | 1 refills | Status: DC

## 2021-01-16 MED ORDER — CHLORTHALIDONE 25 MG PO TABS: 25.0000 mg | ORAL_TABLET | Freq: Every day | ORAL | 1 refills | Status: DC

## 2021-01-16 NOTE — Patient Instructions (Addendum)
Your physician recommends that you schedule a follow-up appointment in: 2 MONTHS WITH DR BRANCH OR EXTENDER  Your physician has recommended you make the following change in your medication:   STOP VALSARATN-HCTZ  START VALSARTAN 320 MG DAILY   START CHLORTHALIDONE 25 MG DAILY   CALL us Monday WITH HOME BLOOD PRESSURE READINGS  Your physician recommends that you return for lab work in 2 WEEKS BMP/MG/RENIN/ALDASTERONE/TSH   [email or web address]">  DASH Eating Plan DASH stands for Dietary Approaches to Stop Hypertension. The DASH eating plan is a healthy eating plan that has been shown to:  Reduce high blood pressure (hypertension).  Reduce your risk for type 2 diabetes, heart disease, and stroke.  Help with weight loss. What are tips for following this plan? Reading food labels  Check food labels for the amount of salt (sodium) per serving. Choose foods with less than 5 percent of the Daily Value of sodium. Generally, foods with less than 300 milligrams (mg) of sodium per serving fit into this eating plan.  To find whole grains, look for the word "whole" as the first word in the ingredient list. Shopping  Buy products labeled as "low-sodium" or "no salt added."  Buy fresh foods. Avoid canned foods and pre-made or frozen meals. Cooking  Avoid adding salt when cooking. Use salt-free seasonings or herbs instead of table salt or sea salt. Check with your health care provider or pharmacist before using salt substitutes.  Do not fry foods. Cook foods using healthy methods such as baking, boiling, grilling, roasting, and broiling instead.  Cook with heart-healthy oils, such as olive, canola, avocado, soybean, or sunflower oil. Meal planning  Eat a balanced diet that includes: ? 4 or more servings of fruits and 4 or more servings of vegetables each day. Try to fill one-half of your plate with fruits and vegetables. ? 6-8 servings of  whole grains each day. ? Less than 6 oz (170 g) of lean meat, poultry, or fish each day. A 3-oz (85-g) serving of meat is about the same size as a deck of cards. One egg equals 1 oz (28 g). ? 2-3 servings of low-fat dairy each day. One serving is 1 cup (237 mL). ? 1 serving of nuts, seeds, or beans 5 times each week. ? 2-3 servings of heart-healthy fats. Healthy fats called omega-3 fatty acids are found in foods such as walnuts, flaxseeds, fortified milks, and eggs. These fats are also found in cold-water fish, such as sardines, salmon, and mackerel.  Limit how much you eat of: ? Canned or prepackaged foods. ? Food that is high in trans fat, such as some fried foods. ? Food that is high in saturated fat, such as fatty meat. ? Desserts and other sweets, sugary drinks, and other foods with added sugar. ? Full-fat dairy products.  Do not salt foods before eating.  Do not eat more than 4 egg yolks a week.  Try to eat at least 2 vegetarian meals a week.  Eat more home-cooked food and less restaurant, buffet, and fast food.   Lifestyle  When eating at a restaurant, ask that your food be prepared with less salt or no salt, if possible.  If you drink alcohol: ? Limit how much you use to:  0-1 drink a day for women who are not pregnant.  0-2 drinks a day for men. ? Be aware of how much alcohol is in your drink. In the [place] one drink equals one 12 oz bottle of beer (355  mL), one 5 oz glass of wine (148 mL), or one 1 oz glass of hard liquor (44 mL). General information  Avoid eating more than 2,300 mg of salt a day. If you have hypertension, you may need to reduce your sodium intake to 1,500 mg a day.  Work with your health care provider to maintain a healthy body weight or to lose weight. Ask what an ideal weight is for you.  Get at least 30 minutes of exercise that causes your heart to beat faster (aerobic exercise) most days of the week. Activities may include walking, swimming, or  biking.  Work with your health care provider or dietitian to adjust your eating plan to your individual calorie needs. What foods should I eat? Fruits All fresh, dried, or frozen fruit. Canned fruit in natural juice (without added sugar). Vegetables Fresh or frozen vegetables (raw, steamed, roasted, or grilled). Low-sodium or reduced-sodium tomato and vegetable juice. Low-sodium or reduced-sodium tomato sauce and tomato paste. Low-sodium or reduced-sodium canned vegetables. Grains Whole-grain or whole-wheat bread. Whole-grain or whole-wheat pasta. Brown rice. [name]. Bulgur. Whole-grain and low-sodium cereals. Pita bread. Low-fat, low-sodium crackers. Whole-wheat flour tortillas. Meats and other proteins Skinless chicken or [place]. Ground chicken or [place]. Pork with fat trimmed off. Fish and seafood. Egg whites. Dried beans, peas, or lentils. Unsalted nuts, nut butters, and seeds. Unsalted canned beans. Lean cuts of beef with fat trimmed off. Low-sodium, lean precooked or cured meat, such as sausages or meat loaves. Dairy Low-fat (1%) or fat-free (skim) milk. Reduced-fat, low-fat, or fat-free cheeses. Nonfat, low-sodium ricotta or cottage cheese. Low-fat or nonfat yogurt. Low-fat, low-sodium cheese. Fats and oils Soft margarine without trans fats. Vegetable oil. Reduced-fat, low-fat, or light mayonnaise and salad dressings (reduced-sodium). Canola, safflower, olive, avocado, soybean, and sunflower oils. Avocado. Seasonings and condiments Herbs. Spices. Seasoning mixes without salt. Other foods Unsalted popcorn and pretzels. Fat-free sweets. The items listed above may not be a complete list of foods and beverages you can eat. Contact a dietitian for more information. What foods should I avoid? Fruits Canned fruit in a light or heavy syrup. Fried fruit. Fruit in cream or butter sauce. Vegetables Creamed or fried vegetables. Vegetables in a cheese sauce. Regular canned vegetables (not  low-sodium or reduced-sodium). Regular canned tomato sauce and paste (not low-sodium or reduced-sodium). Regular tomato and vegetable juice (not low-sodium or reduced-sodium). [name]. Olives. Grains Baked goods made with fat, such as croissants, muffins, or some breads. Dry pasta or rice meal packs. Meats and other proteins Fatty cuts of meat. Ribs. Fried meat. Tomasa Blase. Bologna, salami, and other precooked or cured meats, such as sausages or meat loaves. Fat from the back of a pig (fatback). Bratwurst. Salted nuts and seeds. Canned beans with added salt. Canned or smoked fish. Whole eggs or egg yolks. Chicken or [place] with skin. Dairy Whole or 2% milk, cream, and half-and-half. Whole or full-fat cream cheese. Whole-fat or sweetened yogurt. Full-fat cheese. Nondairy creamers. Whipped toppings. Processed cheese and cheese spreads. Fats and oils Butter. Stick margarine. Lard. Shortening. Ghee. Bacon fat. Tropical oils, such as coconut, palm kernel, or palm oil. Seasonings and condiments Onion salt, garlic salt, seasoned salt, table salt, and sea salt. Worcestershire sauce. Tartar sauce. Barbecue sauce. Teriyaki sauce. Soy sauce, including reduced-sodium. Steak sauce. Canned and packaged gravies. Fish sauce. Oyster sauce. Cocktail sauce. Store-bought horseradish. Ketchup. Mustard. Meat flavorings and tenderizers. Bouillon cubes. Hot sauces. Pre-made or packaged marinades. Pre-made or packaged taco seasonings. Relishes. Regular salad dressings. Other foods  Salted popcorn and pretzels. The items listed above may not be a complete list of foods and beverages you should avoid. Contact a dietitian for more information. Where to find more information  National Heart, Lung, and Blood Institute: PopSteam.is  American Heart Association: www.heart.org  Academy of Nutrition and Dietetics: www.eatright.org  National Kidney Foundation: www.kidney.org Summary  The DASH eating plan is a healthy eating  plan that has been shown to reduce high blood pressure (hypertension). It may also reduce your risk for type 2 diabetes, heart disease, and stroke.  When on the DASH eating plan, aim to eat more fresh fruits and vegetables, whole grains, lean proteins, low-fat dairy, and heart-healthy fats.  With the DASH eating plan, you should limit salt (sodium) intake to 2,300 mg a day. If you have hypertension, you may need to reduce your sodium intake to 1,500 mg a day.  Work with your health care provider or dietitian to adjust your eating plan to your individual calorie needs. This information is not intended to replace advice given to you by your health care provider. Make sure you discuss any questions you have with your health care provider. Document Revised: 08/28/2019 Document Reviewed: 08/28/2019 Elsevier Patient Education  2021 [employer].

## 2021-01-16 NOTE — Progress Notes (Signed)
Clinical Summary Mr. Siefring is a 50 y.o.male seen as a new consult, referred by Dr Sherril Croon for the following medical problems.  1. HTN - compliant with meds - home bp's 160-180s/90s-110s   - no snoring, no witnessed apneic episodes, some chronic fatigue/hypersomnolence - no significant NSAID use - just occasional EtOH, once a month   SH: heading to [place] for easter weekend   Past Medical History:  Diagnosis Date  . Anxiety   . Depression   . Diabetes mellitus (HCC)    type 2  . Hypertension   . Insomnia   . Kidney stone   . Narcolepsy   . TIA (transient ischemic attack)      Allergies  Allergen Reactions  . Demerol [Meperidine] Itching and Other (See Comments)    irritation     Current Outpatient Medications  Medication Sig Dispense Refill  . CIALIS 10 MG tablet Take 1 tablet by mouth daily as needed for erectile dysfunction.    . clonazePAM (KLONOPIN) 1 MG tablet Take 1 mg by mouth at bedtime.    . cyclobenzaprine (FLEXERIL) 5 MG tablet Take 1 tablet (5 mg total) by mouth 3 (three) times daily as needed. 30 tablet 0  . ibuprofen (ADVIL,MOTRIN) 200 MG tablet Take 400 mg by mouth every 6 (six) hours as needed for headache.    . Ibuprofen-Diphenhydramine HCl (ADVIL PM) 200-25 MG CAPS Take 1 capsule by mouth daily as needed (sleep).    Marland Kitchen lisinopril-hydrochlorothiazide (PRINZIDE,ZESTORETIC) 10-12.5 MG tablet Take 1 tablet by mouth daily.    . methylphenidate (RITALIN) 20 MG tablet Take 20 mg by mouth 2 (two) times daily.     Marland Kitchen omeprazole (PRILOSEC) 20 MG capsule Take 1 capsule by mouth 2 (two) times daily.    Marland Kitchen omeprazole-sodium bicarbonate (ZEGERID) 40-1100 MG capsule Take 1 capsule by mouth daily.    . ondansetron (ZOFRAN ODT) 4 MG disintegrating tablet 4mg  ODT q4 hours prn nausea/vomit 4 tablet 0   No current facility-administered medications for this visit.     No past surgical history on file.   Allergies  Allergen Reactions  . Demerol [Meperidine]  Itching and Other (See Comments)    irritation      Family History  Problem Relation Age of Onset  . Hypertension Mother   . Diabetes Father   . CAD Father      Social History Mr. Cybulski reports that he has never smoked. His smokeless tobacco use includes snuff. Mr. Ambroise reports current alcohol use.   Review of Systems CONSTITUTIONAL: No weight loss, fever, chills, weakness or fatigue.  HEENT: Eyes: No visual loss, blurred vision, double vision or yellow sclerae.No hearing loss, sneezing, congestion, runny nose or sore throat.  SKIN: No rash or itching.  CARDIOVASCULAR: per hpi RESPIRATORY: No shortness of breath, cough or sputum.  GASTROINTESTINAL: No anorexia, nausea, vomiting or diarrhea. No abdominal pain or blood.  GENITOURINARY: No burning on urination, no polyuria NEUROLOGICAL: No headache, dizziness, syncope, paralysis, ataxia, numbness or tingling in the extremities. No change in bowel or bladder control.  MUSCULOSKELETAL: No muscle, back pain, joint pain or stiffness.  LYMPHATICS: No enlarged nodes. No history of splenectomy.  PSYCHIATRIC: No history of depression or anxiety.  ENDOCRINOLOGIC: No reports of sweating, cold or heat intolerance. No polyuria or polydipsia.  Lowell Guitar   Physical Examination Today's Vitals   01/16/21 0852  BP: (!) 154/90  Pulse: 86  SpO2: 98%  Weight: 226 lb 9.6 oz (102.8 kg)  Height:  6\' 2"  (1.88 m)   Body mass index is 29.09 kg/m.  Gen: resting comfortably, no acute distress HEENT: no scleral icterus, pupils equal round and reactive, no palptable cervical adenopathy,  CV: RRR, no m/r/g no jvd Resp: Clear to auscultation bilaterally GI: abdomen is soft, non-tender, non-distended, normal bowel sounds, no hepatosplenomegaly MSK: extremities are warm, no edema.  Skin: warm, no rash Neuro:  no focal deficits Psych: appropriate affect   Diagnostic Studies 07/2020 nuclear stress [facility] 1. No reversible ischemia or infarction.   2.  Normal left ventricular wall motion.   3. Left ventricular ejection fraction 56%   4. Non invasive risk stratification*: Low       Assessment and Plan  1. HTN - difficult to control bp's - stop diovan HCT. Start valsartan by itself 320 mg daily and chlorthlaidone 25mg  daily - in 2 weeks check bmet/mg/tsh/renin/aldo ratio - he will update 08/2020 on bp's in 1 week. Could consider changing toprol to more bp effective beta blocker like coreg or labetalol, could consider aldactone for resistant HTN as well - if ongoing issues with bp control would consider sleep study, renal artery - given info on DASH diet, discussed low sodium diet  - work note given as he reports cannot work unless bp <150/100, will take a few days for these changes to take effect, excuse given for Mon through Thurs this week.   F/u 2 months     [place], M.D.

## 2021-01-17 ENCOUNTER — Telehealth: Payer: Self-pay | Admitting: Cardiology

## 2021-01-17 NOTE — Telephone Encounter (Signed)
Please give pt a call- would like to know if he's supposed to take all of his medications at one time, either in the morning or at night. He does work 3rd shift.    [phone]

## 2021-01-17 NOTE — Telephone Encounter (Signed)
Reports taking previous diovan/hct at 9:00 pm just before going to work. Advised that he can also take plain diovan and chlorthalidone at the same time he took diovan/hct 9:00 pm since he worked 3rd shift and this was the usual time. Verbalized understanding.

## 2021-01-23 ENCOUNTER — Telehealth: Payer: Self-pay | Admitting: Cardiology

## 2021-01-23 MED ORDER — LABETALOL HCL 200 MG PO TABS: 200.0000 mg | ORAL_TABLET | Freq: Two times a day (BID) | ORAL | 3 refills | Status: DC

## 2021-01-23 NOTE — Telephone Encounter (Signed)
BP readings:  Tues 4/12 = 174/95 Wed 4/13 = 174/80 Thurs 4/14 = 147/74 Fri 4/15 = 181/101  Sat 4/16 = 181/101 Sun 4/17 = 181/101 Today 4/18 = 181/101  Patient is supposed to return to work today but concerned they will send him back home because his readings are not around 154/89. He goes to have lab work on 4/25 and wasn't sure if he wanted to keep him out until then. Please call him at #[phone]

## 2021-01-23 NOTE — Telephone Encounter (Signed)
Patient verbalized understanding and gave the number to have his work note faxed.

## 2021-01-23 NOTE — Telephone Encounter (Signed)
Can extend work excuse to 4/25. STop toprol, start labetalol 200mg  bid. Update again on bp's on Thursday   J Auden Wettstein MD

## 2021-01-24 NOTE — Telephone Encounter (Signed)
Patient called and stated that he has a gap in the dates he was out of work. Patient would like a note to return to work on 01/31/2021 so that he can be paid for a full week. Please advise.

## 2021-01-24 NOTE — Telephone Encounter (Signed)
Noted. Letter faxed to patient's work as requested by patient.

## 2021-01-24 NOTE — Telephone Encounter (Signed)
Patient called requesting to speak with Dr. [name] in regards to extending his work note. Please call [phone].

## 2021-01-24 NOTE — Telephone Encounter (Signed)
Please accomodate whatever dates he needs covered until bp's get controlled   Josue Hector MD

## 2021-01-25 ENCOUNTER — Other Ambulatory Visit: Payer: Self-pay | Admitting: Cardiology

## 2021-01-26 ENCOUNTER — Telehealth: Payer: Self-pay | Admitting: Cardiology

## 2021-01-26 DIAGNOSIS — Z0279 Encounter for issue of other medical certificate: Secondary | ICD-10-CM

## 2021-01-26 NOTE — Telephone Encounter (Signed)
Patient called stating that the new BP medication is making him fatigue with shortness of breath Labetalol 200 mg.  [phone]

## 2021-01-27 ENCOUNTER — Telehealth: Payer: Self-pay | Admitting: Cardiology

## 2021-01-27 NOTE — Telephone Encounter (Signed)
Forms from [employer] [facility] received on 01/26/21. Completed patient auth attached. Took form to MD Box for completion. ABB-01/27/21

## 2021-01-27 NOTE — Telephone Encounter (Signed)
Pt is returning call   [phone]

## 2021-01-30 NOTE — Telephone Encounter (Signed)
Patient states that since taking Labetalol 200 mg tablets his has noticed he is more fatigued and short of breath. Patient also c/o tingling in his face/jaws. Patient stated his bp's are as follows: 4/22- 177/98 4/23- 147/88 4/24- 147/77  Suggested to patient that a bp log would be helpful since he has not kept a record of bp's.   Patient also would like to know if Dr. [name] thinks he should still go back to work tonight.  Please advise.

## 2021-01-31 NOTE — Telephone Encounter (Signed)
BP's are improving, would give the labetalol some time to settle in before we decide about making a change. Would try going back to work and see how he does   Dominga Ferry MD

## 2021-01-31 NOTE — Telephone Encounter (Signed)
Error

## 2021-02-01 LAB — ALDOSTERONE + RENIN ACTIVITY W/ RATIO
ALDO / PRA Ratio: 18.5 Ratio (ref 0.9–28.9)
Aldosterone: 5 ng/dL
Renin Activity: 0.27 ng/mL/h (ref 0.25–5.82)

## 2021-02-01 LAB — BASIC METABOLIC PANEL WITH GFR
BUN: 14 mg/dL (ref 7–25)
CO2: 25 mmol/L (ref 20–32)
Calcium: 9 mg/dL (ref 8.6–10.3)
Chloride: 103 mmol/L (ref 98–110)
Creat: 0.88 mg/dL (ref 0.70–1.33)
GFR, Est African American: 114 mL/min/{1.73_m2} (ref >=60)
GFR, Est Non African American: 99 mL/min/{1.73_m2} (ref >=60)
Glucose, Bld: 204 mg/dL — ABNORMAL HIGH (ref 65–139)
Potassium: 3.7 mmol/L (ref 3.5–5.3)
Sodium: 138 mmol/L (ref 135–146)

## 2021-02-01 LAB — TSH: TSH: 5.19 mIU/L — ABNORMAL HIGH (ref 0.40–4.50)

## 2021-02-01 LAB — MAGNESIUM: Magnesium: 1.9 mg/dL (ref 1.5–2.5)

## 2021-02-07 ENCOUNTER — Telehealth: Payer: Self-pay | Admitting: *Deleted

## 2021-02-07 DIAGNOSIS — R7989 Other specified abnormal findings of blood chemistry: Secondary | ICD-10-CM

## 2021-02-07 NOTE — Telephone Encounter (Signed)
Pt aware and will have labs done at [facility] in [place] - lab orders placed

## 2021-02-07 NOTE — Telephone Encounter (Signed)
-----   Message from Antoine Poche, MD sent at 02/06/2021 12:23 PM EDT ----- Labs overall look good. Thyroid test mildly abnormal, needs additional blood testing with a T3 and a free T4 to get more information   J BranchMD

## 2021-02-15 ENCOUNTER — Telehealth: Payer: Self-pay | Admitting: *Deleted

## 2021-02-15 LAB — T4, FREE: Free T4: 1.1 ng/dL (ref 0.8–1.8)

## 2021-02-15 LAB — T3: T3, Total: 135 ng/dL (ref 76–181)

## 2021-02-15 NOTE — Telephone Encounter (Signed)
-----   Message from Antoine Poche, MD sent at 02/15/2021  8:49 AM EDT ----- Normal thyroid studies  Dominga Ferry MD

## 2021-02-15 NOTE — Telephone Encounter (Signed)
Pt returning Gary Wyatt, CMA's call. Pt notified of lab results and voiced understanding. Pt had no questons or concerns at this time.

## 2021-02-21 ENCOUNTER — Telehealth: Payer: Self-pay | Admitting: *Deleted

## 2021-02-21 NOTE — Telephone Encounter (Signed)
Pt made aware by [facility] staffing of lab results

## 2021-02-22 ENCOUNTER — Telehealth: Payer: Self-pay

## 2021-02-22 NOTE — Telephone Encounter (Signed)
Pt called stating the labetalol 200 mg tablets that he takes twice a day is making him very tired. Pt denies SOB, CP, and thoracic pressure.

## 2021-02-23 NOTE — Telephone Encounter (Signed)
LMTCB

## 2021-02-23 NOTE — Telephone Encounter (Signed)
Is the tablet scored? Is he able to take half a tablet (100mg ) twice daily to see if better tolerated  J Lucio Litsey MD

## 2021-03-07 ENCOUNTER — Telehealth: Payer: Self-pay | Admitting: *Deleted

## 2021-03-07 NOTE — Telephone Encounter (Signed)
Pt notified and voiced understanding 

## 2021-03-07 NOTE — Telephone Encounter (Signed)
Try stopping the labetalol. Spread out the medications by taking the valsartan at night, can take chlorthaldione and norvasc in AM. Uupdate [place] later in the week   J Bronx Brogden MD

## 2021-03-07 NOTE — Telephone Encounter (Signed)
Pt states that after starting chlorthalidone, Labetalol, valsartan that over the weekend his blood pressure dropped. It was 88/66, 84/65, and 88/66. On today his BP is 168/92.. Pt states that when BP is low he could feel it, he be tired, dizzy, blurred vision and nausea. Pt can be reached at [phone].

## 2021-03-19 NOTE — Progress Notes (Signed)
Cardiology Office Note  Date: 03/20/2021   ID: MOHSEN Wyatt, DOB 1970/11/26, MRN [id number]  PCP:  [name], MD  Cardiologist:  [name], MD Electrophysiologist:  None   Chief Complaint: Follow up  History of Present Illness: Gary Wyatt is a 50 y.o. male with a history of hypertension, DM2, TIA.  Last seen by Dr. [name] on 01/16/2021 with difficulty controlling blood pressures.  Valsartan was started at 320 mg daily with chlorthalidone 25 mg daily.  Labs were ordered including basic metabolic panel, magnesium, renin/aldosterone ratio.  Dr. [name] noted consider changing Toprol to more effective beta-blocker like Coreg or labetalol.  Could consider Aldactone for resistant hypertension.  He mentioned if ongoing issues with blood pressure would consider sleep study, renal artery ultrasound.  He was given info on DASH diet.  Discussed low-sodium diet.   His Toprol XL was stopped and labetalol was started. His BP's were low and he was symptomatic. His Labetalol was stopped due to side effects.  He presents today stating he continues to have some chest pain mostly left precordial pain/left epigastric pain.  He denies any associated radiation to neck, arm, back, jaw.  Denies any nausea, or vomiting.  He does state he sometimes breaks out into a cold sweat.  His most recent stress test in October 2021 was considered low risk.  He denies any palpitations or arrhythmias, orthostatic symptoms, CVA or TIA-like symptoms, PND, orthopnea.  He denies any sleep apnea-like symptoms.  Blood pressure has improved some since medication changes.  BP today is 134/90.  He does state he has some shortness of breath when performing exertional activities.  He denies any bleeding issues, claudication-like symptoms, DVT or PE-like symptoms, or lower extremity edema.   Past Medical History:  Diagnosis Date   Anxiety    Depression    Diabetes mellitus (HCC)    type 2   Hypertension    Insomnia     Kidney stone    Narcolepsy    TIA (transient ischemic attack)     Past Surgical History:  Procedure Laterality Date   NO PAST SURGERIES      Current Outpatient Medications  Medication Sig Dispense Refill   ALPRAZolam (XANAX) 0.5 MG tablet Take 0.5 mg by mouth 2 (two) times daily as needed.     amLODipine (NORVASC) 10 MG tablet Take 1 tablet by mouth every morning.     aspirin 81 MG EC tablet Take 81 mg by mouth daily. Swallow whole.     chlorthalidone (HYGROTON) 25 MG tablet Take 1 tablet (25 mg total) by mouth daily. 90 tablet 1   cyclobenzaprine (FLEXERIL) 5 MG tablet Take 5 mg by mouth 3 (three) times daily as needed for muscle spasms.     ibuprofen (ADVIL,MOTRIN) 200 MG tablet Take 400 mg by mouth every 6 (six) hours as needed for headache.     Ibuprofen-diphenhydrAMINE HCl 200-25 MG CAPS Take 1 capsule by mouth daily as needed (sleep).     metFORMIN (GLUCOPHAGE) 500 MG tablet Take 500 mg by mouth 2 (two) times daily with a meal.     methylphenidate (RITALIN) 20 MG tablet Take 20 mg by mouth 2 (two) times daily.      omeprazole (PRILOSEC) 20 MG capsule Take 1 capsule by mouth daily as needed.     sildenafil (VIAGRA) 100 MG tablet Take 100 mg by mouth daily as needed.     TRESIBA FLEXTOUCH 200 UNIT/ML FlexTouch Pen Inject 24 Units into  the skin daily at 6 (six) AM.     valsartan (DIOVAN) 320 MG tablet Take 1 tablet (320 mg total) by mouth daily. 90 tablet 1   No current facility-administered medications for this visit.   Allergies:  Demerol [meperidine]   Social History: The patient  reports that he has never smoked. His smokeless tobacco use includes snuff. He reports current alcohol use. He reports that he does not use drugs.   Family History: The patient's family history includes CAD in his father; Diabetes in his father; Hypertension in his mother.   ROS:  Please see the history of present illness. Otherwise, complete review of systems is positive for none.  All other systems  are reviewed and negative.   Physical Exam: VS:  BP 134/90   Pulse 90   Ht 6\' 2"  (1.88 m)   Wt 224 lb (101.6 kg)   SpO2 98%   BMI 28.76 kg/m , BMI Body mass index is 28.76 kg/m.  Wt Readings from Last 3 Encounters:  03/20/21 224 lb (101.6 kg)  01/16/21 226 lb 9.6 oz (102.8 kg)  07/27/20 219 lb (99.3 kg)    General: Patient appears comfortable at rest. Neck: Supple, no elevated JVP or carotid bruits, no thyromegaly. Lungs: Clear to auscultation, nonlabored breathing at rest. Cardiac: Regular rate and rhythm, no S3 or significant systolic murmur, no pericardial rub. Extremities: No pitting edema, distal pulses 2+. Skin: Warm and dry. Musculoskeletal: No kyphosis. Neuropsychiatric: Alert and oriented x3, affect grossly appropriate.  ECG:    Recent Labwork: 07/28/2020: ALT 27; AST 17; Hemoglobin 15.5; Platelets 204 01/25/2021: BUN 14; Creat 0.88; Magnesium 1.9; Potassium 3.7; Sodium 138; TSH 5.19  No results found for: CHOL, TRIG, HDL, CHOLHDL, VLDL, LDLCALC, LDLDIRECT  Other Studies Reviewed Today:    Diagnostic Studies 07/2020 nuclear stress [facility] 1. No reversible ischemia or infarction.   2. Normal left ventricular wall motion.   3. Left ventricular ejection fraction 56%   4. Non invasive risk stratification*: Low     Assessment and Plan:  1. Uncontrolled hypertension   2. SOB (shortness of breath)   3. Epigastric pain    1. Uncontrolled hypertension Blood pressure has improved some since recent medication changes.  Blood pressure today 134/90.  Advised him the goal of blood pressure is 130/80 consistently.  Advised him to continue to monitor his blood pressures.  He states he gets it checked at work and usually running in this range recently.  Continue amlodipine 10 mg daily.  Chlorthalidone 25 mg daily.  Valsartan 320 mg p.o. daily.  2. SOB (shortness of breath) Voices complaints of DOE/SOB when performing more than usual ADLs.  Please get an echocardiogram to  assess LV function, diastolic function, valvular function.  3. Epigastric pain Voices issues with GERD and epigastric pain.  States he is never seen a gastroenterologist in the past.  Please refer to [facility] for evaluation.  4.  Chest pain. Voices complaints of chest pain.  Describes it as left precordial without radiation to neck, arm, back, jaw.  Denies any nausea or vomiting.  He states sometimes he can break out into his cold sweat.  He had a low risk stress test October 2021 at [facility].  He states he cannot take nitroglycerin because it drops his blood pressure significantly.   Medication Adjustments/Labs and Tests Ordered: Current medicines are reviewed at length with the patient today.  Concerns regarding medicines are outlined above.   Disposition: Follow-up with [facility]. [facility] or  APP 6 to 8 weeks.  Signed, Rennis Harding, NP 03/20/2021 9:28 AM    [facility] [address] [place], [place], [place] [address] Phone: [phone]; Fax: [phone]

## 2021-03-20 ENCOUNTER — Ambulatory Visit (INDEPENDENT_AMBULATORY_CARE_PROVIDER_SITE_OTHER): Payer: 59 | Admitting: Family Medicine

## 2021-03-20 ENCOUNTER — Encounter: Payer: Self-pay | Admitting: Family Medicine

## 2021-03-20 VITALS — BP 134/90 | HR 90 | Ht 74.0 in | Wt 224.0 lb

## 2021-03-20 DIAGNOSIS — R0602 Shortness of breath: Secondary | ICD-10-CM | POA: Diagnosis not present

## 2021-03-20 DIAGNOSIS — R1013 Epigastric pain: Secondary | ICD-10-CM

## 2021-03-20 DIAGNOSIS — R079 Chest pain, unspecified: Secondary | ICD-10-CM

## 2021-03-20 DIAGNOSIS — I10 Essential (primary) hypertension: Secondary | ICD-10-CM

## 2021-03-20 NOTE — Patient Instructions (Signed)
Medication Instructions:  Your physician recommends that you continue on your current medications as directed. Please refer to the Current Medication list given to you today.  Labwork: none  Testing/Procedures: Your physician has requested that you have an echocardiogram. Echocardiography is a painless test that uses sound waves to create images of your heart. It provides your doctor with information about the size and shape of your heart and how well your heart's chambers and valves are working. This procedure takes approximately one hour. There are no restrictions for this procedure  Follow-Up: Your physician recommends that you schedule a follow-up appointment in: 6-8 weeks  Any Other Special Instructions Will Be Listed Below (If Applicable). You have been referred to Gastroenterology  If you need a refill on your cardiac medications before your next appointment, please call your pharmacy.

## 2021-03-22 ENCOUNTER — Encounter: Payer: Self-pay | Admitting: Internal Medicine

## 2021-04-19 ENCOUNTER — Other Ambulatory Visit: Payer: 59

## 2021-05-03 ENCOUNTER — Other Ambulatory Visit: Payer: 59

## 2021-05-07 NOTE — Progress Notes (Deleted)
Cardiology Office Note  Date: 05/07/2021   ID: Gary Wyatt, DOB 1971-01-17, MRN [id number]  PCP:  [name], MD  Cardiologist:  [name], MD Electrophysiologist:  None   Chief Complaint: Follow up  History of Present Illness: Gary Wyatt is a 50 y.o. male with a history of hypertension, DM2, TIA.  Last seen by Dr. [name] on 01/16/2021 with difficulty controlling blood pressures.  Valsartan was started at 320 mg daily with chlorthalidone 25 mg daily.  Labs were ordered including basic metabolic panel, magnesium, renin/aldosterone ratio.  Dr. [name] noted consider changing Toprol to more effective beta-blocker like Coreg or labetalol.  Could consider Aldactone for resistant hypertension.  He mentioned if ongoing issues with blood pressure would consider sleep study, renal artery ultrasound.  He was given info on DASH diet.  Discussed low-sodium diet.   His Toprol XL was stopped and labetalol was started. His BP's were low and he was symptomatic. His Labetalol was stopped due to side effects.  He presents today stating he continues to have some chest pain mostly left precordial pain/left epigastric pain.  He denies any associated radiation to neck, arm, back, jaw.  Denies any nausea, or vomiting.  He does state he sometimes breaks out into a cold sweat.  His most recent stress test in October 2021 was considered low risk.  He denies any palpitations or arrhythmias, orthostatic symptoms, CVA or TIA-like symptoms, PND, orthopnea.  He denies any sleep apnea-like symptoms.  Blood pressure has improved some since medication changes.  BP today is 134/90.  He does state he has some shortness of breath when performing exertional activities.  He denies any bleeding issues, claudication-like symptoms, DVT or PE-like symptoms, or lower extremity edema.   Did not have echocardiogram done. Has GI appt 07/07/2021 Past Medical History:  Diagnosis Date   Anxiety    Depression    Diabetes  mellitus (HCC)    type 2   Hypertension    Insomnia    Kidney stone    Narcolepsy    TIA (transient ischemic attack)     Past Surgical History:  Procedure Laterality Date   NO PAST SURGERIES      Current Outpatient Medications  Medication Sig Dispense Refill   ALPRAZolam (XANAX) 0.5 MG tablet Take 0.5 mg by mouth 2 (two) times daily as needed.     amLODipine (NORVASC) 10 MG tablet Take 1 tablet by mouth every morning.     aspirin 81 MG EC tablet Take 81 mg by mouth daily. Swallow whole.     chlorthalidone (HYGROTON) 25 MG tablet Take 1 tablet (25 mg total) by mouth daily. 90 tablet 1   cyclobenzaprine (FLEXERIL) 5 MG tablet Take 5 mg by mouth 3 (three) times daily as needed for muscle spasms.     ibuprofen (ADVIL,MOTRIN) 200 MG tablet Take 400 mg by mouth every 6 (six) hours as needed for headache.     Ibuprofen-diphenhydrAMINE HCl 200-25 MG CAPS Take 1 capsule by mouth daily as needed (sleep).     metFORMIN (GLUCOPHAGE) 500 MG tablet Take 500 mg by mouth 2 (two) times daily with a meal.     methylphenidate (RITALIN) 20 MG tablet Take 20 mg by mouth 2 (two) times daily.      omeprazole (PRILOSEC) 20 MG capsule Take 1 capsule by mouth daily as needed.     sildenafil (VIAGRA) 100 MG tablet Take 100 mg by mouth daily as needed.     TRESIBA  FLEXTOUCH 200 UNIT/ML FlexTouch Pen Inject 24 Units into the skin daily at 6 (six) AM.     valsartan (DIOVAN) 320 MG tablet Take 1 tablet (320 mg total) by mouth daily. 90 tablet 1   No current facility-administered medications for this visit.   Allergies:  Demerol [meperidine]   Social History: The patient  reports that he has never smoked. His smokeless tobacco use includes snuff. He reports current alcohol use. He reports that he does not use drugs.   Family History: The patient's family history includes CAD in his father; Diabetes in his father; Hypertension in his mother.   ROS:  Please see the history of present illness. Otherwise, complete  review of systems is positive for none.  All other systems are reviewed and negative.   Physical Exam: VS:  There were no vitals taken for this visit., BMI There is no height or weight on file to calculate BMI.  Wt Readings from Last 3 Encounters:  03/20/21 224 lb (101.6 kg)  01/16/21 226 lb 9.6 oz (102.8 kg)  07/27/20 219 lb (99.3 kg)    General: Patient appears comfortable at rest. Neck: Supple, no elevated JVP or carotid bruits, no thyromegaly. Lungs: Clear to auscultation, nonlabored breathing at rest. Cardiac: Regular rate and rhythm, no S3 or significant systolic murmur, no pericardial rub. Extremities: No pitting edema, distal pulses 2+. Skin: Warm and dry. Musculoskeletal: No kyphosis. Neuropsychiatric: Alert and oriented x3, affect grossly appropriate.  ECG:    Recent Labwork: 07/28/2020: ALT 27; AST 17; Hemoglobin 15.5; Platelets 204 01/25/2021: BUN 14; Creat 0.88; Magnesium 1.9; Potassium 3.7; Sodium 138; TSH 5.19  No results found for: CHOL, TRIG, HDL, CHOLHDL, VLDL, LDLCALC, LDLDIRECT  Other Studies Reviewed Today:    Diagnostic Studies 07/2020 nuclear stress [facility] 1. No reversible ischemia or infarction.   2. Normal left ventricular wall motion.   3. Left ventricular ejection fraction 56%   4. Non invasive risk stratification*: Low     Assessment and Plan:  1. Uncontrolled hypertension   2. SOB (shortness of breath)   3. Epigastric pain   4. Chest pain, unspecified type     1. Uncontrolled hypertension Blood pressure has improved some since recent medication changes.  Blood pressure today 134/90.  Advised him the goal of blood pressure is 130/80 consistently.  Advised him to continue to monitor his blood pressures.  He states he gets it checked at work and usually running in this range recently.  Continue amlodipine 10 mg daily.  Chlorthalidone 25 mg daily.  Valsartan 320 mg p.o. daily.  2. SOB (shortness of breath) Voices complaints of DOE/SOB when  performing more than usual ADLs.  Please get an echocardiogram to assess LV function, diastolic function, valvular function.  3. Epigastric pain Voices issues with GERD and epigastric pain.  States he has never seen a gastroenterologist in the past.  Please refer to [facility] for evaluation.  4.  Chest pain. Voices complaints of chest pain.  Describes it as left precordial without radiation to neck, arm, back, jaw.  Denies any nausea or vomiting.  He states sometimes he can break out into his cold sweat.  He had a low risk stress test October 2021 at [facility].  He states he cannot take nitroglycerin because it drops his blood pressure significantly.   Medication Adjustments/Labs and Tests Ordered: Current medicines are reviewed at length with the patient today.  Concerns regarding medicines are outlined above.   Disposition: Follow-up with Dr. [name] or  APP 6 to 8 weeks.  Signed, Rennis Harding, NP 05/07/2021 8:15 PM    [facility] at [facility] [address] [place], [place], [place] [address] Phone: [phone]; Fax: [phone]

## 2021-05-08 ENCOUNTER — Ambulatory Visit: Payer: 59 | Admitting: Family Medicine

## 2021-05-08 DIAGNOSIS — I10 Essential (primary) hypertension: Secondary | ICD-10-CM

## 2021-05-08 DIAGNOSIS — R0602 Shortness of breath: Secondary | ICD-10-CM

## 2021-05-08 DIAGNOSIS — R1013 Epigastric pain: Secondary | ICD-10-CM

## 2021-05-08 DIAGNOSIS — R079 Chest pain, unspecified: Secondary | ICD-10-CM

## 2021-05-09 ENCOUNTER — Other Ambulatory Visit: Payer: Self-pay | Admitting: *Deleted

## 2021-05-09 DIAGNOSIS — R0600 Dyspnea, unspecified: Secondary | ICD-10-CM

## 2021-06-08 ENCOUNTER — Other Ambulatory Visit: Payer: 59

## 2021-06-15 ENCOUNTER — Ambulatory Visit: Payer: 59 | Admitting: Family Medicine

## 2021-07-06 ENCOUNTER — Other Ambulatory Visit: Payer: 59

## 2021-07-07 ENCOUNTER — Encounter: Payer: Self-pay | Admitting: Gastroenterology

## 2021-07-07 ENCOUNTER — Ambulatory Visit (INDEPENDENT_AMBULATORY_CARE_PROVIDER_SITE_OTHER): Payer: 59 | Admitting: Gastroenterology

## 2021-07-07 ENCOUNTER — Other Ambulatory Visit: Payer: Self-pay

## 2021-07-07 ENCOUNTER — Telehealth: Payer: Self-pay

## 2021-07-07 VITALS — BP 158/96 | HR 72 | Temp 97.1°F | Ht 74.0 in | Wt 224.4 lb

## 2021-07-07 DIAGNOSIS — R197 Diarrhea, unspecified: Secondary | ICD-10-CM | POA: Insufficient documentation

## 2021-07-07 DIAGNOSIS — K219 Gastro-esophageal reflux disease without esophagitis: Secondary | ICD-10-CM

## 2021-07-07 DIAGNOSIS — R1013 Epigastric pain: Secondary | ICD-10-CM | POA: Diagnosis not present

## 2021-07-07 NOTE — H&P (View-Only) (Signed)
Primary Care Physician:  [name], MD Referring provider: Rennis Harding, NP Primary Gastroenterologist:  Hennie Duos. Marletta Lor, DO   Chief Complaint  Patient presents with   Abdominal Pain    Comes/goes, epigastric, abd swelling   constipation/diarrhea    Alternates, no blood in stool    HPI:  Gary Wyatt is a 50 y.o. male here at the request of Rennis Harding, NP with cardiology, for further evaluation of epigastric pain.  History of low risk stress test October 2021.  One year history intermittent epigastric pain and bloating. Not necessarily related to strenuous activity. Seems to be worse with meals. No n/v. H/o heartburn, takes omeprazole daily as needed, several days per week. Has been on gerd medication for over five years. No dysphagia since on PPI used to have significant solid food dysphagia especially to meats. BMs alternate between diarrhea and constipation.  Predominance of loose stools.  Noted symptoms for several months now.  No changes in medications except for recent increase in his insulin.  No melena, brbpr. BM twice per day. No prior EGD/colonoscopy.    Current Outpatient Medications  Medication Sig Dispense Refill   ALPRAZolam (XANAX) 0.5 MG tablet Take 0.5 mg by mouth 2 (two) times daily as needed.     amLODipine (NORVASC) 10 MG tablet Take 1 tablet by mouth every morning.     aspirin 81 MG EC tablet Take 81 mg by mouth daily. Swallow whole.     chlorthalidone (HYGROTON) 25 MG tablet Take 1 tablet (25 mg total) by mouth daily. 90 tablet 1   cyclobenzaprine (FLEXERIL) 5 MG tablet Take 5 mg by mouth 3 (three) times daily as needed for muscle spasms.     ibuprofen (ADVIL,MOTRIN) 200 MG tablet Take 400 mg by mouth every 6 (six) hours as needed for headache.     Ibuprofen-diphenhydrAMINE HCl 200-25 MG CAPS Take 1 capsule by mouth daily as needed (sleep).     metFORMIN (GLUCOPHAGE) 500 MG tablet Take 500 mg by mouth 2 (two) times daily with a meal.     methylphenidate  (RITALIN) 20 MG tablet Take 20 mg by mouth 2 (two) times daily.      omeprazole (PRILOSEC) 20 MG capsule Take 1 capsule by mouth daily as needed.     sildenafil (VIAGRA) 100 MG tablet Take 100 mg by mouth daily as needed.     TRESIBA FLEXTOUCH 200 UNIT/ML FlexTouch Pen Inject 24 Units into the skin daily at 6 (six) AM.     valsartan (DIOVAN) 320 MG tablet Take 1 tablet (320 mg total) by mouth daily. 90 tablet 1   No current facility-administered medications for this visit.    Allergies as of 07/07/2021 - Review Complete 07/07/2021  Allergen Reaction Noted   Demerol [meperidine] Itching and Other (See Comments) 03/25/2012    Past Medical History:  Diagnosis Date   Anxiety    Depression    Diabetes mellitus (HCC)    type 2   Hypertension    Insomnia    Kidney stone    Narcolepsy    TIA (transient ischemic attack)     Past Surgical History:  Procedure Laterality Date   NO PAST SURGERIES      Family History  Problem Relation Age of Onset   Hypertension Mother    Diabetes Father    CAD Father    Prostate cancer Father    Colon cancer Neg Hx     Social History   Socioeconomic History  Marital status: Married    Spouse name: Not on file   Number of children: Not on file   Years of education: Not on file   Highest education level: Not on file  Occupational History   Not on file  Tobacco Use   Smoking status: Never   Smokeless tobacco: Current    Types: Snuff  Substance and Sexual Activity   Alcohol use: Yes    Comment: occas   Drug use: No   Sexual activity: Not on file  Other Topics Concern   Not on file  Social History Narrative   Not on file   Social Determinants of Health   Financial Resource Strain: Not on file  Food Insecurity: Not on file  Transportation Needs: Not on file  Physical Activity: Not on file  Stress: Not on file  Social Connections: Not on file  Intimate Partner Violence: Not on file      ROS:  General: Negative for anorexia,  weight loss, fever, chills, fatigue, weakness. Eyes: Negative for vision changes.  ENT: Negative for hoarseness, difficulty swallowing , nasal congestion. CV: Negative for chest pain, angina, palpitations, dyspnea on exertion, peripheral edema.  Respiratory: Negative for dyspnea at rest, dyspnea on exertion, cough, sputum, wheezing.  GI: See history of present illness. GU:  Negative for dysuria, hematuria, urinary incontinence, urinary frequency, nocturnal urination.  MS: Negative for joint pain, low back pain.  Derm: Negative for rash or itching.  Neuro: Negative for weakness, abnormal sensation, seizure, frequent headaches, memory loss, confusion.  Psych: Negative for anxiety, depression, suicidal ideation, hallucinations.  Endo: Negative for unusual weight change.  Heme: Negative for bruising or bleeding. Allergy: Negative for rash or hives.    Physical Examination:  BP (!) 158/96   Pulse 72   Temp (!) 97.1 F (36.2 C)   Ht 6\' 2"  (1.88 m)   Wt 224 lb 6.4 oz (101.8 kg)   BMI 28.81 kg/m    General: Well-nourished, well-developed in no acute distress.  Head: Normocephalic, atraumatic.   Eyes: Conjunctiva pink, no icterus. Mouth: masked Neck: Supple without thyromegaly, masses, or lymphadenopathy.  Lungs: Clear to auscultation bilaterally.  Heart: Regular rate and rhythm, no murmurs rubs or gallops.  Abdomen: Bowel sounds are normal,  nondistended, no hepatosplenomegaly or masses, no abdominal bruits or    hernia , no rebound or guarding.  Mild diffuse abdominal tenderness, increased tenderness in the epigastric region.  Tenderness located over golf ball size soft lesion at insulin injection site.  Patient reports injections always given in the same location.  Diastases recti. Rectal: not performed Extremities: No lower extremity edema. No clubbing or deformities.  Neuro: Alert and oriented x 4 , grossly normal neurologically.  Skin: Warm and dry, no rash or jaundice.   Psych:  Alert and cooperative, normal mood and affect.  Labs: Lab Results  Component Value Date   CREATININE 0.88 01/25/2021   BUN 14 01/25/2021   NA 138 01/25/2021   K 3.7 01/25/2021   CL 103 01/25/2021   CO2 25 01/25/2021   Lab Results  Component Value Date   ALT 27 07/28/2020   AST 17 07/28/2020   ALKPHOS 43 07/28/2020   BILITOT 0.6 07/28/2020   Lab Results  Component Value Date   WBC 7.0 07/28/2020   HGB 15.5 07/28/2020   HCT 47.0 07/28/2020   MCV 88.7 07/28/2020   PLT 204 07/28/2020   Lab Results  Component Value Date   TSH 5.19 (H) 01/25/2021  Imaging Studies: No results found.   Assessment:  50 year old male with 1 year history of intermittent cramping epigastric pain, abdominal bloating associated with change in bowel habits with loose stools.    Abdominal pain: Worse after meals.  Feels bloated after he eats.  Located in the upper abdomen and associated with bloating.  He takes aspirin 81 mg daily, ibuprofen as needed for headache but takes combination ibuprofen/Benadryl as a sleep aid on a regular basis.  Chronically on omeprazole.  At risk for gastritis, complicated GERD, peptic ulcer disease.  Cannot exclude hiatal hernia or gastroparesis.  Recommend further evaluation via endoscopy.  Chronic GERD: Has been on PPI for greater than 5 years.  Typical heartburn well controlled.  Previously before starting medication, he had significant solid food dysphagia resulting in near food impactions.  The symptoms improved after getting his reflux under control.  Recommend endoscopy to screen for Barrett's esophagus.  May or may not need esophageal dilation based on findings.  Change in bowel habits: Predominance of loose stools 1-2 times daily.  Symptoms occurring for several months, does not recall any specific medication changes around that time.  Possibly food related or side effect of medication.  Cannot exclude diabetic enteropathy.  We will screen for celiac.  Doubt IBD  or infectious etiology.  Discussed at length the need for colonoscopy to evaluate his change in bowel habits as well as he is never had colon cancer screening.  At this time he is not interested.  We will screen for celiac disease.  Plan: CBC, TTG IgA, serum IgA. EGD with possible esophageal dilation with propofol with Dr. Marletta Lor.  ASA 3.  I have discussed the risks, alternatives, benefits with regards to but not limited to the risk of reaction to medication, bleeding, infection, perforation and the patient is agreeable to proceed. Written consent to be obtained.

## 2021-07-07 NOTE — Patient Instructions (Signed)
Please have your labs done at [facility] at least two days before your endoscopy. Upper endoscopy as scheduled. See separate instructions.  We recommend a colonoscopy due to your age and change in bowels. Please let me know if you decide to pursue.

## 2021-07-07 NOTE — Telephone Encounter (Signed)
Pt requested work note for EGD/-/+DIL scheduled for 07/25/21. He works night shift and goes into work at 12:00am. He's aware he may resume normal activities the following day after procedure. He requested work note to say he may return to work 07/27/21 because his work schedule is a day ahead.  PA for EGD/DIL submitted via [facility] website. PA# [id number], valid 07/25/21-10/23/21.

## 2021-07-07 NOTE — Progress Notes (Signed)
Primary Care Physician:  Vyas, Dhruv B, MD Referring provider: Andrew Quinn, NP Primary Gastroenterologist:  Charles K. Carver, DO   Chief Complaint  Patient presents with   Abdominal Pain    Comes/goes, epigastric, abd swelling   constipation/diarrhea    Alternates, no blood in stool    HPI:  Gary Wyatt is a 50 y.o. male here at the request of Andrew Quinn, NP with cardiology, for further evaluation of epigastric pain.  History of low risk stress test October 2021.  One year history intermittent epigastric pain and bloating. Not necessarily related to strenuous activity. Seems to be worse with meals. No n/v. H/o heartburn, takes omeprazole daily as needed, several days per week. Has been on gerd medication for over five years. No dysphagia since on PPI used to have significant solid food dysphagia especially to meats. BMs alternate between diarrhea and constipation.  Predominance of loose stools.  Noted symptoms for several months now.  No changes in medications except for recent increase in his insulin.  No melena, brbpr. BM twice per day. No prior EGD/colonoscopy.    Current Outpatient Medications  Medication Sig Dispense Refill   ALPRAZolam (XANAX) 0.5 MG tablet Take 0.5 mg by mouth 2 (two) times daily as needed.     amLODipine (NORVASC) 10 MG tablet Take 1 tablet by mouth every morning.     aspirin 81 MG EC tablet Take 81 mg by mouth daily. Swallow whole.     chlorthalidone (HYGROTON) 25 MG tablet Take 1 tablet (25 mg total) by mouth daily. 90 tablet 1   cyclobenzaprine (FLEXERIL) 5 MG tablet Take 5 mg by mouth 3 (three) times daily as needed for muscle spasms.     ibuprofen (ADVIL,MOTRIN) 200 MG tablet Take 400 mg by mouth every 6 (six) hours as needed for headache.     Ibuprofen-diphenhydrAMINE HCl 200-25 MG CAPS Take 1 capsule by mouth daily as needed (sleep).     metFORMIN (GLUCOPHAGE) 500 MG tablet Take 500 mg by mouth 2 (two) times daily with a meal.     methylphenidate  (RITALIN) 20 MG tablet Take 20 mg by mouth 2 (two) times daily.      omeprazole (PRILOSEC) 20 MG capsule Take 1 capsule by mouth daily as needed.     sildenafil (VIAGRA) 100 MG tablet Take 100 mg by mouth daily as needed.     TRESIBA FLEXTOUCH 200 UNIT/ML FlexTouch Pen Inject 24 Units into the skin daily at 6 (six) AM.     valsartan (DIOVAN) 320 MG tablet Take 1 tablet (320 mg total) by mouth daily. 90 tablet 1   No current facility-administered medications for this visit.    Allergies as of 07/07/2021 - Review Complete 07/07/2021  Allergen Reaction Noted   Demerol [meperidine] Itching and Other (See Comments) 03/25/2012    Past Medical History:  Diagnosis Date   Anxiety    Depression    Diabetes mellitus (HCC)    type 2   Hypertension    Insomnia    Kidney stone    Narcolepsy    TIA (transient ischemic attack)     Past Surgical History:  Procedure Laterality Date   NO PAST SURGERIES      Family History  Problem Relation Age of Onset   Hypertension Mother    Diabetes Father    CAD Father    Prostate cancer Father    Colon cancer Neg Hx     Social History   Socioeconomic History     Marital status: Married    Spouse name: Not on file   Number of children: Not on file   Years of education: Not on file   Highest education level: Not on file  Occupational History   Not on file  Tobacco Use   Smoking status: Never   Smokeless tobacco: Current    Types: Snuff  Substance and Sexual Activity   Alcohol use: Yes    Comment: occas   Drug use: No   Sexual activity: Not on file  Other Topics Concern   Not on file  Social History Narrative   Not on file   Social Determinants of Health   Financial Resource Strain: Not on file  Food Insecurity: Not on file  Transportation Needs: Not on file  Physical Activity: Not on file  Stress: Not on file  Social Connections: Not on file  Intimate Partner Violence: Not on file      ROS:  General: Negative for anorexia,  weight loss, fever, chills, fatigue, weakness. Eyes: Negative for vision changes.  ENT: Negative for hoarseness, difficulty swallowing , nasal congestion. CV: Negative for chest pain, angina, palpitations, dyspnea on exertion, peripheral edema.  Respiratory: Negative for dyspnea at rest, dyspnea on exertion, cough, sputum, wheezing.  GI: See history of present illness. GU:  Negative for dysuria, hematuria, urinary incontinence, urinary frequency, nocturnal urination.  MS: Negative for joint pain, low back pain.  Derm: Negative for rash or itching.  Neuro: Negative for weakness, abnormal sensation, seizure, frequent headaches, memory loss, confusion.  Psych: Negative for anxiety, depression, suicidal ideation, hallucinations.  Endo: Negative for unusual weight change.  Heme: Negative for bruising or bleeding. Allergy: Negative for rash or hives.    Physical Examination:  BP (!) 158/96   Pulse 72   Temp (!) 97.1 F (36.2 C)   Ht 6\' 2"  (1.88 m)   Wt 224 lb 6.4 oz (101.8 kg)   BMI 28.81 kg/m    General: Well-nourished, well-developed in no acute distress.  Head: Normocephalic, atraumatic.   Eyes: Conjunctiva pink, no icterus. Mouth: masked Neck: Supple without thyromegaly, masses, or lymphadenopathy.  Lungs: Clear to auscultation bilaterally.  Heart: Regular rate and rhythm, no murmurs rubs or gallops.  Abdomen: Bowel sounds are normal,  nondistended, no hepatosplenomegaly or masses, no abdominal bruits or    hernia , no rebound or guarding.  Mild diffuse abdominal tenderness, increased tenderness in the epigastric region.  Tenderness located over golf ball size soft lesion at insulin injection site.  Patient reports injections always given in the same location.  Diastases recti. Rectal: not performed Extremities: No lower extremity edema. No clubbing or deformities.  Neuro: Alert and oriented x 4 , grossly normal neurologically.  Skin: Warm and dry, no rash or jaundice.   Psych:  Alert and cooperative, normal mood and affect.  Labs: Lab Results  Component Value Date   CREATININE 0.88 01/25/2021   BUN 14 01/25/2021   NA 138 01/25/2021   K 3.7 01/25/2021   CL 103 01/25/2021   CO2 25 01/25/2021   Lab Results  Component Value Date   ALT 27 07/28/2020   AST 17 07/28/2020   ALKPHOS 43 07/28/2020   BILITOT 0.6 07/28/2020   Lab Results  Component Value Date   WBC 7.0 07/28/2020   HGB 15.5 07/28/2020   HCT 47.0 07/28/2020   MCV 88.7 07/28/2020   PLT 204 07/28/2020   Lab Results  Component Value Date   TSH 5.19 (H) 01/25/2021  Imaging Studies: No results found.   Assessment:  50-year-old male with 1 year history of intermittent cramping epigastric pain, abdominal bloating associated with change in bowel habits with loose stools.    Abdominal pain: Worse after meals.  Feels bloated after he eats.  Located in the upper abdomen and associated with bloating.  He takes aspirin 81 mg daily, ibuprofen as needed for headache but takes combination ibuprofen/Benadryl as a sleep aid on a regular basis.  Chronically on omeprazole.  At risk for gastritis, complicated GERD, peptic ulcer disease.  Cannot exclude hiatal hernia or gastroparesis.  Recommend further evaluation via endoscopy.  Chronic GERD: Has been on PPI for greater than 5 years.  Typical heartburn well controlled.  Previously before starting medication, he had significant solid food dysphagia resulting in near food impactions.  The symptoms improved after getting his reflux under control.  Recommend endoscopy to screen for Barrett's esophagus.  May or may not need esophageal dilation based on findings.  Change in bowel habits: Predominance of loose stools 1-2 times daily.  Symptoms occurring for several months, does not recall any specific medication changes around that time.  Possibly food related or side effect of medication.  Cannot exclude diabetic enteropathy.  We will screen for celiac.  Doubt IBD  or infectious etiology.  Discussed at length the need for colonoscopy to evaluate his change in bowel habits as well as he is never had colon cancer screening.  At this time he is not interested.  We will screen for celiac disease.  Plan: CBC, TTG IgA, serum IgA. EGD with possible esophageal dilation with propofol with Dr. Carver.  ASA 3.  I have discussed the risks, alternatives, benefits with regards to but not limited to the risk of reaction to medication, bleeding, infection, perforation and the patient is agreeable to proceed. Written consent to be obtained.    

## 2021-07-10 ENCOUNTER — Telehealth: Payer: Self-pay | Admitting: Internal Medicine

## 2021-07-10 NOTE — Telephone Encounter (Signed)
Returned the pt's call and advised the pt according to his AVS he is to do his bloodwork 2 days prior to procedure.

## 2021-07-10 NOTE — Telephone Encounter (Signed)
Pt wanted to know when his labs were due. [phone]

## 2021-07-11 ENCOUNTER — Ambulatory Visit: Payer: 59 | Admitting: Family Medicine

## 2021-07-17 ENCOUNTER — Ambulatory Visit: Payer: 59 | Admitting: Family Medicine

## 2021-07-20 ENCOUNTER — Other Ambulatory Visit: Payer: 59

## 2021-07-21 LAB — CBC WITH DIFFERENTIAL/PLATELET
Absolute Monocytes: 511 cells/uL (ref 200–950)
Basophils Absolute: 42 cells/uL (ref 0–200)
Basophils Relative: 0.6 %
Eosinophils Absolute: 168 cells/uL (ref 15–500)
Eosinophils Relative: 2.4 %
HCT: 47.3 % (ref 38.5–50.0)
Hemoglobin: 16.1 g/dL (ref 13.2–17.1)
Lymphs Abs: 1519 cells/uL (ref 850–3900)
MCH: 28.4 pg (ref 27.0–33.0)
MCHC: 34 g/dL (ref 32.0–36.0)
MCV: 83.6 fL (ref 80.0–100.0)
MPV: 11.2 fL (ref 7.5–12.5)
Monocytes Relative: 7.3 %
Neutro Abs: 4760 cells/uL (ref 1500–7800)
Neutrophils Relative %: 68 %
Platelets: 246 10*3/uL (ref 140–400)
RBC: 5.66 10*6/uL (ref 4.20–5.80)
RDW: 13.5 % (ref 11.0–15.0)
Total Lymphocyte: 21.7 %
WBC: 7 10*3/uL (ref 3.8–10.8)

## 2021-07-21 LAB — IGA: Immunoglobulin A: 247 mg/dL (ref 47–310)

## 2021-07-21 LAB — TISSUE TRANSGLUTAMINASE, IGA: (tTG) Ab, IgA: <1 U/mL

## 2021-07-24 NOTE — Progress Notes (Deleted)
Cardiology Office Note  Date: 07/24/2021   ID: Gary Wyatt, DOB 01-Jun-1971, MRN [id number]  PCP:  [name], MD  Cardiologist:  [name], MD Electrophysiologist:  None   Chief Complaint: Follow up  History of Present Illness: Gary Wyatt is a 50 y.o. male with a history of hypertension, DM2, TIA.  Last seen by Dr. [name] on 01/16/2021 with difficulty controlling blood pressures.  Valsartan was started at 320 mg daily with chlorthalidone 25 mg daily.  Labs were ordered including basic metabolic panel, magnesium, renin/aldosterone ratio.  Dr. [name] noted consider changing Toprol to more effective beta-blocker like Coreg or labetalol.  Could consider Aldactone for resistant hypertension.  He mentioned if ongoing issues with blood pressure would consider sleep study, renal artery ultrasound.  He was given info on DASH diet.  Discussed low-sodium diet.   His Toprol XL was stopped and labetalol was started. His BP's were low and he was symptomatic. His Labetalol was stopped due to side effects.  He presents today stating he continues to have some chest pain mostly left precordial pain/left epigastric pain.  He denies any associated radiation to neck, arm, back, jaw.  Denies any nausea, or vomiting.  He does state he sometimes breaks out into a cold sweat.  His most recent stress test in October 2021 was considered low risk.  He denies any palpitations or arrhythmias, orthostatic symptoms, CVA or TIA-like symptoms, PND, orthopnea.  He denies any sleep apnea-like symptoms.  Blood pressure has improved some since medication changes.  BP today is 134/90.  He does state he has some shortness of breath when performing exertional activities.  He denies any bleeding issues, claudication-like symptoms, DVT or PE-like symptoms, or lower extremity edema.  Echo was ordered but not done  Past Medical History:  Diagnosis Date   Anxiety    Depression    Diabetes mellitus (HCC)    type 2    Hypertension    Insomnia    Kidney stone    Narcolepsy    TIA (transient ischemic attack)     Past Surgical History:  Procedure Laterality Date   NO PAST SURGERIES      Current Outpatient Medications  Medication Sig Dispense Refill   ALPRAZolam (XANAX) 0.5 MG tablet Take 0.5 mg by mouth 2 (two) times daily as needed for anxiety.     amLODipine (NORVASC) 10 MG tablet Take 5 mg by mouth every morning.     aspirin 81 MG EC tablet Take 81 mg by mouth daily. Swallow whole.     chlorthalidone (HYGROTON) 25 MG tablet Take 1 tablet (25 mg total) by mouth daily. 90 tablet 1   ibuprofen (ADVIL,MOTRIN) 200 MG tablet Take 400 mg by mouth every 6 (six) hours as needed for headache.     Ibuprofen-diphenhydrAMINE HCl 200-25 MG CAPS Take 1 capsule by mouth daily as needed (sleep).     methylphenidate (RITALIN) 20 MG tablet Take 20 mg by mouth 2 (two) times daily.      omeprazole (PRILOSEC) 20 MG capsule Take 1 capsule by mouth daily as needed (heartburn).     sildenafil (VIAGRA) 100 MG tablet Take 100 mg by mouth daily as needed for erectile dysfunction.     TRESIBA FLEXTOUCH 200 UNIT/ML FlexTouch Pen Inject 28 Units into the skin daily at 6 (six) AM.     valsartan (DIOVAN) 320 MG tablet Take 1 tablet (320 mg total) by mouth daily. 90 tablet 1   No current  facility-administered medications for this visit.   Allergies:  Demerol [meperidine]   Social History: The patient  reports that he has never smoked. His smokeless tobacco use includes snuff. He reports current alcohol use. He reports that he does not use drugs.   Family History: The patient's family history includes CAD in his father; Diabetes in his father; Hypertension in his mother; Prostate cancer in his father.   ROS:  Please see the history of present illness. Otherwise, complete review of systems is positive for none.  All other systems are reviewed and negative.   Physical Exam: VS:  There were no vitals taken for this visit., BMI  There is no height or weight on file to calculate BMI.  Wt Readings from Last 3 Encounters:  07/07/21 224 lb 6.4 oz (101.8 kg)  03/20/21 224 lb (101.6 kg)  01/16/21 226 lb 9.6 oz (102.8 kg)    General: Patient appears comfortable at rest. Neck: Supple, no elevated JVP or carotid bruits, no thyromegaly. Lungs: Clear to auscultation, nonlabored breathing at rest. Cardiac: Regular rate and rhythm, no S3 or significant systolic murmur, no pericardial rub. Extremities: No pitting edema, distal pulses 2+. Skin: Warm and dry. Musculoskeletal: No kyphosis. Neuropsychiatric: Alert and oriented x3, affect grossly appropriate.  ECG:    Recent Labwork: 07/28/2020: ALT 27; AST 17 01/25/2021: BUN 14; Creat 0.88; Magnesium 1.9; Potassium 3.7; Sodium 138; TSH 5.19 07/20/2021: Hemoglobin 16.1; Platelets 246  No results found for: CHOL, TRIG, HDL, CHOLHDL, VLDL, LDLCALC, LDLDIRECT  Other Studies Reviewed Today:    Diagnostic Studies 07/2020 nuclear stress [facility] 1. No reversible ischemia or infarction.   2. Normal left ventricular wall motion.   3. Left ventricular ejection fraction 56%   4. Non invasive risk stratification*: Low     Assessment and Plan:  1. Uncontrolled hypertension   2. SOB (shortness of breath)   3. Epigastric pain   4. Chest pain, unspecified type     1. Uncontrolled hypertension Blood pressure has improved some since recent medication changes.  Blood pressure today 134/90.  Advised him the goal of blood pressure is 130/80 consistently.  Advised him to continue to monitor his blood pressures.  He states he gets it checked at work and usually running in this range recently.  Continue amlodipine 10 mg daily.  Chlorthalidone 25 mg daily.  Valsartan 320 mg p.o. daily.  2. SOB (shortness of breath) Voices complaints of DOE/SOB when performing more than usual ADLs.  Please get an echocardiogram to assess LV function, diastolic function, valvular function.  3. Epigastric  pain Voices issues with GERD and epigastric pain.  States he has never seen a gastroenterologist in the past.  Please refer to [facility] for evaluation.  4.  Chest pain. Voices complaints of chest pain.  Describes it as left precordial without radiation to neck, arm, back, jaw.  Denies any nausea or vomiting.  He states sometimes he can break out into his cold sweat.  He had a low risk stress test October 2021 at [facility].  He states he cannot take nitroglycerin because it drops his blood pressure significantly.   Medication Adjustments/Labs and Tests Ordered: Current medicines are reviewed at length with the patient today.  Concerns regarding medicines are outlined above.   Disposition: Follow-up with Dr. [name] or APP 6 to 8 weeks.  Signed, Rennis Harding, NP 07/24/2021 8:09 AM    [facility] [address] [place], [place], [place] [address] Phone: [phone];  Fax: [phone]

## 2021-07-25 ENCOUNTER — Ambulatory Visit (HOSPITAL_COMMUNITY)
Admission: RE | Admit: 2021-07-25 | Discharge: 2021-07-25 | Disposition: A | Discharge status: Home / Self Care | Payer: 59 | Attending: Internal Medicine | Admitting: Internal Medicine

## 2021-07-25 ENCOUNTER — Ambulatory Visit (HOSPITAL_COMMUNITY): Payer: 59 | Admitting: Anesthesiology

## 2021-07-25 ENCOUNTER — Encounter (HOSPITAL_COMMUNITY): Payer: Self-pay

## 2021-07-25 ENCOUNTER — Other Ambulatory Visit: Payer: Self-pay

## 2021-07-25 ENCOUNTER — Ambulatory Visit: Payer: 59 | Admitting: Family Medicine

## 2021-07-25 ENCOUNTER — Encounter (HOSPITAL_COMMUNITY)
Admission: RE | Disposition: A | Discharge status: Home / Self Care | Payer: Self-pay | Source: Home / Self Care | Attending: Internal Medicine

## 2021-07-25 DIAGNOSIS — Z7984 Long term (current) use of oral hypoglycemic drugs: Secondary | ICD-10-CM | POA: Diagnosis not present

## 2021-07-25 DIAGNOSIS — R079 Chest pain, unspecified: Secondary | ICD-10-CM

## 2021-07-25 DIAGNOSIS — Z79899 Other long term (current) drug therapy: Secondary | ICD-10-CM | POA: Insufficient documentation

## 2021-07-25 DIAGNOSIS — Z833 Family history of diabetes mellitus: Secondary | ICD-10-CM | POA: Insufficient documentation

## 2021-07-25 DIAGNOSIS — K2100 Gastro-esophageal reflux disease with esophagitis, without bleeding: Secondary | ICD-10-CM | POA: Diagnosis not present

## 2021-07-25 DIAGNOSIS — R197 Diarrhea, unspecified: Secondary | ICD-10-CM | POA: Insufficient documentation

## 2021-07-25 DIAGNOSIS — Z8249 Family history of ischemic heart disease and other diseases of the circulatory system: Secondary | ICD-10-CM | POA: Diagnosis not present

## 2021-07-25 DIAGNOSIS — K2970 Gastritis, unspecified, without bleeding: Secondary | ICD-10-CM | POA: Insufficient documentation

## 2021-07-25 DIAGNOSIS — Z791 Long term (current) use of non-steroidal anti-inflammatories (NSAID): Secondary | ICD-10-CM | POA: Insufficient documentation

## 2021-07-25 DIAGNOSIS — F17290 Nicotine dependence, other tobacco product, uncomplicated: Secondary | ICD-10-CM | POA: Insufficient documentation

## 2021-07-25 DIAGNOSIS — E119 Type 2 diabetes mellitus without complications: Secondary | ICD-10-CM | POA: Diagnosis not present

## 2021-07-25 DIAGNOSIS — R1310 Dysphagia, unspecified: Secondary | ICD-10-CM | POA: Insufficient documentation

## 2021-07-25 DIAGNOSIS — Z794 Long term (current) use of insulin: Secondary | ICD-10-CM | POA: Diagnosis not present

## 2021-07-25 DIAGNOSIS — R0602 Shortness of breath: Secondary | ICD-10-CM

## 2021-07-25 DIAGNOSIS — K5900 Constipation, unspecified: Secondary | ICD-10-CM | POA: Insufficient documentation

## 2021-07-25 DIAGNOSIS — K319 Disease of stomach and duodenum, unspecified: Secondary | ICD-10-CM | POA: Diagnosis not present

## 2021-07-25 DIAGNOSIS — Z7982 Long term (current) use of aspirin: Secondary | ICD-10-CM | POA: Diagnosis not present

## 2021-07-25 DIAGNOSIS — R1013 Epigastric pain: Secondary | ICD-10-CM | POA: Diagnosis present

## 2021-07-25 DIAGNOSIS — Z8042 Family history of malignant neoplasm of prostate: Secondary | ICD-10-CM | POA: Diagnosis not present

## 2021-07-25 DIAGNOSIS — I10 Essential (primary) hypertension: Secondary | ICD-10-CM

## 2021-07-25 HISTORY — PX: BIOPSY: SHX5522

## 2021-07-25 HISTORY — PX: ESOPHAGOGASTRODUODENOSCOPY (EGD) WITH PROPOFOL: SHX5813

## 2021-07-25 LAB — GLUCOSE, CAPILLARY: Glucose-Capillary: 134 mg/dL — ABNORMAL HIGH (ref 70–99)

## 2021-07-25 SURGERY — ESOPHAGOGASTRODUODENOSCOPY (EGD) WITH PROPOFOL
Anesthesia: General

## 2021-07-25 MED ORDER — OMEPRAZOLE 20 MG PO CPDR: 20.0000 mg | DELAYED_RELEASE_CAPSULE | Freq: Two times a day (BID) | ORAL | 5 refills | Status: DC

## 2021-07-25 MED ORDER — LIDOCAINE HCL (CARDIAC) PF 100 MG/5ML IV SOSY
PREFILLED_SYRINGE | INTRAVENOUS | Status: DC | PRN
  Administered 2021-07-25: 50 mg via INTRAVENOUS

## 2021-07-25 MED ORDER — LACTATED RINGERS IV SOLN: INTRAVENOUS | Status: DC

## 2021-07-25 MED ORDER — PROPOFOL 10 MG/ML IV BOLUS
INTRAVENOUS | Status: DC | PRN
  Administered 2021-07-25 (×2): 50 mg via INTRAVENOUS
  Administered 2021-07-25: 100 mg via INTRAVENOUS

## 2021-07-25 NOTE — Discharge Instructions (Addendum)
EGD Discharge instructions Please read the instructions outlined below and refer to this sheet in the next few weeks. These discharge instructions provide you with general information on caring for yourself after you leave the hospital. Your doctor may also give you specific instructions. While your treatment has been planned according to the most current medical practices available, unavoidable complications occasionally occur. If you have any problems or questions after discharge, please call your doctor. ACTIVITY You may resume your regular activity but move at a slower pace for the next 24 hours.  Take frequent rest periods for the next 24 hours.  Walking will help expel (get rid of) the air and reduce the bloated feeling in your abdomen.  No driving for 24 hours (because of the anesthesia (medicine) used during the test).  You may shower.  Do not sign any important legal documents or operate any machinery for 24 hours (because of the anesthesia used during the test).  NUTRITION Drink plenty of fluids.  You may resume your normal diet.  Begin with a light meal and progress to your normal diet.  Avoid alcoholic beverages for 24 hours or as instructed by your caregiver.  MEDICATIONS You may resume your normal medications unless your caregiver tells you otherwise.  WHAT YOU CAN EXPECT TODAY You may experience abdominal discomfort such as a feeling of fullness or "gas" pains.  FOLLOW-UP Your doctor will discuss the results of your test with you.  SEEK IMMEDIATE MEDICAL ATTENTION IF ANY OF THE FOLLOWING OCCUR: Excessive nausea (feeling sick to your stomach) and/or vomiting.  Severe abdominal pain and distention (swelling).  Trouble swallowing.  Temperature over 101 F (37.8 C).  Rectal bleeding or vomiting of blood.    Your EGD revealed mild amount inflammation in your stomach.  I took biopsies of this to rule out infection with a bacteria called H. pylori.  Await pathology results, my  office will contact you. I also took biopsies of your esophagus to rule out Barrett's as well as eosinophilic esophagitis.  I am going to increase your omeprazole to 20 mg twice daily for the next 12 weeks.  At that time you can go down to once daily.  Follow-up with GI in 3 to 4 months.   It was nice meeting you today.  I hope you have a great rest of your week!  Hennie Duos. Marletta Lor, D.O. Gastroenterology and Hepatology [facility]

## 2021-07-25 NOTE — Anesthesia Postprocedure Evaluation (Signed)
Anesthesia Post Note  Patient: Gary Wyatt  Procedure(s) Performed: ESOPHAGOGASTRODUODENOSCOPY (EGD) WITH PROPOFOL BIOPSY  Patient location during evaluation: Phase II Anesthesia Type: General Level of consciousness: awake Pain management: pain level controlled Vital Signs Assessment: post-procedure vital signs reviewed and stable Respiratory status: spontaneous breathing and respiratory function stable Cardiovascular status: blood pressure returned to baseline and stable Postop Assessment: no headache and no apparent nausea or vomiting Anesthetic complications: no Comments: Late entry   No notable events documented.   Last Vitals:  Vitals:   07/25/21 0800 07/25/21 0804  BP: 91/64 98/66  Pulse: 77 68  Resp: 20 19  Temp:    SpO2: 97% 98%    Last Pain:  Vitals:   07/25/21 0800  TempSrc:   PainSc: 0-No pain                 Windell Norfolk

## 2021-07-25 NOTE — Op Note (Signed)
[facility] Patient Name: Gary Wyatt Procedure Date: 07/25/2021 7:12 AM MRN: [id number] Date of Birth: 08-12-1971 Attending MD: Elon Alas. Abbey Chatters DO CSN: [id number] Age: 50 Admit Type: Outpatient Procedure:                Upper GI endoscopy Indications:              Epigastric abdominal pain, Heartburn Providers:                Elon Alas. Abbey Chatters, DO, Jessica Boudreaux, Casimer Bilis, Technician Referring MD:              Medicines:                See the Anesthesia note for documentation of the                            administered medications Complications:            No immediate complications. Estimated Blood Loss:     Estimated blood loss was minimal. Procedure:                Pre-Anesthesia Assessment:                           - The anesthesia plan was to use monitored                            anesthesia care (MAC).                           After obtaining informed consent, the endoscope was                            passed under direct vision. Throughout the                            procedure, the patient's blood pressure, pulse, and                            oxygen saturations were monitored continuously. The                            [id number]-H190 ([id number]) scope was introduced through the                            mouth, and advanced to the second part of duodenum.                            The upper GI endoscopy was accomplished without                            difficulty. The patient tolerated the procedure                            well. Scope In: 7:45:26  AM Scope Out: 7:51:41 AM Total Procedure Duration: 0 hours 6 minutes 15 seconds  Findings:      Mucosal changes including ringed esophagus were found in the middle       third of the esophagus. Esophageal findings were graded using the       Eosinophilic Esophagitis Endoscopic Reference Score (EoE-EREFS) as:       Edema Grade 0 Normal (distinct vascular markings),  Rings Grade 1 Mild       (subtle circumferential ridges seen on esophageal distension), Exudates       Grade 0 None (no white lesions seen), Furrows Grade 0 None (no vertical       lines seen) and Stricture none (no stricture found). Biopsies were taken       with a cold forceps for histology.      The Z-line was irregular and was found 40 cm from the incisors. Biopsies       were taken with a cold forceps for histology.      Localized mild inflammation characterized by linear erosions was found       in the gastric antrum. Biopsies were taken with a cold forceps for       Helicobacter pylori testing.      The duodenal bulb, first portion of the duodenum and second portion of       the duodenum were normal. Impression:               - Esophageal mucosal changes suspicious for                            eosinophilic esophagitis. Biopsied.                           - Z-line irregular, 40 cm from the incisors.                            Biopsied.                           - Gastritis. Biopsied.                           - Normal duodenal bulb, first portion of the                            duodenum and second portion of the duodenum. Moderate Sedation:      Per Anesthesia Care Recommendation:           - Patient has a contact number available for                            emergencies. The signs and symptoms of potential                            delayed complications were discussed with the                            patient. Return to normal activities tomorrow.  Written discharge instructions were provided to the                            patient.                           - Resume previous diet.                           - Continue present medications.                           - Await pathology results.                           - Use Prilosec (omeprazole) 20 mg PO BID.                           - Return to GI clinic in 4 months. Procedure Code(s):         --- Professional ---                           [id number], Esophagogastroduodenoscopy, flexible,                            transoral; with biopsy, single or multiple Diagnosis Code(s):        --- Professional ---                           K22.8, Other specified diseases of esophagus                           K29.70, Gastritis, unspecified, without bleeding                           R10.13, Epigastric pain                           R12, Heartburn CPT copyright 2019 [employer] Association. All rights reserved. The codes documented in this report are preliminary and upon coder review may  be revised to meet current compliance requirements. Elon Alas. Abbey Chatters, DO [place] Abbey Chatters, DO 07/25/2021 7:55:45 AM This report has been signed electronically. Number of Addenda: 0

## 2021-07-25 NOTE — Transfer of Care (Signed)
Immediate Anesthesia Transfer of Care Note  Patient: Gary Wyatt  Procedure(s) Performed: ESOPHAGOGASTRODUODENOSCOPY (EGD) WITH PROPOFOL BIOPSY  Patient Location: Endoscopy Unit  Anesthesia Type:General  Level of Consciousness: drowsy  Airway & Oxygen Therapy: Patient Spontanous Breathing  Post-op Assessment: Report given to RN and Post -op Vital signs reviewed and stable  Post vital signs: Reviewed and stable  Last Vitals:  Vitals Value Taken Time  BP    Temp    Pulse    Resp    SpO2      Last Pain:  Vitals:   07/25/21 0741  TempSrc:   PainSc: 0-No pain         Complications: No notable events documented.

## 2021-07-25 NOTE — Anesthesia Preprocedure Evaluation (Signed)
Anesthesia Evaluation  Patient identified by MRN, date of birth, ID band Patient awake    Reviewed: Allergy & Precautions, H&P , NPO status , Patient's Chart, lab work & pertinent test results, reviewed documented beta blocker date and time   Airway Mallampati: II  TM Distance: >3 FB Neck ROM: full    Dental no notable dental hx.    Pulmonary neg pulmonary ROS,    Pulmonary exam normal breath sounds clear to auscultation       Cardiovascular Exercise Tolerance: Good hypertension, negative cardio ROS   Rhythm:regular Rate:Normal     Neuro/Psych  Headaches, PSYCHIATRIC DISORDERS Anxiety Depression TIA   GI/Hepatic Neg liver ROS, GERD  Medicated,  Endo/Other  negative endocrine ROSdiabetes  Renal/GU Renal disease  negative genitourinary   Musculoskeletal   Abdominal   Peds  Hematology negative hematology ROS (+)   Anesthesia Other Findings   Reproductive/Obstetrics negative OB ROS                             Anesthesia Physical Anesthesia Plan  ASA: 2  Anesthesia Plan: General   Post-op Pain Management:    Induction:   PONV Risk Score and Plan: Propofol infusion  Airway Management Planned:   Additional Equipment:   Intra-op Plan:   Post-operative Plan:   Informed Consent: I have reviewed the patients History and Physical, chart, labs and discussed the procedure including the risks, benefits and alternatives for the proposed anesthesia with the patient or authorized representative who has indicated his/her understanding and acceptance.     Dental Advisory Given  Plan Discussed with: CRNA  Anesthesia Plan Comments:         Anesthesia Quick Evaluation

## 2021-07-25 NOTE — Interval H&P Note (Signed)
History and Physical Interval Note:  07/25/2021 7:36 AM  Gary Wyatt  has presented today for surgery, with the diagnosis of dysphagia, GERD.  The various methods of treatment have been discussed with the patient and family. After consideration of risks, benefits and other options for treatment, the patient has consented to  Procedure(s) with comments: ESOPHAGOGASTRODUODENOSCOPY (EGD) WITH PROPOFOL (N/A) - 7:30am BALLOON DILATION (N/A) as a surgical intervention.  The patient's history has been reviewed, patient examined, no change in status, stable for surgery.  I have reviewed the patient's chart and labs.  Questions were answered to the patient's satisfaction.     Lanelle Bal

## 2021-07-26 ENCOUNTER — Other Ambulatory Visit: Payer: Self-pay | Admitting: Cardiology

## 2021-07-26 LAB — SURGICAL PATHOLOGY

## 2021-08-01 ENCOUNTER — Encounter (HOSPITAL_COMMUNITY): Payer: Self-pay | Admitting: Internal Medicine

## 2021-08-21 NOTE — Progress Notes (Addendum)
Cardiology Office Note  Date: 08/22/2021   ID: Gary Wyatt, DOB 1971-07-09, MRN [id number]  PCP:  [name], MD  Cardiologist:  [name], MD Electrophysiologist:  None   Chief Complaint: 8-week he presented last visit stating he continued follow up  History of Present Illness: Gary Wyatt is a 50 y.o. male with a history of hypertension, DM2, TIA.  Last seen by Dr. [name] on 01/16/2021 with difficulty controlling blood pressures.  Valsartan was started at 320 mg daily with chlorthalidone 25 mg daily.  Labs were ordered including basic metabolic panel, magnesium, renin/aldosterone ratio.  Dr. [name] noted consider changing Toprol to more effective beta-blocker like Coreg or labetalol.  Could consider Aldactone for resistant hypertension.  He mentioned if ongoing issues with blood pressure would consider sleep study, renal artery ultrasound.  He was given info on DASH diet.  Discussed low-sodium diet.   His Toprol XL was stopped and labetalol was started. His BP's were low and he was symptomatic. His Labetalol was stopped due to side effects.  He presented last visit stating he continued to have some chest pain mostly left precordial pain/left epigastric pain.  He denied any associated radiation to neck, arm, back, jaw.  Denied nausea, or vomiting.  He stated he would sometimes break out into a cold sweat.  His most recent stress test in October 2021 was considered low risk.  He denies any palpitations or arrhythmias, orthostatic symptoms, CVA or TIA-like symptoms, PND, orthopnea.  Denies any history of sleep apnea-like symptoms.  Blood pressure had improved some since medication changes.  BP was 134/90.  He stated he was having some shortness of breath when performing exertional activities.  He denied any bleeding issues, claudication-like symptoms, DVT or PE-like symptoms, or lower extremity edema.  He is here for follow-up.  It appears he was only taking amlodipine 5 mg per  nursing staff versus 10 mg which he should have been taking.  We will up the dose of amlodipine to 10 mg as agreed upon previously.  Patient states he missed his echocardiogram because he had to have an upper endoscopy for his gastric pain.  Still complains of some chest pain at work with sweating when this occurs.  He had low risk stress test in October 2021.  He is otherwise without complaint.  Denies any palpitations, orthostatic symptoms, CVA or TIA-like symptoms, bleeding issues, PND, orthopnea, claudication-like symptoms, DVT or PE-like symptoms, or lower extremity edema.   Past Medical History:  Diagnosis Date   Anxiety    Depression    Diabetes mellitus (HCC)    type 2   Hypertension    Insomnia    Kidney stone    Narcolepsy    TIA (transient ischemic attack)     Past Surgical History:  Procedure Laterality Date   BIOPSY  07/25/2021   Procedure: BIOPSY;  Surgeon: Lanelle Bal, DO;  [facility];  Service: Endoscopy;;   ESOPHAGOGASTRODUODENOSCOPY (EGD) WITH PROPOFOL N/A 07/25/2021   Procedure: ESOPHAGOGASTRODUODENOSCOPY (EGD) WITH PROPOFOL;  Surgeon: Lanelle Bal, DO;  Location[facility];  Service: Endoscopy;  Laterality: N/A;  7:30am   NO PAST SURGERIES      Current Outpatient Medications  Medication Sig Dispense Refill   ALPRAZolam (XANAX) 0.5 MG tablet Take 0.5 mg by mouth 2 (two) times daily as needed for anxiety.     amLODipine (NORVASC) 5 MG tablet Take 5 mg by mouth every morning.  aspirin 81 MG EC tablet Take 81 mg by mouth daily. Swallow whole.     chlorthalidone (HYGROTON) 25 MG tablet TAKE ONE TABLET ONCE DAILY 60 tablet 0   ibuprofen (ADVIL,MOTRIN) 200 MG tablet Take 400 mg by mouth every 6 (six) hours as needed for headache.     Ibuprofen-diphenhydrAMINE HCl 200-25 MG CAPS Take 1 capsule by mouth daily as needed (sleep).     methylphenidate (RITALIN) 20 MG tablet Take 20 mg by mouth 2 (two) times daily.      omeprazole (PRILOSEC) 20  MG capsule Take 1 capsule (20 mg total) by mouth 2 (two) times daily before a meal. 60 capsule 5   sildenafil (VIAGRA) 100 MG tablet Take 100 mg by mouth daily as needed for erectile dysfunction.     TRESIBA FLEXTOUCH 200 UNIT/ML FlexTouch Pen Inject 28 Units into the skin daily at 6 (six) AM.     valsartan (DIOVAN) 320 MG tablet TAKE ONE TABLET ONCE DAILY 60 tablet 0   No current facility-administered medications for this visit.   Allergies:  Demerol [meperidine]   Social History: The patient  reports that he has never smoked. He has never been exposed to tobacco smoke. His smokeless tobacco use includes snuff. He reports current alcohol use. He reports that he does not use drugs.   Family History: The patient's family history includes CAD in his father; Diabetes in his father; Hypertension in his mother; Prostate cancer in his father.   ROS:  Please see the history of present illness. Otherwise, complete review of systems is positive for none.  All other systems are reviewed and negative.   Physical Exam: VS:  BP (!) 150/94   Pulse 82   Ht 6\' 2"  (1.88 m)   Wt 222 lb 3.2 oz (100.8 kg)   SpO2 98%   BMI 28.53 kg/m , BMI Body mass index is 28.53 kg/m.  Wt Readings from Last 3 Encounters:  08/22/21 222 lb 3.2 oz (100.8 kg)  07/07/21 224 lb 6.4 oz (101.8 kg)  03/20/21 224 lb (101.6 kg)    General: Patient appears comfortable at rest. Neck: Supple, no elevated JVP or carotid bruits, no thyromegaly. Lungs: Clear to auscultation, nonlabored breathing at rest. Cardiac: Regular rate and rhythm, no S3 or significant systolic murmur, no pericardial rub. Extremities: No pitting edema, distal pulses 2+. Skin: Warm and dry. Musculoskeletal: No kyphosis. Neuropsychiatric: Alert and oriented x3, affect grossly appropriate.  ECG: 08/22/2021 EKG normal sinus rhythm rate of 80  Recent Labwork: 01/25/2021: BUN 14; Creat 0.88; Magnesium 1.9; Potassium 3.7; Sodium 138; TSH 5.19 07/20/2021:  Hemoglobin 16.1; Platelets 246  No results found for: CHOL, TRIG, HDL, CHOLHDL, VLDL, LDLCALC, LDLDIRECT  Other Studies Reviewed Today:  Upper endoscopy 07/25/2021 - Esophageal mucosal changes suspicious for eosinophilic esophagitis. Biopsied. - Z-line irregular, 40 cm from the incisors. Biopsied. - Gastritis. Biopsied. - Normal duodenal bulb, first portion of the duodenum and second portion of the duodenum.    Diagnostic Studies 07/2020 nuclear stress [facility] 1. No reversible ischemia or infarction.   2. Normal left ventricular wall motion.   3. Left ventricular ejection fraction 56%   4. Non invasive risk stratification*: Low     Assessment and Plan:  1. Uncontrolled hypertension   2. SOB (shortness of breath)   3. Epigastric pain   4. Chest pain, unspecified type      1. Uncontrolled hypertension Nursing staff informs me today he was not taking 10 mg of amlodipine as previously directed.  Advised we will increase amlodipine to 10 mg.  Continue chlorthalidone 25 mg daily, continue valsartan 320 mg daily.  We discussed if this does not control blood pressures we may refer him to the hypertension clinic.  2. SOB (shortness of breath) At last visit he voiced complaints of DOE/SOB when performing more than usual ADLs.  We ordered an echocardiogram but this had to be postponed because he had an upper EGD for epigastric pain.  We will reschedule echocardiogram.  3. Epigastric pain Recent EGD on 07/25/2021 showed esophageal mucosal changes suspicious for eosinophilic esophagitis, biopsied, Z-line irregular, 40 cm from incisors biopsied, gastritis biopsied, normal duodenal bulb, first portion of the duodenum and second portion of the duodenum.  4.  Chest pain. Continues to complain of chest pain at work when performing exertional activity with associated diaphoresis.  Describes it as left precordial without radiation to neck, arm, back, jaw.  Denies any nausea or vomiting.  He states  sometimes he can break out into a cold sweat.  He states most of the time when the chest pain occurs his blood pressure is also elevated.  He had a low risk stress test October 2021 at [facility].  He states he cannot take nitroglycerin because it drops his blood pressure significantly.  Continue aspirin 81 mg daily.  We are increasing amlodipine to 10 mg for blood pressure.  Hopefully this will help with possible anginal symptoms although he did have a low risk stress test late last year.  Medication Adjustments/Labs and Tests Ordered: Current medicines are reviewed at length with the patient today.  Concerns regarding medicines are outlined above.   Disposition: Follow-up with Dr. [name] or APP 2 months  Signed, Rennis Harding, NP 08/22/2021 9:13 AM    [facility] [address] [place], [place], [place] [address] Phone: [phone]; Fax: [phone]

## 2021-08-22 ENCOUNTER — Ambulatory Visit (INDEPENDENT_AMBULATORY_CARE_PROVIDER_SITE_OTHER): Payer: 59 | Admitting: Family Medicine

## 2021-08-22 ENCOUNTER — Encounter: Payer: Self-pay | Admitting: Family Medicine

## 2021-08-22 ENCOUNTER — Telehealth: Payer: Self-pay | Admitting: Family Medicine

## 2021-08-22 VITALS — BP 150/94 | HR 82 | Ht 74.0 in | Wt 222.2 lb

## 2021-08-22 DIAGNOSIS — R1013 Epigastric pain: Secondary | ICD-10-CM

## 2021-08-22 DIAGNOSIS — I10 Essential (primary) hypertension: Secondary | ICD-10-CM

## 2021-08-22 DIAGNOSIS — Z0279 Encounter for issue of other medical certificate: Secondary | ICD-10-CM

## 2021-08-22 DIAGNOSIS — R0602 Shortness of breath: Secondary | ICD-10-CM | POA: Diagnosis not present

## 2021-08-22 DIAGNOSIS — R079 Chest pain, unspecified: Secondary | ICD-10-CM

## 2021-08-22 MED ORDER — AMLODIPINE BESYLATE 10 MG PO TABS: 10.0000 mg | ORAL_TABLET | Freq: Every day | ORAL | 1 refills | Status: DC

## 2021-08-22 NOTE — Patient Instructions (Addendum)
Medication Instructions:  Your physician has recommended you make the following change in your medication:  Increase amlodipine to 10 mg daily Continue other medications the same  Labwork: none  Testing/Procedures: Your physician has requested that you have an echocardiogram. Echocardiography is a painless test that uses sound waves to create images of your heart. It provides your doctor with information about the size and shape of your heart and how well your heart's chambers and valves are working. This procedure takes approximately one hour. There are no restrictions for this procedure.  Follow-Up: Your physician recommends that you schedule a follow-up appointment in: 2 months  Any Other Special Instructions Will Be Listed Below (If Applicable).  If you need a refill on your cardiac medications before your next appointment, please call your pharmacy.

## 2021-08-22 NOTE — Telephone Encounter (Signed)
FMLA forms dropped off and put in tray for clinical staff   [name] 08/22/2021

## 2021-08-28 NOTE — Telephone Encounter (Signed)
Completed FMLA forms received/scanned and sent to [employer] for charge posting.

## 2021-09-25 ENCOUNTER — Telehealth: Payer: Self-pay | Admitting: Cardiology

## 2021-09-25 NOTE — Telephone Encounter (Signed)
Patient informed and verbalized understanding of plan. 

## 2021-09-25 NOTE — Telephone Encounter (Signed)
Reports having intermittent light chest pain at work last night rated 6/10 Reports active chest pain 4/10 Reports burning on right side of neck and stiffness on right side of back Denies SOB or dizziness Does not have nitroglycerin Medications reviewed Advised to contact his PCP regarding BP and burning and stiffness in neck and back Advised that we are awaiting echocardiogram Advised to continue monitoring symptoms and if they get worse, to go to the ED for an evaluation Verbalized understanding of plan

## 2021-09-25 NOTE — Telephone Encounter (Signed)
If severe progressing symptoms needs ER evaluation, we can do a virtual visit Jan 2nd 2pm to discuss symptoms and if any repeat testing is needed to look for blockages such as a coronary CTA or cath.  Dominga Ferry MD

## 2021-09-25 NOTE — Telephone Encounter (Signed)
°  Patient Consent for Virtual Visit        Gary Wyatt has provided verbal consent on 09/25/2021 for a virtual visit (video or telephone).   CONSENT FOR VIRTUAL VISIT FOR:  Gary Wyatt  By participating in this virtual visit I agree to the following:  I hereby voluntarily request, consent and authorize [facility] and its employed or contracted physicians, physician assistants, nurse practitioners or other licensed health care professionals (the Practitioner), to provide me with telemedicine health care services (the Services") as deemed necessary by the treating Practitioner. I acknowledge and consent to receive the Services by the Practitioner via telemedicine. I understand that the telemedicine visit will involve communicating with the Practitioner through live audiovisual communication technology and the disclosure of certain medical information by electronic transmission. I acknowledge that I have been given the opportunity to request an in-person assessment or other available alternative prior to the telemedicine visit and am voluntarily participating in the telemedicine visit.  I understand that I have the right to withhold or withdraw my consent to the use of telemedicine in the course of my care at any time, without affecting my right to future care or treatment, and that the Practitioner or I may terminate the telemedicine visit at any time. I understand that I have the right to inspect all information obtained and/or recorded in the course of the telemedicine visit and may receive copies of available information for a reasonable fee.  I understand that some of the potential risks of receiving the Services via telemedicine include:  Delay or interruption in medical evaluation due to technological equipment failure or disruption; Information transmitted may not be sufficient (e.g. poor resolution of images) to allow for appropriate medical decision making by the Practitioner; and/or   In rare instances, security protocols could fail, causing a breach of personal health information.  Furthermore, I acknowledge that it is my responsibility to provide information about my medical history, conditions and care that is complete and accurate to the best of my ability. I acknowledge that Practitioner's advice, recommendations, and/or decision may be based on factors not within their control, such as incomplete or inaccurate data provided by me or distortions of diagnostic images or specimens that may result from electronic transmissions. I understand that the practice of medicine is not an exact science and that Practitioner makes no warranties or guarantees regarding treatment outcomes. I acknowledge that a copy of this consent can be made available to me via my patient portal [facility] MyChart), or I can request a printed copy by calling the office of [facility].    I understand that my insurance will be billed for this visit.   I have read or had this consent read to me. I understand the contents of this consent, which adequately explains the benefits and risks of the Services being provided via telemedicine.  I have been provided ample opportunity to ask questions regarding this consent and the Services and have had my questions answered to my satisfaction. I give my informed consent for the services to be provided through the use of telemedicine in my medical care

## 2021-09-25 NOTE — Telephone Encounter (Signed)
Pt c/o of Chest Pain: STAT if CP now or developed within 24 hours  1. Are you having CP right now?  YES   2. Are you experiencing any other symptoms (ex. SOB, nausea, vomiting, sweating)? NAUSEA HANDS STATED SWEATING LAST NIGHT  3. How long have you been experiencing CP? LAST NIGHT AT 8PM  4. Is your CP continuous or coming and going? COMES AND GOES  5. Have you taken Nitroglycerin? NO   PT STARTED HAVING CP WHILE GETTING READY FOR WORK LAST NIGHT AT 8 PM, HE WENT TO THE NURSE AND SHE TOOK HIS BP 134/84 WHEN THE PT ARRIVED HOME THIS MORNING HIS BP IS CURRENTLY 157/101  ?

## 2021-09-26 ENCOUNTER — Other Ambulatory Visit: Payer: Self-pay | Admitting: Cardiology

## 2021-10-10 ENCOUNTER — Ambulatory Visit: Payer: 59 | Admitting: Cardiology

## 2021-10-10 ENCOUNTER — Other Ambulatory Visit: Payer: Self-pay

## 2021-10-10 NOTE — Progress Notes (Unsigned)
Virtual Visit via Telephone Note   This visit type was conducted due to national recommendations for restrictions regarding the COVID-19 Pandemic (e.g. social distancing) in an effort to limit this patient's exposure and mitigate transmission in our community.  Due to his co-morbid illnesses, this patient is at least at moderate risk for complications without adequate follow up.  This format is felt to be most appropriate for this patient at this time.  The patient did not have access to video technology/had technical difficulties with video requiring transitioning to audio format only (telephone).  All issues noted in this document were discussed and addressed.  No physical exam could be performed with this format.  Please refer to the patient's chart for his  consent to telehealth for [facility].    Date:  10/10/2021   ID:  Gary Wyatt, DOB 1971-01-24, MRN [id number] The patient was identified using 2 identifiers.  Patient Location: Home Provider Location: Office/Clinic   PCP:  Glenda Chroman, MD   [facility] Cardiologist:  Carlyle Dolly, MD     Evaluation Performed:  Follow-Up Visit  Chief Complaint:  Follow up  History of Present Illness:    Gary Wyatt is a 51 y.o. male with ***    1.Chest pain/SOB - seen by PA Leonides Sake 08/2021 in clinic with chest pain - exertional pain with associated diaphoresis - 07/2020 nuclear stress: no ischemia. Exericsed nearly 10 minutes -    2. Epigastric pain - Recent EGD on 07/25/2021 showed esophageal mucosal changes suspicious for eosinophilic esophagitis  1. HTN - compliant with meds - home bp's 160-180s/90s-110s     - no snoring, no witnessed apneic episodes, some chronic fatigue/hypersomnolence - no significant NSAID use - just occasional EtOH, once a month        The patient {does/does not:200015} have symptoms concerning for COVID-19 infection (fever, chills, cough, or new shortness of breath).     Past Medical History:  Diagnosis Date   Anxiety    Depression    Diabetes mellitus ([place])    type 2   Hypertension    Insomnia    Kidney stone    Narcolepsy    TIA (transient ischemic attack)    Past Surgical History:  Procedure Laterality Date   BIOPSY  07/25/2021   Procedure: BIOPSY;  Surgeon: Eloise Harman, DO;  [facility];  Service: Endoscopy;;   ESOPHAGOGASTRODUODENOSCOPY (EGD) WITH PROPOFOL N/A 07/25/2021   Procedure: ESOPHAGOGASTRODUODENOSCOPY (EGD) WITH PROPOFOL;  Surgeon: Eloise Harman, DO;  [facility];  Service: Endoscopy;  Laterality: N/A;  7:30am   NO PAST SURGERIES       No outpatient medications have been marked as taking for the 10/10/21 encounter (Appointment) with Arnoldo Lenis, MD.     Allergies:   Demerol [meperidine]   Social History   Tobacco Use   Smoking status: Never    Passive exposure: Never   Smokeless tobacco: Current    Types: Snuff  Vaping Use   Vaping Use: Never used  Substance Use Topics   Alcohol use: Yes    Comment: occas   Drug use: No     Family Hx: The patient's family history includes CAD in his father; Diabetes in his father; Hypertension in his mother; Prostate cancer in his father. There is no history of Colon cancer.  ROS:   Please see the history of present illness.    *** All other systems reviewed and are negative.  Prior CV studies:   The following studies were reviewed today:  ***  Labs/Other Tests and Data Reviewed:    EKG:  {EKG/Telemetry Strips Reviewed:[phone]}  Recent Labs: 01/25/2021: BUN 14; Creat 0.88; Magnesium 1.9; Potassium 3.7; Sodium 138; TSH 5.19 07/20/2021: Hemoglobin 16.1; Platelets 246   Recent Lipid Panel No results found for: CHOL, TRIG, HDL, CHOLHDL, LDLCALC, LDLDIRECT  Wt Readings from Last 3 Encounters:  08/22/21 222 lb 3.2 oz (100.8 kg)  07/07/21 224 lb 6.4 oz (101.8 kg)  03/20/21 224 lb (101.6 kg)     Risk Assessment/Calculations:    {Does this patient have ATRIAL FIBRILLATION?:[phone]}      Objective:    Vital Signs:  There were no vitals taken for this visit.   {[facility] Virtual Exam (Optional):[phone]::"VITAL SIGNS:  reviewed"}  ASSESSMENT & PLAN:    1. HTN - difficult to control bp's - stop diovan HCT. Start valsartan by itself 320 mg daily and chlorthlaidone 25mg  daily - in 2 weeks check bmet/mg/tsh/renin/aldo ratio - he will update [place] on bp's in 1 week. Could consider changing toprol to more bp effective beta blocker like coreg or labetalol, could consider aldactone for resistant HTN as well - if ongoing issues with bp control would consider sleep study, renal artery [place] - given info on DASH diet, discussed low sodium diet   - work note given as he reports cannot work unless bp <150/100, will take a few days for these changes to take effect, excuse given for Mon through Thurs this week.       {Are you ordering a CV Procedure (e.g. stress test, cath, DCCV, TEE, etc)?   Press F2        :[id number]    COVID-19 Education: The signs and symptoms of COVID-19 were discussed with the patient and how to seek care for testing (follow up with PCP or arrange E-visit).  ***The importance of social distancing was discussed today.  Time:   Today, I have spent *** minutes with the patient with telehealth technology discussing the above problems.     Medication Adjustments/Labs and Tests Ordered: Current medicines are reviewed at length with the patient today.  Concerns regarding medicines are outlined above.   Tests Ordered: No orders of the defined types were placed in this encounter.   Medication Changes: No orders of the defined types were placed in this encounter.   Follow Up:  {F/U [phone]} {follow up:15908}  Signed, Carlyle Dolly, MD  10/10/2021 11:55 AM    [facility]

## 2021-10-26 ENCOUNTER — Other Ambulatory Visit: Payer: 59

## 2021-11-02 ENCOUNTER — Ambulatory Visit: Payer: 59 | Admitting: Cardiology

## 2021-11-02 NOTE — Progress Notes (Incomplete)
Clinical Summary Mr. Kauffmann is a 51 y.o.male  1.Chest pain/SOB - seen by PA Vincenza Hews 08/2021 in clinic with chest pain - exertional pain with associated diaphoresis - 07/2020 nuclear stress [facility]: no ischemia. Exericsed nearly 10 minutes -      2. Epigastric pain - Recent EGD on 07/25/2021 showed esophageal mucosal changes suspicious for eosinophilic esophagitis   3. HTN - compliant with meds - home bp's 160-180s/90s-110s     - no snoring, no witnessed apneic episodes, some chronic fatigue/hypersomnolence - no significant NSAID use - just occasional EtOH, once a month       Past Medical History:  Diagnosis Date   Anxiety    Depression    Diabetes mellitus (HCC)    type 2   Hypertension    Insomnia    Kidney stone    Narcolepsy    TIA (transient ischemic attack)      Allergies  Allergen Reactions   Demerol [Meperidine] Itching and Other (See Comments)    irritation     Current Outpatient Medications  Medication Sig Dispense Refill   ALPRAZolam (XANAX) 0.5 MG tablet Take 0.5 mg by mouth 2 (two) times daily as needed for anxiety.     amLODipine (NORVASC) 10 MG tablet Take 1 tablet (10 mg total) by mouth daily. 90 tablet 1   aspirin 81 MG EC tablet Take 81 mg by mouth daily. Swallow whole.     chlorthalidone (HYGROTON) 25 MG tablet TAKE ONE TABLET ONCE DAILY 60 tablet 0   ibuprofen (ADVIL,MOTRIN) 200 MG tablet Take 400 mg by mouth every 6 (six) hours as needed for headache.     Ibuprofen-diphenhydrAMINE HCl 200-25 MG CAPS Take 1 capsule by mouth daily as needed (sleep).     methylphenidate (RITALIN) 20 MG tablet Take 20 mg by mouth 2 (two) times daily.      omeprazole (PRILOSEC) 20 MG capsule Take 1 capsule (20 mg total) by mouth 2 (two) times daily before a meal. 60 capsule 5   sildenafil (VIAGRA) 100 MG tablet Take 100 mg by mouth daily as needed for erectile dysfunction.     TRESIBA FLEXTOUCH 200 UNIT/ML FlexTouch Pen Inject 28 Units into the skin daily at 6  (six) AM.     valsartan (DIOVAN) 320 MG tablet TAKE ONE TABLET ONCE DAILY 60 tablet 3   No current facility-administered medications for this visit.     Past Surgical History:  Procedure Laterality Date   BIOPSY  07/25/2021   Procedure: BIOPSY;  Surgeon: Lanelle Bal, DO;  Location: [facility];  Service: Endoscopy;;   ESOPHAGOGASTRODUODENOSCOPY (EGD) WITH PROPOFOL N/A 07/25/2021   Procedure: ESOPHAGOGASTRODUODENOSCOPY (EGD) WITH PROPOFOL;  Surgeon: Lanelle Bal, DO;  Location: [facility];  Service: Endoscopy;  Laterality: N/A;  7:30am   NO PAST SURGERIES       Allergies  Allergen Reactions   Demerol [Meperidine] Itching and Other (See Comments)    irritation      Family History  Problem Relation Age of Onset   Hypertension Mother    Diabetes Father    CAD Father    Prostate cancer Father    Colon cancer Neg Hx      Social History Mr. Shock reports that he has never smoked. He has never been exposed to tobacco smoke. His smokeless tobacco use includes snuff. Mr. Carfagno reports current alcohol use.   Review of Systems CONSTITUTIONAL: No weight loss, fever, chills, weakness or fatigue.  HEENT: Eyes:  No visual loss, blurred vision, double vision or yellow sclerae.No hearing loss, sneezing, congestion, runny nose or sore throat.  SKIN: No rash or itching.  CARDIOVASCULAR:  RESPIRATORY: No shortness of breath, cough or sputum.  GASTROINTESTINAL: No anorexia, nausea, vomiting or diarrhea. No abdominal pain or blood.  GENITOURINARY: No burning on urination, no polyuria NEUROLOGICAL: No headache, dizziness, syncope, paralysis, ataxia, numbness or tingling in the extremities. No change in bowel or bladder control.  MUSCULOSKELETAL: No muscle, back pain, joint pain or stiffness.  LYMPHATICS: No enlarged nodes. No history of splenectomy.  PSYCHIATRIC: No history of depression or anxiety.  ENDOCRINOLOGIC: No reports of sweating, cold or heat intolerance. No  polyuria or polydipsia.  Marland Kitchen   Physical Examination There were no vitals filed for this visit. There were no vitals filed for this visit.  Gen: resting comfortably, no acute distress HEENT: no scleral icterus, pupils equal round and reactive, no palptable cervical adenopathy,  CV Resp: Clear to auscultation bilaterally GI: abdomen is soft, non-tender, non-distended, normal bowel sounds, no hepatosplenomegaly MSK: extremities are warm, no edema.  Skin: warm, no rash Neuro:  no focal deficits Psych: appropriate affect   Diagnostic Studies     Assessment and Plan   1. HTN - difficult to control bp's - stop diovan HCT. Start valsartan by itself 320 mg daily and chlorthlaidone 25mg  daily - in 2 weeks check bmet/mg/tsh/renin/aldo ratio - he will update on bp's in 1 week. Could consider changing toprol to more bp effective beta blocker like coreg or labetalol, could consider aldactone for resistant HTN as well - if ongoing issues with bp control would consider sleep study, renal artery [place] - given info on DASH diet, discussed low sodium diet   - work note given as he reports cannot work unless bp <150/100, will take a few days for these changes to take effect, excuse given for Mon through Thurs this week.      [place], M.D., F.A.C.C.

## 2021-11-04 ENCOUNTER — Other Ambulatory Visit: Payer: Self-pay

## 2021-11-04 ENCOUNTER — Encounter (HOSPITAL_COMMUNITY): Payer: Self-pay | Admitting: Emergency Medicine

## 2021-11-04 ENCOUNTER — Emergency Department (HOSPITAL_COMMUNITY): Payer: 59

## 2021-11-04 ENCOUNTER — Emergency Department (HOSPITAL_COMMUNITY)
Admission: EM | Admit: 2021-11-04 | Discharge: 2021-11-04 | Disposition: A | Discharge status: Home / Self Care | Payer: 59 | Attending: Emergency Medicine | Admitting: Emergency Medicine

## 2021-11-04 DIAGNOSIS — J40 Bronchitis, not specified as acute or chronic: Secondary | ICD-10-CM | POA: Diagnosis not present

## 2021-11-04 DIAGNOSIS — Z8616 Personal history of COVID-19: Secondary | ICD-10-CM | POA: Insufficient documentation

## 2021-11-04 DIAGNOSIS — Z794 Long term (current) use of insulin: Secondary | ICD-10-CM | POA: Insufficient documentation

## 2021-11-04 DIAGNOSIS — Z7982 Long term (current) use of aspirin: Secondary | ICD-10-CM | POA: Insufficient documentation

## 2021-11-04 DIAGNOSIS — J189 Pneumonia, unspecified organism: Secondary | ICD-10-CM

## 2021-11-04 DIAGNOSIS — Z79899 Other long term (current) drug therapy: Secondary | ICD-10-CM | POA: Diagnosis not present

## 2021-11-04 DIAGNOSIS — E1165 Type 2 diabetes mellitus with hyperglycemia: Secondary | ICD-10-CM | POA: Insufficient documentation

## 2021-11-04 DIAGNOSIS — I10 Essential (primary) hypertension: Secondary | ICD-10-CM | POA: Insufficient documentation

## 2021-11-04 DIAGNOSIS — R739 Hyperglycemia, unspecified: Secondary | ICD-10-CM

## 2021-11-04 LAB — BLOOD GAS, VENOUS
Acid-Base Excess: 2.5 mmol/L — ABNORMAL HIGH (ref 0.0–2.0)
Bicarbonate: 26.4 mmol/L (ref 20.0–28.0)
Drawn by: 442
FIO2: 21
O2 Saturation: 89.9 %
Patient temperature: 36.7
pCO2, Ven: 39.7 mmHg — ABNORMAL LOW (ref 44.0–60.0)
pH, Ven: 7.437 — ABNORMAL HIGH (ref 7.250–7.430)
pO2, Ven: 57 mmHg — ABNORMAL HIGH (ref 32.0–45.0)

## 2021-11-04 LAB — COMPREHENSIVE METABOLIC PANEL
ALT: 38 U/L (ref 0–44)
AST: 25 U/L (ref 15–41)
Albumin: 3.8 g/dL (ref 3.5–5.0)
Alkaline Phosphatase: 96 U/L (ref 38–126)
Anion gap: 10 (ref 5–15)
BUN: 24 mg/dL — ABNORMAL HIGH (ref 6–20)
CO2: 25 mmol/L (ref 22–32)
Calcium: 8.8 mg/dL — ABNORMAL LOW (ref 8.9–10.3)
Chloride: 95 mmol/L — ABNORMAL LOW (ref 98–111)
Creatinine, Ser: 1.19 mg/dL (ref 0.61–1.24)
GFR, Estimated: >60 mL/min (ref >60)
Glucose, Bld: 539 mg/dL (ref 70–99)
Potassium: 3.9 mmol/L (ref 3.5–5.1)
Sodium: 130 mmol/L — ABNORMAL LOW (ref 135–145)
Total Bilirubin: 0.6 mg/dL (ref 0.3–1.2)
Total Protein: 6.8 g/dL (ref 6.5–8.1)

## 2021-11-04 LAB — CBG MONITORING, ED
Glucose-Capillary: 551 mg/dL (ref 70–99)
Glucose-Capillary: 326 mg/dL — ABNORMAL HIGH (ref 70–99)

## 2021-11-04 LAB — CBC WITH DIFFERENTIAL/PLATELET
Abs Immature Granulocytes: 0.05 10*3/uL (ref 0.00–0.07)
Basophils Absolute: 0 10*3/uL (ref 0.0–0.1)
Basophils Relative: 0 %
Eosinophils Absolute: 0 10*3/uL (ref 0.0–0.5)
Eosinophils Relative: 0 %
HCT: 46.3 % (ref 39.0–52.0)
Hemoglobin: 16.3 g/dL (ref 13.0–17.0)
Immature Granulocytes: 1 %
Lymphocytes Relative: 10 %
Lymphs Abs: 1 10*3/uL (ref 0.7–4.0)
MCH: 29.1 pg (ref 26.0–34.0)
MCHC: 35.2 g/dL (ref 30.0–36.0)
MCV: 82.5 fL (ref 80.0–100.0)
Monocytes Absolute: 0.6 10*3/uL (ref 0.1–1.0)
Monocytes Relative: 6 %
Neutro Abs: 8.2 10*3/uL — ABNORMAL HIGH (ref 1.7–7.7)
Neutrophils Relative %: 83 %
Platelets: 255 10*3/uL (ref 150–400)
RBC: 5.61 MIL/uL (ref 4.22–5.81)
RDW: 12.7 % (ref 11.5–15.5)
WBC: 9.9 10*3/uL (ref 4.0–10.5)
nRBC: 0 % (ref 0.0–0.2)

## 2021-11-04 LAB — LIPASE, BLOOD: Lipase: 39 U/L (ref 11–51)

## 2021-11-04 LAB — BETA-HYDROXYBUTYRIC ACID: Beta-Hydroxybutyric Acid: 0.13 mmol/L (ref 0.05–0.27)

## 2021-11-04 MED ORDER — SODIUM CHLORIDE 0.9 % IV BOLUS
1000.0000 mL | Freq: Once | INTRAVENOUS | Status: AC
  Administered 2021-11-04: 1000 mL via INTRAVENOUS

## 2021-11-04 MED ORDER — INSULIN ASPART 100 UNIT/ML IJ SOLN
8.0000 [IU] | Freq: Once | INTRAMUSCULAR | Status: AC
  Administered 2021-11-04: 8 [IU] via SUBCUTANEOUS
  Filled 2021-11-04: qty 1

## 2021-11-04 MED ORDER — PROMETHAZINE-DM 6.25-15 MG/5ML PO SYRP: 5.0000 mL | ORAL_SOLUTION | Freq: Four times a day (QID) | ORAL | 0 refills | Status: DC | PRN

## 2021-11-04 NOTE — ED Provider Notes (Signed)
[facility] EMERGENCY DEPARTMENT Provider Note   CSN: [id number] Arrival date & time: 11/04/21  1656     History  Chief Complaint  Patient presents with   Hyperglycemia    Gary Wyatt is a 51 y.o. male.  Past medical history includes hypertension, diabetes type 2, TIAs. Presents the emergency department with complaints of worsening shortness of breath after having COVID-19 about 2 weeks ago.  He also states that he has had an increase in thirst, dry mouth, and increased urination.  He has some mild abdominal tenderness.  No nausea or vomiting and he does have history of diabetes and is on Trulicity.  He does not check his blood sugars at home, however he thinks he is normally pretty well controlled.  Patient denies any fevers.  HPI     Home Medications Prior to Admission medications   Medication Sig Start Date End Date Taking? Authorizing Provider  promethazine-dextromethorphan (PROMETHAZINE-DM) 6.25-15 MG/5ML syrup Take 5 mLs by mouth 4 (four) times daily as needed for cough. 11/04/21  Yes Charla Criscione, Finis Bud, PA-C  ALPRAZolam (XANAX) 0.5 MG tablet Take 0.5 mg by mouth 2 (two) times daily as needed for anxiety. 12/05/20   [provider]  amLODipine (NORVASC) 10 MG tablet Take 1 tablet (10 mg total) by mouth daily. 08/22/21   Netta Neat., NP  aspirin 81 MG EC tablet Take 81 mg by mouth daily. Swallow whole.    [provider]  chlorthalidone (HYGROTON) 25 MG tablet TAKE ONE TABLET ONCE DAILY 07/26/21   Antoine Poche, MD  ibuprofen (ADVIL,MOTRIN) 200 MG tablet Take 400 mg by mouth every 6 (six) hours as needed for headache.    [provider]  Ibuprofen-diphenhydrAMINE HCl 200-25 MG CAPS Take 1 capsule by mouth daily as needed (sleep).    [provider]  methylphenidate (RITALIN) 20 MG tablet Take 20 mg by mouth 2 (two) times daily.     [provider]  omeprazole (PRILOSEC) 20 MG capsule Take 1 capsule (20 mg total) by mouth 2  (two) times daily before a meal. 07/25/21 01/21/22  Lanelle Bal, DO  sildenafil (VIAGRA) 100 MG tablet Take 100 mg by mouth daily as needed for erectile dysfunction. 01/10/21   [provider]  TRESIBA FLEXTOUCH 200 UNIT/ML FlexTouch Pen Inject 28 Units into the skin daily at 6 (six) AM. 11/28/20   [provider]  valsartan (DIOVAN) 320 MG tablet TAKE ONE TABLET ONCE DAILY 09/26/21   Antoine Poche, MD      Allergies    Demerol [meperidine]    Review of Systems   Review of Systems  Respiratory:  Positive for shortness of breath.   Gastrointestinal:  Positive for abdominal pain.  Endocrine: Positive for polydipsia and polyuria.  All other systems reviewed and are negative.  Physical Exam Updated Vital Signs BP 130/90 (BP Location: Right Arm)    Pulse 70    Temp 98.1 F (36.7 C) (Oral)    Resp 17    Ht 6\' 2"  (1.88 m)    Wt 97.5 kg    SpO2 98%    BMI 27.60 kg/m  Physical Exam Vitals and nursing note reviewed.  Constitutional:      General: He is not in acute distress.    Appearance: Normal appearance. He is well-developed. He is not ill-appearing, toxic-appearing or diaphoretic.  HENT:     Head: Normocephalic and atraumatic.     Nose: No nasal deformity.  Mouth/Throat:     Lips: Pink. No lesions.     Mouth: Mucous membranes are moist.     Pharynx: Oropharynx is clear. No oropharyngeal exudate or posterior oropharyngeal erythema.  Eyes:     General: Gaze aligned appropriately. No scleral icterus.       Right eye: No discharge.        Left eye: No discharge.     Conjunctiva/sclera: Conjunctivae normal.     Right eye: Right conjunctiva is not injected. No exudate or hemorrhage.    Left eye: Left conjunctiva is not injected. No exudate or hemorrhage. Cardiovascular:     Rate and Rhythm: Normal rate and regular rhythm.     Pulses: Normal pulses.     Heart sounds: Normal heart sounds. No murmur heard.   No friction rub. No gallop.  Pulmonary:      Effort: Pulmonary effort is normal. No respiratory distress.     Breath sounds: Normal breath sounds. No stridor. No wheezing, rhonchi or rales.  Chest:     Chest wall: No tenderness.  Abdominal:     General: Abdomen is flat. There is no distension.     Palpations: Abdomen is soft.     Tenderness: There is abdominal tenderness. There is no guarding or rebound.     Comments: Generalized ttp  Musculoskeletal:     Right lower leg: No edema.     Left lower leg: No edema.  Skin:    General: Skin is warm and dry.     Coloration: Skin is not jaundiced.  Neurological:     Mental Status: He is alert and oriented to person, place, and time.  Psychiatric:        Mood and Affect: Mood normal.        Speech: Speech normal.        Behavior: Behavior normal. Behavior is cooperative.    ED Results / Procedures / Treatments   Labs (all labs ordered are listed, but only abnormal results are displayed) Labs Reviewed  COMPREHENSIVE METABOLIC PANEL - Abnormal; Notable for the following components:      Result Value   Sodium 130 (*)    Chloride 95 (*)    Glucose, Bld 539 (*)    BUN 24 (*)    Calcium 8.8 (*)    All other components within normal limits  CBC WITH DIFFERENTIAL/PLATELET - Abnormal; Notable for the following components:   Neutro Abs 8.2 (*)    All other components within normal limits  BLOOD GAS, VENOUS - Abnormal; Notable for the following components:   pH, Ven 7.437 (*)    pCO2, Ven 39.7 (*)    pO2, Ven 57.0 (*)    Acid-Base Excess 2.5 (*)    All other components within normal limits  CBG MONITORING, ED - Abnormal; Notable for the following components:   Glucose-Capillary 551 (*)    All other components within normal limits  CBG MONITORING, ED - Abnormal; Notable for the following components:   Glucose-Capillary 326 (*)    All other components within normal limits  BETA-HYDROXYBUTYRIC ACID  LIPASE, BLOOD  URINALYSIS, ROUTINE W REFLEX MICROSCOPIC     EKG None  Radiology DG Chest Port 1 View  Result Date: 11/04/2021 CLINICAL DATA:  Chest pain and shortness of breath for few days, history of COVID-19 positivity 2 days ago EXAM: PORTABLE CHEST 1 VIEW COMPARISON:  10/31/2021 FINDINGS: Cardiac shadow is stable. The lungs are well aerated bilaterally. No focal infiltrate is seen. Mild  left basilar atelectasis is noted. No bony abnormality is seen. IMPRESSION: Left basilar atelectasis.  No other focal abnormality is noted. Electronically Signed   By: Alcide CleverMark  Lukens M.D.   On: 11/04/2021 19:03    Procedures Procedures    Medications Ordered in ED Medications  sodium chloride 0.9 % bolus 1,000 mL (0 mLs Intravenous Stopped 11/04/21 2227)  insulin aspart (novoLOG) injection 8 Units (8 Units Subcutaneous Given 11/04/21 1938)    ED Course/ Medical Decision Making/ A&P Clinical Course as of 11/05/21 0002  Sat Nov 04, 2021  2119 PERC negative  [GL]    Clinical Course User Index [GL] Claudie LeachLoeffler, Jesselee Poth C, PA-C                           Medical Decision Making Problems Addressed: Bronchitis: acute illness or injury Hyperglycemia: acute illness or injury  Amount and/or Complexity of Data Reviewed Independent Historian:     Details: Independent historian Labs: ordered. Decision-making details documented in ED Course. Radiology: ordered and independent interpretation performed. Decision-making details documented in ED Course.  Risk Prescription drug management.   This is a 51 y.o. male with a PMH of hypertension, diabetes type 2, and TIAs who presents to the ED with worsening shortness of breath and sustained COVID-19 symptoms as well as hyperglycemia.  Initial BS is 551. Vitals are stable.  Exam is unremarkable with clear lung sounds.  Patient does not appear to be dyspneic at rest. Plan to obtain CXR to evaluate for PNA. Will also obtain abdominal labs, VBG, and BHB to assess for DKA.  He is PERC negative, so will not evaluate for  pulmonary embolism.  Symptoms not consistent with this.  I personally reviewed all laboratory work and imaging. Abnormal results outlined below. On labs, CBC is without leukocytosis or other abnormality.  CMP shows sodium 130, however corrected this should be normal because he has a glucose of 539.  There is no anion gap or acidosis on CMP.  BHB is also negative.  Blood gas does not show any acidosis as well.  Do not really need urinalysis at this time since we know she does not have ketones.  Lipase is negative.  We have given him a liter of fluids and 8 units of insulin for his blood sugar.  Repeat blood sugar was 326.  I feel comfortable with this at this time.  Chest x-ray does not reveal any signs of pneumonia.  I think is likely that patient's symptoms are a viral bronchitis.  No wheezing on exam, so doubt COPD element.  Apparently he is still on a prednisone taper from his previous COVID diagnosis.  Advised that he stop taking this as it is not really helping his symptoms and his blood sugar is quite elevated likely because of the steroids.  He should continue taking his Levaquin as prescribed.  I will also prescribe him antitussives.  He should follow-up with his PCP.  Return precautions provided if symptoms worsen.  Portions of this note were generated with Scientist, clinical (histocompatibility and immunogenetics)Dragon dictation software. Dictation errors may occur despite best attempts at proofreading.   Final Clinical Impression(s) / ED Diagnoses Final diagnoses:  Hyperglycemia  Bronchitis    Rx / DC Orders ED Discharge Orders          Ordered    promethazine-dextromethorphan (PROMETHAZINE-DM) 6.25-15 MG/5ML syrup  4 times daily PRN        11/04/21 2125  Claudie LeachLoeffler, Hadrian Yarbrough C, PA-C 11/05/21 0002    Vanetta MuldersZackowski, Scott, MD 11/05/21 [phone]

## 2021-11-04 NOTE — Discharge Instructions (Signed)
I have prescribed you a cough medication to pick up at the pharmacy.  You should stop taking the Prednisone therapy as it is causing your blood sugars to be severely elevated. Continue taking antibiotic course. Typically bronchitis will get better on its own. Please return if developing any concerning symptoms. Please follow up with your PCP regarding further insulin dosing for your elevated sugars while you are sick.

## 2021-11-04 NOTE — ED Triage Notes (Signed)
Dx with Covid 2 weeks ago. Has had antivirals/antibiotics/steroirds and increased SOB. Pt also reports diarrhea yesterday. Is diabetic

## 2021-11-04 NOTE — ED Notes (Signed)
Date and time results received: 11/04/21 1855  Test: Glucose Critical Value: 539  Name of Provider Notified: Dr.Zackowski  Orders Received? Or Actions Taken?: notified

## 2021-11-21 ENCOUNTER — Encounter: Payer: Self-pay | Admitting: Cardiology

## 2021-11-21 ENCOUNTER — Other Ambulatory Visit: Payer: Self-pay | Admitting: Cardiology

## 2021-11-21 ENCOUNTER — Ambulatory Visit (INDEPENDENT_AMBULATORY_CARE_PROVIDER_SITE_OTHER): Payer: 59

## 2021-11-21 ENCOUNTER — Other Ambulatory Visit: Payer: Self-pay

## 2021-11-21 DIAGNOSIS — R0602 Shortness of breath: Secondary | ICD-10-CM | POA: Diagnosis not present

## 2021-11-21 LAB — ECHOCARDIOGRAM COMPLETE
AR max vel: 3.07 cm2
AV Area VTI: 3.36 cm2
AV Area mean vel: 2.61 cm2
AV Mean grad: 5 mmHg
AV Peak grad: 8.1 mmHg
Ao pk vel: 1.42 m/s
Area-P 1/2: 2.28 cm2
Calc EF: 61.1 %
MV VTI: 3.67 cm2
S' Lateral: 3.57 cm
Single Plane A2C EF: 64 %
Single Plane A4C EF: 60.9 %

## 2021-11-28 ENCOUNTER — Telehealth: Payer: Self-pay | Admitting: Cardiology

## 2021-11-28 NOTE — Telephone Encounter (Signed)
Patient is calling for his echo results 

## 2021-11-28 NOTE — Telephone Encounter (Signed)
Echo shows normal heart function. Will discuss at our f/u  Dominga Ferry MD

## 2021-11-28 NOTE — Telephone Encounter (Signed)
Left message to return call 

## 2021-11-30 NOTE — Telephone Encounter (Signed)
Patient was returning calls for results. If you could please call after 4pm. Please advise.

## 2021-12-12 ENCOUNTER — Telehealth: Payer: Self-pay | Admitting: Cardiology

## 2021-12-12 NOTE — Telephone Encounter (Signed)
Patient notified via detailed voice message.

## 2021-12-12 NOTE — Telephone Encounter (Signed)
Gary Wyatt has upcoming appointment tomorrow with Dr. Harl Bowie. States that he had echo several weeks ago and was never called with the results.  He is wanting to know does he need to keep appointment or just get test results. He works 3rd shift. He has requested to be called at 830 am or 4:30 pm  to 5:00 pm ?

## 2021-12-12 NOTE — Telephone Encounter (Signed)
There was a result note from 11/28/21 and a nuring note saying patient was called, perhaps we did not reach him. Echo showed normal function. I have not seen him in about 1 year and would recommend keeping appt ? ?Dominga Ferry MD ?

## 2021-12-12 NOTE — Progress Notes (Unsigned)
Primary Care Physician: Gary Chroman, MD  Primary Gastroenterologist:  Gary Wyatt. Gary Chatters, DO   No chief complaint on file.   HPI: Gary Wyatt is a 51 y.o. male here for follow-up.  Last seen in the office in September 2022 for abdominal pain, alternating constipation and diarrhea.  Celiac labs unremarkable.  EGD October 2022: - Esophageal mucosal changes suspicious for eosinophilic esophagitis. Biopsied. - Z-line irregular, 40 cm from the incisors. Biopsied.  GEJ biopsy-no specific histopathological changes.  Negative for intestinal metaplasia or dysplasia.  Esophageal biopsy showed chronic esophagitis with vascular congestion, squamous ballooning and few intraepithelial eosinophils, consistent with reflux esophagitis. - Gastritis. Biopsied.  Reactive gastropathy.  No H. pylori. - Normal duodenal bulb, first portion of the duodenum and second portion of the duodenum.  Current Outpatient Medications  Medication Sig Dispense Refill   ALPRAZolam (XANAX) 0.5 MG tablet Take 0.5 mg by mouth 2 (two) times daily as needed for anxiety.     amLODipine (NORVASC) 10 MG tablet Take 1 tablet (10 mg total) by mouth daily. 90 tablet 1   aspirin 81 MG EC tablet Take 81 mg by mouth daily. Swallow whole.     chlorthalidone (HYGROTON) 25 MG tablet TAKE ONE TABLET ONCE DAILY 60 tablet 1   ibuprofen (ADVIL,MOTRIN) 200 MG tablet Take 400 mg by mouth every 6 (six) hours as needed for headache.     Ibuprofen-diphenhydrAMINE HCl 200-25 MG CAPS Take 1 capsule by mouth daily as needed (sleep).     methylphenidate (RITALIN) 20 MG tablet Take 20 mg by mouth 2 (two) times daily.      omeprazole (PRILOSEC) 20 MG capsule Take 1 capsule (20 mg total) by mouth 2 (two) times daily before a meal. 60 capsule 5   promethazine-dextromethorphan (PROMETHAZINE-DM) 6.25-15 MG/5ML syrup Take 5 mLs by mouth 4 (four) times daily as needed for cough. 118 mL 0   sildenafil (VIAGRA) 100 MG tablet Take 100 mg by mouth daily  as needed for erectile dysfunction.     TRESIBA FLEXTOUCH 200 UNIT/ML FlexTouch Pen Inject 28 Units into the skin daily at 6 (six) AM.     valsartan (DIOVAN) 320 MG tablet TAKE ONE TABLET ONCE DAILY 60 tablet 3   No current facility-administered medications for this visit.    Allergies as of 12/13/2021 - Review Complete 11/04/2021  Allergen Reaction Noted   Demerol [meperidine] Itching and Other (See Comments) 03/25/2012    ROS:  General: Negative for anorexia, weight loss, fever, chills, fatigue, weakness. ENT: Negative for hoarseness, difficulty swallowing , nasal congestion. CV: Negative for chest pain, angina, palpitations, dyspnea on exertion, peripheral edema.  Respiratory: Negative for dyspnea at rest, dyspnea on exertion, cough, sputum, wheezing.  GI: See history of present illness. GU:  Negative for dysuria, hematuria, urinary incontinence, urinary frequency, nocturnal urination.  Endo: Negative for unusual weight change.    Physical Examination:   There were no vitals taken for this visit.  General: Well-nourished, well-developed in no acute distress.  Eyes: No icterus. Mouth: Oropharyngeal mucosa moist and pink , no lesions erythema or exudate. Lungs: Clear to auscultation bilaterally.  Heart: Regular rate and rhythm, no murmurs rubs or gallops.  Abdomen: Bowel sounds are normal, nontender, nondistended, no hepatosplenomegaly or masses, no abdominal bruits or hernia , no rebound or guarding.   Extremities: No lower extremity edema. No clubbing or deformities. Neuro: Alert and oriented x 4   Skin: Warm and dry, no jaundice.   Psych: Alert  and cooperative, normal mood and affect.  Labs:   Labs from January 2023: Hemoglobin 17.3, platelets 267,000, glucose 365, AST 7, ALT 36, alk phos 100, total bilirubin 0.3, INR 0.79.  Imaging Studies: ECHOCARDIOGRAM COMPLETE  Result Date: 11/21/2021    ECHOCARDIOGRAM REPORT   Patient Name:   Gary Wyatt Date of Exam: 11/21/2021  Medical Rec #:  [id number]     Height:       74.0 in Accession #:    [id number]    Weight:       215.0 lb Date of Birth:  1971/08/07     BSA:          2.241 m Patient Age:    21 years      BP:           145/82 mmHg Patient Gender: M             HR:           81 bpm. Exam Location:  [facility] Procedure: 2D Echo, Cardiac Doppler, Color Doppler and Strain Analysis Indications:    R06.02 SOB  History:        Patient has prior history of Echocardiogram examinations, most                 recent 12/08/2012. Signs/Symptoms:Shortness of Breath and Chest                 Pain; Risk Factors:Hypertension, Non-Smoker, Diabetes and                 Dyslipidemia.  Sonographer:    Caesar Chestnut Referring Phys: [id number] Verta Ellen.  Sonographer Comments: Global longitudinal strain was attempted. IMPRESSIONS  1. Left ventricular ejection fraction, by estimation, is 55 to 60%. The left ventricle has normal function. The left ventricle has no regional wall motion abnormalities. Left ventricular diastolic parameters are indeterminate. Normal global longitudinla  strain of -17.1%.  2. Right ventricular systolic function is normal. The right ventricular size is normal. Tricuspid regurgitation signal is inadequate for assessing PA pressure.  3. The mitral valve is grossly normal. Trivial mitral valve regurgitation.  4. The aortic valve is tricuspid. Aortic valve regurgitation is not visualized. No aortic stenosis is present. Aortic valve mean gradient measures 5.0 mmHg.  5. The inferior vena cava is normal in size with greater than 50% respiratory variability, suggesting right atrial pressure of 3 mmHg.  6. Aortic dilatation noted. There is borderline dilatation of the aortic arch, measuring 35 mm. Comparison(s): Prior images unable to be directly viewed. FINDINGS  Left Ventricle: Left ventricular ejection fraction, by estimation, is 55 to 60%. The left ventricle has normal function. The left ventricle has no regional wall motion abnormalities.  The left ventricular internal cavity size was normal in size. There is  no left ventricular hypertrophy. Left ventricular diastolic parameters are indeterminate. Right Ventricle: The right ventricular size is normal. No increase in right ventricular wall thickness. Right ventricular systolic function is normal. Tricuspid regurgitation signal is inadequate for assessing PA pressure. Left Atrium: Left atrial size was normal in size. Right Atrium: Right atrial size was normal in size. Pericardium: There is no evidence of pericardial effusion. Mitral Valve: The mitral valve is grossly normal. Trivial mitral valve regurgitation. MV peak gradient, 3.2 mmHg. The mean mitral valve gradient is 2.0 mmHg. Tricuspid Valve: The tricuspid valve is grossly normal. Tricuspid valve regurgitation is mild. Aortic Valve: The aortic valve is tricuspid. There is mild aortic valve annular calcification.  Aortic valve regurgitation is not visualized. No aortic stenosis is present. Aortic valve mean gradient measures 5.0 mmHg. Aortic valve peak gradient measures 8.1 mmHg. Aortic valve area, by VTI measures 3.36 cm. Pulmonic Valve: The pulmonic valve was grossly normal. Pulmonic valve regurgitation is trivial. Aorta: The aortic root is normal in size and structure and aortic dilatation noted. There is borderline dilatation of the aortic arch, measuring 35 mm. Venous: The inferior vena cava is normal in size with greater than 50% respiratory variability, suggesting right atrial pressure of 3 mmHg. IAS/Shunts: No atrial level shunt detected by color flow Doppler.  LEFT VENTRICLE PLAX 2D LVIDd:         5.09 cm     Diastology LVIDs:         3.57 cm     LV e' medial:    6.41 cm/s LV PW:         1.11 cm     LV E/e' medial:  11.0 LV IVS:        0.88 cm     LV e' lateral:   9.65 cm/s LVOT diam:     2.10 cm     LV E/e' lateral: 7.3 LV SV:         89 LV SV Index:   40 LVOT Area:     3.46 cm  LV Volumes (MOD) LV vol d, MOD A2C: 82.0 ml LV vol d, MOD  A4C: 94.0 ml LV vol s, MOD A2C: 29.5 ml LV vol s, MOD A4C: 36.8 ml LV SV MOD A2C:     52.5 ml LV SV MOD A4C:     94.0 ml LV SV MOD BP:      54.4 ml RIGHT VENTRICLE RV Basal diam:  3.29 cm RV Mid diam:    2.95 cm RV S prime:     11.90 cm/s TAPSE (M-mode): 2.1 cm LEFT ATRIUM             Index        RIGHT ATRIUM           Index LA diam:        4.20 cm 1.87 cm/m   RA Area:     13.10 cm LA Vol (A2C):   51.0 ml 22.75 ml/m  RA Volume:   26.40 ml  11.78 ml/m LA Vol (A4C):   30.0 ml 13.39 ml/m LA Biplane Vol: 39.5 ml 17.62 ml/m  AORTIC VALVE                     PULMONIC VALVE AV Area (Vmax):    3.07 cm      PV Vmax:       0.95 m/s AV Area (Vmean):   2.61 cm      PV Peak grad:  3.6 mmHg AV Area (VTI):     3.36 cm AV Vmax:           142.00 cm/s AV Vmean:          101.000 cm/s AV VTI:            0.266 m AV Peak Grad:      8.1 mmHg AV Mean Grad:      5.0 mmHg LVOT Vmax:         126.00 cm/s LVOT Vmean:        76.200 cm/s LVOT VTI:          0.258 m LVOT/AV VTI ratio: 0.97  AORTA Ao Root diam: 3.00  cm Ao Asc diam:  3.70 cm Ao Arch diam: 3.5 cm MITRAL VALVE MV Area (PHT): 2.28 cm    SHUNTS MV Area VTI:   3.67 cm    Systemic VTI:  0.26 m MV Peak grad:  3.2 mmHg    Systemic Diam: 2.10 cm MV Mean grad:  2.0 mmHg MV Vmax:       0.90 m/s MV Vmean:      64.3 cm/s MV Decel Time: 333 msec MV E velocity: 70.30 cm/s MV A velocity: 92.20 cm/s MV E/A ratio:  0.76 Rozann Lesches MD Electronically signed by Rozann Lesches MD Signature Date/Time: 11/21/2021/1:21:49 PM    Final      Assessment:     Plan:

## 2021-12-12 NOTE — Telephone Encounter (Signed)
Fwd to provider as results are on his desktop awaiting final disposition.  

## 2021-12-13 ENCOUNTER — Encounter: Payer: Self-pay | Admitting: Internal Medicine

## 2021-12-13 ENCOUNTER — Ambulatory Visit: Payer: 59 | Admitting: Cardiology

## 2021-12-13 ENCOUNTER — Ambulatory Visit: Payer: 59 | Admitting: Gastroenterology

## 2021-12-13 NOTE — Telephone Encounter (Signed)
Patient informed and verbalized understanding of plan. 

## 2021-12-13 NOTE — Progress Notes (Deleted)
? ? ? ?Clinical Summary ?Gary Wyatt is a 51 y.o.male ? ?1. HTN ?- compliant with meds ?- home bp's 160-180s/90s-110s ?  ?  ?- no snoring, no witnessed apneic episodes, some chronic fatigue/hypersomnolence ?- no significant NSAID use ?- just occasional EtOH, once a month ? ?2.SOB ?11/2021 echo LVEF 55-60%, indet diastolic ? ? ? ? ?3. Chest pain ?-07/2020 nuclear stress: no ischemia ?  ?Past Medical History:  ?Diagnosis Date  ? Anxiety   ? Depression   ? Diabetes mellitus (HCC)   ? type 2  ? Hypertension   ? Insomnia   ? Kidney stone   ? Narcolepsy   ? TIA (transient ischemic attack)   ? ? ? ?Allergies  ?Allergen Reactions  ? Demerol [Meperidine] Itching and Other (See Comments)  ?  irritation  ? ? ? ?Current Outpatient Medications  ?Medication Sig Dispense Refill  ? ALPRAZolam (XANAX) 0.5 MG tablet Take 0.5 mg by mouth 2 (two) times daily as needed for anxiety.    ? amLODipine (NORVASC) 10 MG tablet Take 1 tablet (10 mg total) by mouth daily. 90 tablet 1  ? aspirin 81 MG EC tablet Take 81 mg by mouth daily. Swallow whole.    ? chlorthalidone (HYGROTON) 25 MG tablet TAKE ONE TABLET ONCE DAILY 60 tablet 1  ? ibuprofen (ADVIL,MOTRIN) 200 MG tablet Take 400 mg by mouth every 6 (six) hours as needed for headache.    ? Ibuprofen-diphenhydrAMINE HCl 200-25 MG CAPS Take 1 capsule by mouth daily as needed (sleep).    ? methylphenidate (RITALIN) 20 MG tablet Take 20 mg by mouth 2 (two) times daily.     ? omeprazole (PRILOSEC) 20 MG capsule Take 1 capsule (20 mg total) by mouth 2 (two) times daily before a meal. 60 capsule 5  ? promethazine-dextromethorphan (PROMETHAZINE-DM) 6.25-15 MG/5ML syrup Take 5 mLs by mouth 4 (four) times daily as needed for cough. 118 mL 0  ? sildenafil (VIAGRA) 100 MG tablet Take 100 mg by mouth daily as needed for erectile dysfunction.    ? TRESIBA FLEXTOUCH 200 UNIT/ML FlexTouch Pen Inject 28 Units into the skin daily at 6 (six) AM.    ? valsartan (DIOVAN) 320 MG tablet TAKE ONE TABLET ONCE DAILY 60  tablet 3  ? ?No current facility-administered medications for this visit.  ? ? ? ?Past Surgical History:  ?Procedure Laterality Date  ? BIOPSY  07/25/2021  ? Procedure: BIOPSY;  Surgeon: Lanelle Bal, DO;  Location: [facility];  Service: Endoscopy;;  ? ESOPHAGOGASTRODUODENOSCOPY (EGD) WITH PROPOFOL N/A 07/25/2021  ? Procedure: ESOPHAGOGASTRODUODENOSCOPY (EGD) WITH PROPOFOL;  Surgeon: Lanelle Bal, DO;  Location: [facility];  Service: Endoscopy;  Laterality: N/A;  7:30am  ? NO PAST SURGERIES    ? ? ? ?Allergies  ?Allergen Reactions  ? Demerol [Meperidine] Itching and Other (See Comments)  ?  irritation  ? ? ? ? ?Family History  ?Problem Relation Age of Onset  ? Hypertension Mother   ? Diabetes Father   ? CAD Father   ? Prostate cancer Father   ? Colon cancer Neg Hx   ? ? ? ?Social History ?Gary Wyatt reports that he has never smoked. He has never been exposed to tobacco smoke. His smokeless tobacco use includes snuff. ?Gary Wyatt reports current alcohol use. ? ? ?Review of Systems ?CONSTITUTIONAL: No weight loss, fever, chills, weakness or fatigue.  ?HEENT: Eyes: No visual loss, blurred vision, double vision or yellow sclerae.No hearing loss, sneezing, congestion, runny  nose or sore throat.  ?SKIN: No rash or itching.  ?CARDIOVASCULAR:  ?RESPIRATORY: No shortness of breath, cough or sputum.  ?GASTROINTESTINAL: No anorexia, nausea, vomiting or diarrhea. No abdominal pain or blood.  ?GENITOURINARY: No burning on urination, no polyuria ?NEUROLOGICAL: No headache, dizziness, syncope, paralysis, ataxia, numbness or tingling in the extremities. No change in bowel or bladder control.  ?MUSCULOSKELETAL: No muscle, back pain, joint pain or stiffness.  ?LYMPHATICS: No enlarged nodes. No history of splenectomy.  ?PSYCHIATRIC: No history of depression or anxiety.  ?ENDOCRINOLOGIC: No reports of sweating, cold or heat intolerance. No polyuria or polydipsia.  ?. ? ? ?Physical Examination ?There were no vitals filed  for this visit. ?There were no vitals filed for this visit. ? ?Gen: resting comfortably, no acute distress ?HEENT: no scleral icterus, pupils equal round and reactive, no palptable cervical adenopathy,  ?CV ?Resp: Clear to auscultation bilaterally ?GI: abdomen is soft, non-tender, non-distended, normal bowel sounds, no hepatosplenomegaly ?MSK: extremities are warm, no edema.  ?Skin: warm, no rash ?Neuro:  no focal deficits ?Psych: appropriate affect ? ? ?Diagnostic Studies ? ?07/2020 nuclear stress [facility] ?1. No reversible ischemia or infarction.  ? ?2. Normal left ventricular wall motion.  ? ?3. Left ventricular ejection fraction 56%  ? ?4. Non invasive risk stratification*: Low   ? ? ?07/2020 nuclear stress ?1. No reversible ischemia or infarction.  ? ?2. Normal left ventricular wall motion.  ? ?3. Left ventricular ejection fraction 56%  ? ?4. Non invasive risk stratification*: Low  ? ? ?11/2021 echo ?1. Left ventricular ejection fraction, by estimation, is 55 to 60%. The  ?left ventricle has normal function. The left ventricle has no regional  ?wall motion abnormalities. Left ventricular diastolic parameters are  ?indeterminate. Normal global longitudinla  ? strain of -17.1%.  ? 2. Right ventricular systolic function is normal. The right ventricular  ?size is normal. Tricuspid regurgitation signal is inadequate for assessing  ?PA pressure.  ? 3. The mitral valve is grossly normal. Trivial mitral valve  ?regurgitation.  ? 4. The aortic valve is tricuspid. Aortic valve regurgitation is not  ?visualized. No aortic stenosis is present. Aortic valve mean gradient  ?measures 5.0 mmHg.  ? 5. The inferior vena cava is normal in size with greater than 50%  ?respiratory variability, suggesting right atrial pressure of 3 mmHg.  ? 6. Aortic dilatation noted. There is borderline dilatation of the aortic  ?arch, measuring 35 mm.  ? ? ?Assessment and Plan  ?1. HTN ?- difficult to control bp's ?- stop diovan HCT. Start valsartan by  itself 320 mg daily and chlorthlaidone 25mg  daily ?- in 2 weeks check bmet/mg/tsh/renin/aldo ratio ?- he will update on bp's in 1 week. Could consider changing toprol to more bp effective beta blocker like coreg or labetalol, could consider aldactone for resistant HTN as well ?- if ongoing issues with bp control would consider sleep study, renal artery [place] ?- given info on DASH diet, discussed low sodium diet ?  ?- work note given as he reports cannot work unless bp <150/100, will take a few days for these changes to take effect, excuse given for Mon through Thurs this week.  ? ? ? ? ? ?[place], M.D. ?

## 2021-12-13 NOTE — Telephone Encounter (Signed)
Antoine Poche, MD ?to Lesle Chris, LPN   ?   9:67 PM ?Note ?Echo shows normal heart function. Will discuss at our f/u ?  ?Dominga Ferry MD  ?  ? ?

## 2021-12-13 NOTE — Telephone Encounter (Signed)
?  pt said he is under the impression that the nurse cancelled his appt. he said he did not get the VM from [facility] yesterday. He still requesting to get a call regarding his echo result ?

## 2021-12-20 ENCOUNTER — Other Ambulatory Visit: Payer: Self-pay | Admitting: Internal Medicine

## 2021-12-20 ENCOUNTER — Other Ambulatory Visit (HOSPITAL_COMMUNITY): Payer: Self-pay | Admitting: Internal Medicine

## 2021-12-20 DIAGNOSIS — R109 Unspecified abdominal pain: Secondary | ICD-10-CM

## 2021-12-21 ENCOUNTER — Ambulatory Visit (HOSPITAL_BASED_OUTPATIENT_CLINIC_OR_DEPARTMENT_OTHER): Payer: 59

## 2021-12-25 ENCOUNTER — Ambulatory Visit (HOSPITAL_COMMUNITY)
Admission: RE | Admit: 2021-12-25 | Discharge: 2021-12-25 | Disposition: A | Discharge status: Home / Self Care | Payer: 59 | Source: Ambulatory Visit | Attending: Internal Medicine | Admitting: Internal Medicine

## 2021-12-25 ENCOUNTER — Other Ambulatory Visit: Payer: Self-pay

## 2021-12-25 DIAGNOSIS — R109 Unspecified abdominal pain: Secondary | ICD-10-CM | POA: Insufficient documentation

## 2021-12-28 ENCOUNTER — Ambulatory Visit: Payer: 59 | Admitting: Cardiology

## 2021-12-28 NOTE — Progress Notes (Deleted)
? ? ? ?Clinical Summary ?Gary Wyatt is a 51 y.o.male ? ?1.Chest pain/SOB ?- seen by PA Vincenza Hews 08/2021 in clinic with chest pain ?- exertional pain with associated diaphoresis ?- 07/2020 nuclear stress: no ischemia. Exericsed nearly 10 minutes ? ? ?11/2021 echo: LVEF 55-60%, indet diastolic, normal RV ?  ?2. Epigastric pain ?- Recent EGD on 07/25/2021 showed esophageal mucosal changes suspicious for eosinophilic esophagitis ?  ?1. HTN ?- compliant with meds ?- home bp's 160-180s/90s-110s ?  ?  ?- no snoring, no witnessed apneic episodes, some chronic fatigue/hypersomnolence ?- no significant NSAID use ?- just occasional EtOH, once a month ?  ?Past Medical History:  ?Diagnosis Date  ? Anxiety   ? Depression   ? Diabetes mellitus (HCC)   ? type 2  ? Hypertension   ? Insomnia   ? Kidney stone   ? Narcolepsy   ? TIA (transient ischemic attack)   ? ? ? ?Allergies  ?Allergen Reactions  ? Demerol [Meperidine] Itching and Other (See Comments)  ?  irritation  ? ? ? ?Current Outpatient Medications  ?Medication Sig Dispense Refill  ? ALPRAZolam (XANAX) 0.5 MG tablet Take 0.5 mg by mouth 2 (two) times daily as needed for anxiety.    ? amLODipine (NORVASC) 10 MG tablet Take 1 tablet (10 mg total) by mouth daily. 90 tablet 1  ? aspirin 81 MG EC tablet Take 81 mg by mouth daily. Swallow whole.    ? chlorthalidone (HYGROTON) 25 MG tablet TAKE ONE TABLET ONCE DAILY 60 tablet 1  ? ibuprofen (ADVIL,MOTRIN) 200 MG tablet Take 400 mg by mouth every 6 (six) hours as needed for headache.    ? Ibuprofen-diphenhydrAMINE HCl 200-25 MG CAPS Take 1 capsule by mouth daily as needed (sleep).    ? methylphenidate (RITALIN) 20 MG tablet Take 20 mg by mouth 2 (two) times daily.     ? omeprazole (PRILOSEC) 20 MG capsule Take 1 capsule (20 mg total) by mouth 2 (two) times daily before a meal. 60 capsule 5  ? promethazine-dextromethorphan (PROMETHAZINE-DM) 6.25-15 MG/5ML syrup Take 5 mLs by mouth 4 (four) times daily as needed for cough. 118 mL 0  ?  sildenafil (VIAGRA) 100 MG tablet Take 100 mg by mouth daily as needed for erectile dysfunction.    ? TRESIBA FLEXTOUCH 200 UNIT/ML FlexTouch Pen Inject 28 Units into the skin daily at 6 (six) AM.    ? valsartan (DIOVAN) 320 MG tablet TAKE ONE TABLET ONCE DAILY 60 tablet 3  ? ?No current facility-administered medications for this visit.  ? ? ? ?Past Surgical History:  ?Procedure Laterality Date  ? BIOPSY  07/25/2021  ? Procedure: BIOPSY;  Surgeon: Lanelle Bal, DO;  [facility]: [facility];  Service: Endoscopy;;  ? ESOPHAGOGASTRODUODENOSCOPY (EGD) WITH PROPOFOL N/A 07/25/2021  ? Procedure: ESOPHAGOGASTRODUODENOSCOPY (EGD) WITH PROPOFOL;  Surgeon: Lanelle Bal, DO;  [facility]: [facility];  Service: Endoscopy;  Laterality: N/A;  7:30am  ? NO PAST SURGERIES    ? ? ? ?Allergies  ?Allergen Reactions  ? Demerol [Meperidine] Itching and Other (See Comments)  ?  irritation  ? ? ? ? ?Family History  ?Problem Relation Age of Onset  ? Hypertension Mother   ? Diabetes Father   ? CAD Father   ? Prostate cancer Father   ? Colon cancer Neg Hx   ? ? ? ?Social History ?Gary Wyatt reports that he has never smoked. He has never been exposed to tobacco smoke. His smokeless tobacco use includes  snuff. ?Gary Wyatt reports current alcohol use. ? ? ?Review of Systems ?CONSTITUTIONAL: No weight loss, fever, chills, weakness or fatigue.  ?HEENT: Eyes: No visual loss, blurred vision, double vision or yellow sclerae.No hearing loss, sneezing, congestion, runny nose or sore throat.  ?SKIN: No rash or itching.  ?CARDIOVASCULAR:  ?RESPIRATORY: No shortness of breath, cough or sputum.  ?GASTROINTESTINAL: No anorexia, nausea, vomiting or diarrhea. No abdominal pain or blood.  ?GENITOURINARY: No burning on urination, no polyuria ?NEUROLOGICAL: No headache, dizziness, syncope, paralysis, ataxia, numbness or tingling in the extremities. No change in bowel or bladder control.  ?MUSCULOSKELETAL: No muscle, back pain, joint pain or stiffness.   ?LYMPHATICS: No enlarged nodes. No history of splenectomy.  ?PSYCHIATRIC: No history of depression or anxiety.  ?ENDOCRINOLOGIC: No reports of sweating, cold or heat intolerance. No polyuria or polydipsia.  ?. ? ? ?Physical Examination ?There were no vitals filed for this visit. ?There were no vitals filed for this visit. ? ?Gen: resting comfortably, no acute distress ?HEENT: no scleral icterus, pupils equal round and reactive, no palptable cervical adenopathy,  ?CV ?Resp: Clear to auscultation bilaterally ?GI: abdomen is soft, non-tender, non-distended, normal bowel sounds, no hepatosplenomegaly ?MSK: extremities are warm, no edema.  ?Skin: warm, no rash ?Neuro:  no focal deficits ?Psych: appropriate affect ? ? ?Diagnostic Studies ?11/2021 echo ? 1. Left ventricular ejection fraction, by estimation, is 55 to 60%. The  ?left ventricle has normal function. The left ventricle has no regional  ?wall motion abnormalities. Left ventricular diastolic parameters are  ?indeterminate. Normal global longitudinla  ? strain of -17.1%.  ? 2. Right ventricular systolic function is normal. The right ventricular  ?size is normal. Tricuspid regurgitation signal is inadequate for assessing  ?PA pressure.  ? 3. The mitral valve is grossly normal. Trivial mitral valve  ?regurgitation.  ? 4. The aortic valve is tricuspid. Aortic valve regurgitation is not  ?visualized. No aortic stenosis is present. Aortic valve mean gradient  ?measures 5.0 mmHg.  ? 5. The inferior vena cava is normal in size with greater than 50%  ?respiratory variability, suggesting right atrial pressure of 3 mmHg.  ? 6. Aortic dilatation noted. There is borderline dilatation of the aortic  ?arch, measuring 35 mm.  ? ? ? ?Assessment and Plan  ?1. HTN ?- difficult to control bp's ?- stop diovan HCT. Start valsartan by itself 320 mg daily and chlorthlaidone 25mg  daily ?- in 2 weeks check bmet/mg/tsh/renin/aldo ratio ?- he will update on bp's in 1 week. Could consider  changing toprol to more bp effective beta blocker like coreg or labetalol, could consider aldactone for resistant HTN as well ?- if ongoing issues with bp control would consider sleep study, renal artery [place] ?- given info on DASH diet, discussed low sodium diet ?  ?- work note given as he reports cannot work unless bp <150/100, will take a few days for these changes to take effect, excuse given for Mon through Thurs this week.  ? ? ? ? ? ?[place], M.D., F.A.C.C. ?

## 2022-03-03 ENCOUNTER — Other Ambulatory Visit: Payer: Self-pay | Admitting: Cardiology

## 2022-04-04 ENCOUNTER — Other Ambulatory Visit: Payer: Self-pay | Admitting: Cardiology

## 2022-05-07 ENCOUNTER — Other Ambulatory Visit: Payer: Self-pay | Admitting: Cardiology

## 2022-06-01 ENCOUNTER — Encounter: Payer: Self-pay | Admitting: Urology

## 2022-06-01 ENCOUNTER — Ambulatory Visit (INDEPENDENT_AMBULATORY_CARE_PROVIDER_SITE_OTHER): Payer: 59 | Admitting: Urology

## 2022-06-01 VITALS — BP 162/98 | HR 91

## 2022-06-01 DIAGNOSIS — N486 Induration penis plastica: Secondary | ICD-10-CM

## 2022-06-01 NOTE — Progress Notes (Signed)
06/01/2022 12:21 PM   Gary Wyatt 09-Aug-1971 [id number]  Referring provider: Ignatius SpeckingVyas, Dhruv B, MD [address] [place],  [place] [id number]  Penile curvature   HPI: Gary Wyatt is a 51yo here for evaluation of penile curvature. For over 1 year he has noted dorsal curvature. The curvature has been stable for over 6 months. No pain with erections. No pain for partner. He has lost penile length. No issues with urination./ No prior peyronies therapy. He has difficulty maintaining an erection since he noted the curvature. No trauma that he has noted.    PMH: Past Medical History:  Diagnosis Date   Anxiety    Depression    Diabetes mellitus (HCC)    type 2   Hypertension    Insomnia    Kidney stone    Narcolepsy    TIA (transient ischemic attack)     Surgical History: Past Surgical History:  Procedure Laterality Date   BIOPSY  07/25/2021   Procedure: BIOPSY;  Surgeon: Lanelle Balarver, Charles K, DO;  [facility];  Service: Endoscopy;;   ESOPHAGOGASTRODUODENOSCOPY (EGD) WITH PROPOFOL N/A 07/25/2021   Procedure: ESOPHAGOGASTRODUODENOSCOPY (EGD) WITH PROPOFOL;  Surgeon: Lanelle Balarver, Charles K, DO;  [facility];  Service: Endoscopy;  Laterality: N/A;  7:30am   NO PAST SURGERIES      Home Medications:  Allergies as of 06/01/2022       Reactions   Demerol [meperidine] Itching, Other (See Comments)   irritation        Medication List        Accurate as of June 01, 2022 12:21 PM. If you have any questions, ask your nurse or doctor.          ALPRAZolam 0.5 MG tablet Commonly known as: XANAX Take 0.5 mg by mouth 2 (two) times daily as needed for anxiety.   amLODipine 10 MG tablet Commonly known as: NORVASC TAKE ONE TABLET ONCE DAILY   aspirin EC 81 MG tablet Take 81 mg by mouth daily. Swallow whole.   chlorthalidone 25 MG tablet Commonly known as: HYGROTON TAKE ONE TABLET ONCE DAILY   ibuprofen 200 MG tablet Commonly known as: ADVIL Take 400 mg by mouth  every 6 (six) hours as needed for headache.   Ibuprofen-diphenhydrAMINE HCl 200-25 MG Caps Take 1 capsule by mouth daily as needed (sleep).   methylphenidate 20 MG tablet Commonly known as: RITALIN Take 20 mg by mouth 2 (two) times daily.   omeprazole 20 MG capsule Commonly known as: PRILOSEC Take 1 capsule (20 mg total) by mouth 2 (two) times daily before a meal.   promethazine-dextromethorphan 6.25-15 MG/5ML syrup Commonly known as: PROMETHAZINE-DM Take 5 mLs by mouth 4 (four) times daily as needed for cough.   sildenafil 100 MG tablet Commonly known as: VIAGRA Take 100 mg by mouth daily as needed for erectile dysfunction.   Evaristo Buryresiba FlexTouch 200 UNIT/ML FlexTouch Pen Generic drug: insulin degludec Inject 28 Units into the skin daily at 6 (six) AM.   valsartan 320 MG tablet Commonly known as: DIOVAN TAKE ONE TABLET ONCE DAILY        Allergies:  Allergies  Allergen Reactions   Demerol [Meperidine] Itching and Other (See Comments)    irritation    Family History: Family History  Problem Relation Age of Onset   Hypertension Mother    Diabetes Father    CAD Father    Prostate cancer Father    Colon cancer Neg Hx     Social  History:  reports that he has never smoked. He has never been exposed to tobacco smoke. His smokeless tobacco use includes snuff. He reports current alcohol use. He reports that he does not use drugs.  ROS: All other review of systems were reviewed and are negative except what is noted above in HPI  Physical Exam: BP (!) 162/98   Pulse 91   Constitutional:  Alert and oriented, No acute distress. HEENT: [place] AT, moist mucus membranes.  Trachea midline, no masses. Cardiovascular: No clubbing, cyanosis, or edema. Respiratory: Normal respiratory effort, no increased work of breathing. GI: Abdomen is soft, nontender, nondistended, no abdominal masses GU: No CVA tenderness. Circumcised phallus. No masses/lesions on penis, testis, scrotum. 2cm  palpable dorsal peyronies plaque Lymph: No cervical or inguinal lymphadenopathy. Skin: No rashes, bruises or suspicious lesions. Neurologic: Grossly intact, no focal deficits, moving all 4 extremities. Psychiatric: Normal mood and affect.  Laboratory Data: Lab Results  Component Value Date   WBC 9.9 11/04/2021   HGB 16.3 11/04/2021   HCT 46.3 11/04/2021   MCV 82.5 11/04/2021   PLT 255 11/04/2021    Lab Results  Component Value Date   CREATININE 1.19 11/04/2021    No results found for: "PSA"  No results found for: "TESTOSTERONE"  No results found for: "HGBA1C"  Urinalysis    Component Value Date/Time   COLORURINE YELLOW 01/29/2015 0836   APPEARANCEUR HAZY (A) 01/29/2015 0836   LABSPEC >1.030 (H) 01/29/2015 0836   PHURINE 5.5 01/29/2015 0836   GLUCOSEU NEGATIVE 01/29/2015 0836   HGBUR NEGATIVE 01/29/2015 0836   BILIRUBINUR NEGATIVE 01/29/2015 0836   KETONESUR NEGATIVE 01/29/2015 0836   PROTEINUR NEGATIVE 01/29/2015 0836   UROBILINOGEN 0.2 01/29/2015 0836   NITRITE NEGATIVE 01/29/2015 0836   LEUKOCYTESUR NEGATIVE 01/29/2015 0836    Lab Results  Component Value Date   BACTERIA MANY (A) 01/24/2015    Pertinent Imaging:  Results for orders placed during the hospital encounter of 01/24/15  DG Abd 1 View  Narrative CLINICAL DATA:  Right flank pain for 2 hr with nausea and vomiting. History of kidney stones.  EXAM: ABDOMEN - 1 VIEW  COMPARISON:  CT abdomen and pelvis 10/25/2014  FINDINGS: No radiopaque calculi are identified overlying either kidney or along the expected courses of the ureters. No definite calculi are noted projecting over the bladder either. No dilated loops of bowel are seen. No acute osseous abnormality.  IMPRESSION: No radiopaque urinary tract calculi identified.   Electronically Signed By: Sebastian Ache On: 01/24/2015 21:56  No results found for this or any previous visit.  No results found for this or any previous visit.  No  results found for this or any previous visit.  No results found for this or any previous visit.  No results found for this or any previous visit.  No results found for this or any previous visit.  Results for orders placed during the hospital encounter of 01/29/15  CT RENAL STONE STUDY  Narrative CLINICAL DATA:  Right flank pain x 6 days. Nausea,vomiting,diarrhea,dysuria. Hx of kidney stone./  EXAM: CT ABDOMEN AND PELVIS WITHOUT CONTRAST  TECHNIQUE: Multidetector CT imaging of the abdomen and pelvis was performed following the standard protocol without IV contrast.  COMPARISON:  10/25/2014  FINDINGS: 2 mm stone projects into the bladder lumen from the right ureterovesicular junction. The right ureter is normal course and in caliber. There is no right hydronephrosis. No right-sided perinephric stranding.  No definite intrarenal stones. No renal masses. No bladder mass  or wall thickening.  Minor dependent subsegmental atelectasis at the lung bases. Heart is normal in size.  Liver, spleen, gallbladder, pancreas, adrenal glands:  Normal.  No adenopathy.  No ascites.  Few scattered sigmoid colon diverticula. No diverticulitis. Colon otherwise unremarkable. Normal small bowel. Normal appendix.  No osteoblastic or osteolytic lesions.  IMPRESSION: 1. 2 mm stone projects into the bladder lumen from the region of the right ureterovesicular junction. There is no dilation of the right intrarenal collecting system or of the right ureter. Stone may reside within the ureterovesicular junction or be within the bladder reflecting a recently passed ureteral stone. 2. No other evidence of an acute abnormality. No intrarenal stones or hydronephrosis on either side. 3. Normal appendix visualized.   Electronically Signed By: Amie Portland M.D. On: 01/29/2015 08:37   Assessment & Plan:    1. Peyronie disease We discussed the management of peyronies disease including medical  therapy, penile plication, verapamil therapy and xiaflex therapy. After discussed the options the patient elects for xiaflex therapy.Patient to bring pictures of his erection to next visit.    No follow-ups on file.  Wilkie Aye, MD  [facility] [place]

## 2022-06-07 ENCOUNTER — Other Ambulatory Visit: Payer: Self-pay | Admitting: Cardiology

## 2022-06-08 ENCOUNTER — Ambulatory Visit: Payer: 59 | Admitting: Urology

## 2022-06-12 ENCOUNTER — Other Ambulatory Visit: Payer: Self-pay | Admitting: Cardiology

## 2022-06-15 ENCOUNTER — Ambulatory Visit (INDEPENDENT_AMBULATORY_CARE_PROVIDER_SITE_OTHER): Payer: 59 | Admitting: Urology

## 2022-06-15 VITALS — BP 125/74 | HR 88

## 2022-06-15 DIAGNOSIS — N486 Induration penis plastica: Secondary | ICD-10-CM | POA: Diagnosis not present

## 2022-06-15 MED ORDER — COLLAGENASE CLOSTRID HISTOLYT 0.9 MG IJ SOLR: INTRAMUSCULAR | 3 refills | Status: DC

## 2022-06-15 MED ORDER — AMBULATORY NON FORMULARY MEDICATION: 3 refills | Status: DC

## 2022-06-15 NOTE — Progress Notes (Signed)
06/15/2022 10:10 AM   Gary Wyatt 23-May-1971 [id number]  Referring provider: [name], MD [address] [place],  [place] [address]  Followup peyronies   HPI: Gary Wyatt is a 51yo here for followup for peyronies disease. Based on pictures provided by the patient he has 85-90 degree dorsal curvature at the mid penile shaft. No pain with erections   PMH: Past Medical History:  Diagnosis Date   Anxiety    Depression    Diabetes mellitus (HCC)    type 2   Hypertension    Insomnia    Kidney stone    Narcolepsy    TIA (transient ischemic attack)     Surgical History: Past Surgical History:  Procedure Laterality Date   BIOPSY  07/25/2021   Procedure: BIOPSY;  Surgeon: Lanelle Bal, DO;  [facility];  Service: Endoscopy;;   ESOPHAGOGASTRODUODENOSCOPY (EGD) WITH PROPOFOL N/A 07/25/2021   Procedure: ESOPHAGOGASTRODUODENOSCOPY (EGD) WITH PROPOFOL;  Surgeon: Lanelle Bal, DO;  [facility];  Service: Endoscopy;  Laterality: N/A;  7:30am   NO PAST SURGERIES      Home Medications:  Allergies as of 06/15/2022       Reactions   Demerol [meperidine] Itching, Other (See Comments)   irritation        Medication List        Accurate as of June 15, 2022 10:10 AM. If you have any questions, ask your nurse or doctor.          ALPRAZolam 0.5 MG tablet Commonly known as: XANAX Take 0.5 mg by mouth 2 (two) times daily as needed for anxiety.   amLODipine 10 MG tablet Commonly known as: NORVASC TAKE ONE TABLET ONCE DAILY   aspirin EC 81 MG tablet Take 81 mg by mouth daily. Swallow whole.   chlorthalidone 25 MG tablet Commonly known as: HYGROTON TAKE ONE TABLET ONCE DAILY   ibuprofen 200 MG tablet Commonly known as: ADVIL Take 400 mg by mouth every 6 (six) hours as needed for headache.   Ibuprofen-diphenhydrAMINE HCl 200-25 MG Caps Take 1 capsule by mouth daily as needed (sleep).   methylphenidate 20 MG tablet Commonly known as:  RITALIN Take 20 mg by mouth 2 (two) times daily.   omeprazole 20 MG capsule Commonly known as: PRILOSEC Take 1 capsule (20 mg total) by mouth 2 (two) times daily before a meal.   promethazine-dextromethorphan 6.25-15 MG/5ML syrup Commonly known as: PROMETHAZINE-DM Take 5 mLs by mouth 4 (four) times daily as needed for cough.   sildenafil 100 MG tablet Commonly known as: VIAGRA Take 100 mg by mouth daily as needed for erectile dysfunction.   Evaristo Bury FlexTouch 200 UNIT/ML FlexTouch Pen Generic drug: insulin degludec Inject 28 Units into the skin daily at 6 (six) AM.   valsartan 320 MG tablet Commonly known as: DIOVAN TAKE ONE TABLET ONCE DAILY        Allergies:  Allergies  Allergen Reactions   Demerol [Meperidine] Itching and Other (See Comments)    irritation    Family History: Family History  Problem Relation Age of Onset   Hypertension Mother    Diabetes Father    CAD Father    Prostate cancer Father    Colon cancer Neg Hx     Social History:  reports that he has never smoked. He has never been exposed to tobacco smoke. His smokeless tobacco use includes snuff. He reports current alcohol use. He reports that he does not use drugs.  ROS: All other review of systems were reviewed and are negative except what is noted above in HPI  Physical Exam: BP 125/74   Pulse 88   Constitutional:  Alert and oriented, No acute distress. HEENT: [place] AT, moist mucus membranes.  Trachea midline, no masses. Cardiovascular: No clubbing, cyanosis, or edema. Respiratory: Normal respiratory effort, no increased work of breathing. GI: Abdomen is soft, nontender, nondistended, no abdominal masses GU: No CVA tenderness.  Lymph: No cervical or inguinal lymphadenopathy. Skin: No rashes, bruises or suspicious lesions. Neurologic: Grossly intact, no focal deficits, moving all 4 extremities. Psychiatric: Normal mood and affect.  Laboratory Data: Lab Results  Component Value Date    WBC 9.9 11/04/2021   HGB 16.3 11/04/2021   HCT 46.3 11/04/2021   MCV 82.5 11/04/2021   PLT 255 11/04/2021    Lab Results  Component Value Date   CREATININE 1.19 11/04/2021    No results found for: "PSA"  No results found for: "TESTOSTERONE"  No results found for: "HGBA1C"  Urinalysis    Component Value Date/Time   COLORURINE YELLOW 01/29/2015 0836   APPEARANCEUR HAZY (A) 01/29/2015 0836   LABSPEC >1.030 (H) 01/29/2015 0836   PHURINE 5.5 01/29/2015 0836   GLUCOSEU NEGATIVE 01/29/2015 0836   HGBUR NEGATIVE 01/29/2015 0836   BILIRUBINUR NEGATIVE 01/29/2015 0836   KETONESUR NEGATIVE 01/29/2015 0836   PROTEINUR NEGATIVE 01/29/2015 0836   UROBILINOGEN 0.2 01/29/2015 0836   NITRITE NEGATIVE 01/29/2015 0836   LEUKOCYTESUR NEGATIVE 01/29/2015 0836    Lab Results  Component Value Date   BACTERIA MANY (A) 01/24/2015    Pertinent Imaging:  Results for orders placed during the hospital encounter of 01/24/15  DG Abd 1 View  Narrative CLINICAL DATA:  Right flank pain for 2 hr with nausea and vomiting. History of kidney stones.  EXAM: ABDOMEN - 1 VIEW  COMPARISON:  CT abdomen and pelvis 10/25/2014  FINDINGS: No radiopaque calculi are identified overlying either kidney or along the expected courses of the ureters. No definite calculi are noted projecting over the bladder either. No dilated loops of bowel are seen. No acute osseous abnormality.  IMPRESSION: No radiopaque urinary tract calculi identified.   Electronically Signed By: Sebastian Ache On: 01/24/2015 21:56  No results found for this or any previous visit.  No results found for this or any previous visit.  No results found for this or any previous visit.  No results found for this or any previous visit.  No results found for this or any previous visit.  No results found for this or any previous visit.  Results for orders placed during the hospital encounter of 01/29/15  CT RENAL STONE  STUDY  Narrative CLINICAL DATA:  Right flank pain x 6 days. Nausea,vomiting,diarrhea,dysuria. Hx of kidney stone./  EXAM: CT ABDOMEN AND PELVIS WITHOUT CONTRAST  TECHNIQUE: Multidetector CT imaging of the abdomen and pelvis was performed following the standard protocol without IV contrast.  COMPARISON:  10/25/2014  FINDINGS: 2 mm stone projects into the bladder lumen from the right ureterovesicular junction. The right ureter is normal course and in caliber. There is no right hydronephrosis. No right-sided perinephric stranding.  No definite intrarenal stones. No renal masses. No bladder mass or wall thickening.  Minor dependent subsegmental atelectasis at the lung bases. Heart is normal in size.  Liver, spleen, gallbladder, pancreas, adrenal glands:  Normal.  No adenopathy.  No ascites.  Few scattered sigmoid colon diverticula. No diverticulitis. Colon otherwise unremarkable. Normal small bowel. Normal appendix.  No osteoblastic or osteolytic lesions.  IMPRESSION: 1. 2 mm stone projects into the bladder lumen from the region of the right ureterovesicular junction. There is no dilation of the right intrarenal collecting system or of the right ureter. Stone may reside within the ureterovesicular junction or be within the bladder reflecting a recently passed ureteral stone. 2. No other evidence of an acute abnormality. No intrarenal stones or hydronephrosis on either side. 3. Normal appendix visualized.   Electronically Signed By: Amie Portland M.D. On: 01/29/2015 08:37   Assessment & Plan:    1. Peyronie disease We discussed the management of peyronies disease including medical therapy, penile plication, verapamil therapy and xiaflex therapy. After discussed the options the patient elects for xiaflex therapy.  No follow-ups on file.  Wilkie Aye, MD  [facility] [place]

## 2022-06-26 ENCOUNTER — Encounter: Payer: Self-pay | Admitting: Urology

## 2022-06-26 NOTE — Patient Instructions (Signed)
Collagenase Injection (Dupuytren Contracture/Peyronie Disease) What is this medication? COLLAGENASE (kohl LAH jen ace) treats conditions caused by thickening of tissue in your body. It works by breaking down excess collagen in the tissue, which reduces stiffness and tightness. This medicine may be used for other purposes; ask your health care provider or pharmacist if you have questions. COMMON BRAND NAME(S): Xiaflex What should I tell my care team before I take this medication? They need to know if you have any of these conditions: Bleeding disorder An unusual or allergic reaction to collagenase, other medications, foods, dyes, or preservatives Pregnant or trying to get pregnant Breast-feeding How should I use this medication? This medication is injected into the affected area. It is given by your care team in a hospital or clinic setting. A special MedGuide will be given to you by the pharmacist with each prescription and refill. Be sure to read this information carefully each time. Talk to your care team about the use of this medication in children. Special care may be needed. Overdosage: If you think you have taken too much of this medicine contact a poison control center or emergency room at once. NOTE: This medicine is only for you. Do not share this medicine with others. What if I miss a dose? Keep appointments for follow-up doses. It is important not to miss your dose. Call your care team if you are unable to keep an appointment. What may interact with this medication? Aspirin and aspirin-like medications Certain medications that treat or prevent blood clots, such as warfarin, enoxaparin, dalteparin, apixaban, dabigatran, rivaroxaban This list may not describe all possible interactions. Give your health care provider a list of all the medicines, herbs, non-prescription drugs, or dietary supplements you use. Also tell them if you smoke, drink alcohol, or use illegal drugs. Some items may  interact with your medicine. What should I watch for while using this medication? Your condition will be monitored carefully while you are receiving this medication. If medication is for Dupuytren's Contracture, visit your care team 1 to 3 days after the injection. Until you visit your care team, do not flex or extend the fingers of your hand that was injected. Do not touch your finger that was injected. Elevate your hand until bedtime. Do not perform activity with the injected hand until you are told that it is ok. Follow any instructions about wearing a splint or performing finger exercises. Contact your care team as soon as possible if you get increasing redness or swelling in the hand, have numbness or tingling in the treated finger, or have trouble bending the finger after the swelling goes down. If medication is for Peyronie's disease, do not have sex between the first and second injections. Wait 4 weeks after the second injection and when there is no more pain or swelling in the penis to have sex. Avoid using vacuum erection devices during treatment with this medication. Try to avoid straining stomach muscles such as during bowel movements. Your care team will give you instructions on how to perform modeling activities at home. Contact your care team as soon as possible if you have severe pain or swelling in the penis, severe purple bruising and swelling of the penis, trouble passing urine, blood in urine, popping or cracking sound form the penis, or sudden loss of ability to maintain an erection. What side effects may I notice from receiving this medication? Side effects that you should report to your care team as soon as possible: Allergic reactions--skin rash,   itching, hives, swelling of the face, lips, tongue, or throat Feeling faint or lightheaded Skin infection--skin redness, swelling, warmth, or pain Severe back pain, chest pain, headache, trouble breathing after injection Snap or pop that  you feel or hear, severe pain, numbness, swelling, or bruising of or trouble moving in area where injected Side effects that usually do not require medical attention (report to your care team if they continue or are bothersome): Pain, redness, or irritation at injection site This list may not describe all possible side effects. Call your doctor for medical advice about side effects. You may report side effects to FDA at 1-800-FDA-1088. Where should I keep my medication? This medication is given in a hospital or clinic. It will not be stored at home. NOTE: This sheet is a summary. It may not cover all possible information. If you have questions about this medicine, talk to your doctor, pharmacist, or health care provider.  2023 Elsevier/Gold Standard (2021-05-11 00:00:00)  

## 2022-07-10 ENCOUNTER — Other Ambulatory Visit: Payer: Self-pay | Admitting: Cardiology

## 2022-07-20 ENCOUNTER — Other Ambulatory Visit: Payer: Self-pay | Admitting: Cardiology

## 2022-07-31 ENCOUNTER — Telehealth: Payer: Self-pay | Admitting: Cardiology

## 2022-07-31 MED ORDER — VALSARTAN 320 MG PO TABS: ORAL_TABLET | ORAL | 0 refills | Status: DC

## 2022-07-31 NOTE — Telephone Encounter (Signed)
*  STAT* If patient is at the pharmacy, call can be transferred to refill team.   1. Which medications need to be refilled? (please list name of each medication and dose if known) Valsartan  2. Which pharmacy/location (including street and city if local pharmacy) is medication to be sent to? [address], [place], [place]  3. Do they need a 30 day or 90 day supply? Enough until his appointment on 09-20-22-need this asap please

## 2022-08-08 ENCOUNTER — Other Ambulatory Visit: Payer: Self-pay

## 2022-08-08 ENCOUNTER — Telehealth: Payer: Self-pay

## 2022-08-08 ENCOUNTER — Ambulatory Visit: Payer: 59 | Admitting: Urology

## 2022-08-08 DIAGNOSIS — N486 Induration penis plastica: Secondary | ICD-10-CM

## 2022-08-08 NOTE — Telephone Encounter (Signed)
Spoke with patient and made him aware that medication was not in office.  Appointment will be rescheduled.  Medication sent as print instead of electronically. Message sent to MD to resend.

## 2022-08-08 NOTE — Telephone Encounter (Signed)
Called patient with no answer. Message left to call office concerning 11/01 appointment.

## 2022-08-09 ENCOUNTER — Other Ambulatory Visit: Payer: Self-pay | Admitting: Urology

## 2022-08-09 DIAGNOSIS — N486 Induration penis plastica: Secondary | ICD-10-CM

## 2022-08-09 MED ORDER — COLLAGENASE CLOSTRID HISTOLYT 0.9 MG IJ SOLR: INTRAMUSCULAR | 3 refills | Status: DC

## 2022-08-13 ENCOUNTER — Other Ambulatory Visit: Payer: Self-pay | Admitting: Cardiology

## 2022-08-20 ENCOUNTER — Telehealth: Payer: Self-pay

## 2022-08-20 NOTE — Telephone Encounter (Signed)
Xiaflex appeals started.  Faxed to [facility]

## 2022-09-03 ENCOUNTER — Other Ambulatory Visit: Payer: Self-pay | Admitting: Cardiology

## 2022-09-14 ENCOUNTER — Other Ambulatory Visit: Payer: Self-pay | Admitting: Cardiology

## 2022-09-19 NOTE — Progress Notes (Unsigned)
Cardiology Office Note:    Date:  09/20/2022   ID:  Gary Wyatt, DOB 15-Jul-1971, MRN [id number]  PCP:  [name], MD    [facility] Providers Cardiologist:  [name], MD     Referring MD: [name], MD   CC: Here for follow-up  History of Present Illness:    Gary Wyatt is a 51 y.o. male with a hx of the following:  Hx of TIA HTN T2DM Aortic dilatation History of chest pain GERD   Patient is a 51 year old male with past medical history as mentioned above.   In 2021 had a low risk stress test.  In 2022 when he saw Dr. [name], he reported difficulty with his blood pressures.  Valsartan was started at 320 mg daily, as well as chlorthalidone 25 mg daily.  Dr. [name] discussed changing Toprol-XL to more effective beta-blocker like labetalol or carvedilol in the future and possibly considering Aldactone for history of resistant hypertension.  He also mentioned that a renal artery ultrasound or sleep study could be considered if he were to have ongoing issues with his blood pressure.  His Toprol-XL was stopped and labetalol was started.  His blood pressures became low and he was symptomatic, therefore labetalol was stopped due to side effects.  Last seen in office by Rennis Harding, NP on August 22, 2021.  Was only taking amlodipine 5 mg daily instead of 10 mg daily.  He did not undergo echocardiogram because he had a upper endoscopy performed due to gastric pain.  He noted some chest pain at work associated with exertion, with diaphoresis.  Denied any other cardiac complaints or issues.  Amlodipine was increased to 10 mg daily.  Echocardiogram was rescheduled. Was told to follow-up in 2 months.   Echocardiogram in February 2023 revealed EF 55 to 60%, no RWMA, trivial MR, borderline dilatation of aortic arch, measuring 35 mm, otherwise no significant valvular abnormalities.  Today he presents for follow-up.  He he comes in today with  multiple chief concerns including chest pain, shortness of breath, lightheadedness, seizures, and ringing in the ears.  He states 2 weeks ago while asleep, his wife noticed he was tossing in the bed a lot more, pt woke up with the inside of his mouth being "chewed up" and felt very fatigued and went to sleep.  Wife stated to him that episode lasted around 20 minutes.  Has not happened before and did not seek ED care for this.  His primary care doctor recommended that if it happens again to call 911 immediately.  He also continues to have chronic chest pain, similar to 1 year ago.  Describes it as a tightness along lower chest and upper abdomen, nonradiating, last for few hours in duration, and not associated with meals.  Nothing makes the pain worse, intermittent in nature.  Has not tried any medications for this.  Also does mid to shortness of breath with exertion as well as associated symptoms of lightheadedness and ringing in the ears.  He has noticed that his shortness of breath has gotten worse since 1 year ago.  Says when he is out working in the yard he will have to stop to catch his breath.  Denies any palpitations, syncope, presyncope, dizziness, orthopnea, PND, swelling, significant weight changes, acute bleeding, or claudication.   Past Medical History:  Diagnosis Date   Anxiety    Depression    Diabetes mellitus (HCC)    type 2  Hypertension    Insomnia    Kidney stone    Narcolepsy    TIA (transient ischemic attack)     Past Surgical History:  Procedure Laterality Date   BIOPSY  07/25/2021   Procedure: BIOPSY;  Surgeon: Lanelle Bal, DO;  [facility]: [facility];  Service: Endoscopy;;   ESOPHAGOGASTRODUODENOSCOPY (EGD) WITH PROPOFOL N/A 07/25/2021   Procedure: ESOPHAGOGASTRODUODENOSCOPY (EGD) WITH PROPOFOL;  Surgeon: Lanelle Bal, DO;  Location: [facility];  Service: Endoscopy;  Laterality: N/A;  7:30am   NO PAST SURGERIES      Current Medications: Current Meds   Medication Sig   ALPRAZolam (XANAX) 0.5 MG tablet Take 0.5 mg by mouth 2 (two) times daily as needed for anxiety.   AMBULATORY NON FORMULARY MEDICATION Medication Name: prostaglandin 10 mcg/ml  Please bring medication to office   amLODipine (NORVASC) 10 MG tablet TAKE ONE TABLET ONCE DAILY   aspirin 81 MG EC tablet Take 81 mg by mouth daily. Swallow whole.   chlorthalidone (HYGROTON) 25 MG tablet TAKE ONE TABLET DAILY (NEED OFFICE VISIT)   Collagenase Clostrid Histolyt 0.9 MG SOLR Please Ship [address] [place], [place] [address] To be given in office.   ibuprofen (ADVIL,MOTRIN) 200 MG tablet Take 400 mg by mouth every 6 (six) hours as needed for headache.   Ibuprofen-diphenhydrAMINE HCl 200-25 MG CAPS Take 1 capsule by mouth daily as needed (sleep).   methylphenidate (RITALIN) 20 MG tablet Take 20 mg by mouth 2 (two) times daily.    promethazine-dextromethorphan (PROMETHAZINE-DM) 6.25-15 MG/5ML syrup Take 5 mLs by mouth 4 (four) times daily as needed for cough.   sildenafil (VIAGRA) 100 MG tablet Take 100 mg by mouth daily as needed for erectile dysfunction.   TRESIBA FLEXTOUCH 200 UNIT/ML FlexTouch Pen Inject 28 Units into the skin daily at 6 (six) AM.   valsartan (DIOVAN) 320 MG tablet TAKE ONE TABLET EVERY DAY    omeprazole (PRILOSEC) 20 MG capsule Take 1 capsule (20 mg total) by mouth 2 (two) times daily before a meal.     Allergies:   Demerol [meperidine]   Social History   Socioeconomic History   Marital status: Married    Spouse name: Not on file   Number of children: Not on file   Years of education: Not on file   Highest education level: Not on file  Occupational History   Not on file  Tobacco Use   Smoking status: Never    Passive exposure: Never   Smokeless tobacco: Current    Types: Snuff  Vaping Use   Vaping Use: Never used  Substance and Sexual Activity   Alcohol use: Yes    Comment: occas   Drug use: No   Sexual activity: Not on file  Other Topics  Concern   Not on file  Social History Narrative   Not on file   Social Determinants of Health   Financial Resource Strain: Not on file  Food Insecurity: Not on file  Transportation Needs: Not on file  Physical Activity: Not on file  Stress: Not on file  Social Connections: Not on file     Family History: The patient's family history includes CAD in his father; Diabetes in his father; Hypertension in his mother; Prostate cancer in his father. There is no history of Colon cancer.  ROS:   Review of Systems  Constitutional: Negative.   HENT:  Positive for tinnitus. Negative for congestion, ear discharge, ear pain, hearing loss, nosebleeds, sinus pain and  sore throat.        See HPI.  Eyes: Negative.   Respiratory:  Positive for shortness of breath. Negative for cough, hemoptysis, sputum production, wheezing and stridor.        See HPI.  Cardiovascular:  Positive for chest pain. Negative for palpitations, orthopnea, claudication, leg swelling and PND.       See HPI.  Gastrointestinal:  Positive for abdominal pain. Negative for blood in stool, constipation, diarrhea, heartburn, melena, nausea and vomiting.       Upper abdominal pain.  See HPI.  Genitourinary: Negative.   Musculoskeletal: Negative.   Skin: Negative.   Neurological:  Positive for seizures. Negative for dizziness, tingling, tremors, sensory change, speech change, focal weakness, loss of consciousness, weakness and headaches.       See HPI.  Endo/Heme/Allergies: Negative.   Psychiatric/Behavioral: Negative.      Please see the history of present illness.     All other systems reviewed and are negative.  EKGs/Labs/Other Studies Reviewed:    The following studies were reviewed today:   EKG:  EKG is ordered today.  The ekg ordered today demonstrates normal sinus rhythm, 79 bpm, with nonspecific ST segment changes, otherwise nothing acute.  Echocardiogram on 12/19/2021:  1. Left ventricular ejection fraction, by  estimation, is 55 to 60%. The  left ventricle has normal function. The left ventricle has no regional  wall motion abnormalities. Left ventricular diastolic parameters are  indeterminate. Normal global longitudinla   strain of -17.1%.   2. Right ventricular systolic function is normal. The right ventricular  size is normal. Tricuspid regurgitation signal is inadequate for assessing  PA pressure.   3. The mitral valve is grossly normal. Trivial mitral valve  regurgitation.   4. The aortic valve is tricuspid. Aortic valve regurgitation is not  visualized. No aortic stenosis is present. Aortic valve mean gradient  measures 5.0 mmHg.   5. The inferior vena cava is normal in size with greater than 50%  respiratory variability, suggesting right atrial pressure of 3 mmHg.   6. Aortic dilatation noted. There is borderline dilatation of the aortic  arch, measuring 35 mm.   Comparison(s): Prior images unable to be directly viewed.  Recent Labs: 11/04/2021: ALT 38; BUN 24; Creatinine, Ser 1.19; Hemoglobin 16.3; Platelets 255; Potassium 3.9; Sodium 130  Recent Lipid Panel No results found for: "CHOL", "TRIG", "HDL", "CHOLHDL", "VLDL", "LDLCALC", "LDLDIRECT"  Physical Exam:    VS:  BP 128/84   Pulse 86   Ht 6\' 2"  (1.88 m)   Wt 225 lb 6.4 oz (102.2 kg)   SpO2 94%   BMI 28.94 kg/m     Wt Readings from Last 3 Encounters:  09/20/22 225 lb 6.4 oz (102.2 kg)  11/04/21 215 lb (97.5 kg)  08/22/21 222 lb 3.2 oz (100.8 kg)     GEN: Well nourished, well developed 51 year old male in no acute distress HEENT: Normal NECK: No JVD; No carotid bruits CARDIAC: S1/S2, RRR, no murmurs, rubs, gallops; 2+ peripheral pulses throughout, strong equal bilaterally RESPIRATORY:  Clear and diminished to auscultation without rales, wheezing or rhonchi  MUSCULOSKELETAL:  No edema; No deformity  SKIN: Warm and dry NEUROLOGIC:  Alert and oriented x 3 PSYCHIATRIC:  Normal affect   ASSESSMENT:    1. Chest pain,  unspecified type   2. Hypertension, unspecified type   3. Pre-procedure lab exam   4. DOE (dyspnea on exertion)   5. Type 2 diabetes mellitus without complication, without long-term current use  of insulin (HCC)   6. Gastroesophageal reflux disease, unspecified whether esophagitis present   7. Seizure (HCC)   8. Aortic dilatation (HCC)    PLAN:    In order of problems listed above:  Chest pain of uncertain etiology Has been ongoing. Very atypical. Etiology uncertain. Did have low risk NST in 2021. Will arrange CCTA and Rx metoprolol to tartrate 100 mg daily to be taken 2 hours prior to testing.  Will obtain BMET prior to testing.  Continue aspirin. Heart healthy diet and regular cardiovascular exercise encouraged.  ED precautions discussed.  Hypertension Blood pressure stable today.  BP well-controlled at home.  Continue chlorthalidone, amlodipine, and valsartan. Discussed to monitor BP at home at least 2 hours after medications and sitting for 5-10 minutes. Heart healthy diet and regular cardiovascular exercise encouraged.   Shortness of breath with exertion TTE in February 2023 was overall reassuring.  Normal EF.  Arranging CCTA as mentioned above for ischemic evaluation.  If testing comes back WNL, and shortness of breath does not improve, plan to refer to pulmonology. Heart healthy diet and regular cardiovascular exercise encouraged.  Continue current medication regimen.  Continue to follow with PCP.  Type 2 diabetes No recent A1c on file.  PCP to manage. Heart , diabetic diet and regular cardiovascular exercise encouraged. Continue to follow with PCP.  GERD Patient states he had GI workup performed that came overall negative.  He has not been taking omeprazole for quite some time.  Will stop this medication and initiate pantoprazole 40 mg daily to see if this helps relieve some of his symptoms. Heart healthy diet and regular cardiovascular exercise encouraged.   Seizure? Questionable  recent seizure witnessed by wife.  Has not seen neurology.  I offered to make a referral, however he stated he would like to hold off for now and if this recurs, he stated he will call 911 immediately.  ED precautions discussed. Continue to follow with PCP.  7. Aortic dilatation Borderline dilatation of aortic arch noted on echocardiogram in February 2023, measured 35 mm.  Recommend repeating echocardiogram in February 2024. Plan to discuss at next OV.   8. Disposition: Follow-up with me in 6 to 8 weeks or sooner if anything changes    Medication Adjustments/Labs and Tests Ordered: Current medicines are reviewed at length with the patient today.  Concerns regarding medicines are outlined above.  Orders Placed This Encounter  Procedures   CT CORONARY MORPH W/CTA COR W/SCORE W/CA W/CM &/OR WO/CM   Basic metabolic panel   EKG 12-Lead   Meds ordered this encounter  Medications   metoprolol tartrate (LOPRESSOR) 100 MG tablet    Sig: Take 1 tablet (100 mg total) by mouth once for 1 dose.    Dispense:  1 tablet    Refill:  0   pantoprazole (PROTONIX) 40 MG tablet    Sig: Take 1 tablet (40 mg total) by mouth daily.    Dispense:  30 tablet    Refill:  6    Stopping Prilosec, med changed 09/20/2022    Patient Instructions  Medication Instructions:  Stop Prilosec (Omeprazole) Begin Pantoprazole 40mg  daily  Continue all other medications.     Labwork: BMET - order given today Do just prior to CT  Testing/Procedures: Coronary CTA  Follow-Up: 6-8 weeks  Any Other Special Instructions Will Be Listed Below (If Applicable).   If you need a refill on your cardiac medications before your next appointment, please call your pharmacy.  Signed, [name], NP  09/20/2022 2:39 PM    [facility]

## 2022-09-20 ENCOUNTER — Encounter: Payer: Self-pay | Admitting: *Deleted

## 2022-09-20 ENCOUNTER — Ambulatory Visit: Payer: 59 | Attending: Nurse Practitioner | Admitting: Nurse Practitioner

## 2022-09-20 ENCOUNTER — Encounter: Payer: Self-pay | Admitting: Nurse Practitioner

## 2022-09-20 VITALS — BP 128/84 | HR 86 | Ht 74.0 in | Wt 225.4 lb

## 2022-09-20 DIAGNOSIS — K219 Gastro-esophageal reflux disease without esophagitis: Secondary | ICD-10-CM

## 2022-09-20 DIAGNOSIS — E119 Type 2 diabetes mellitus without complications: Secondary | ICD-10-CM

## 2022-09-20 DIAGNOSIS — I77819 Aortic ectasia, unspecified site: Secondary | ICD-10-CM

## 2022-09-20 DIAGNOSIS — R079 Chest pain, unspecified: Secondary | ICD-10-CM

## 2022-09-20 DIAGNOSIS — Z01812 Encounter for preprocedural laboratory examination: Secondary | ICD-10-CM | POA: Diagnosis not present

## 2022-09-20 DIAGNOSIS — R0609 Other forms of dyspnea: Secondary | ICD-10-CM | POA: Diagnosis not present

## 2022-09-20 DIAGNOSIS — R569 Unspecified convulsions: Secondary | ICD-10-CM

## 2022-09-20 DIAGNOSIS — I10 Essential (primary) hypertension: Secondary | ICD-10-CM

## 2022-09-20 MED ORDER — METOPROLOL TARTRATE 100 MG PO TABS: 100.0000 mg | ORAL_TABLET | Freq: Once | ORAL | 0 refills | Status: DC

## 2022-09-20 MED ORDER — PANTOPRAZOLE SODIUM 40 MG PO TBEC: 40.0000 mg | DELAYED_RELEASE_TABLET | Freq: Every day | ORAL | 6 refills | Status: DC

## 2022-09-20 NOTE — Patient Instructions (Signed)
Medication Instructions:  Stop Prilosec (Omeprazole) Begin Pantoprazole 40mg  daily  Continue all other medications.     Labwork: BMET - order given today Do just prior to CT  Testing/Procedures: Coronary CTA  Follow-Up: 6-8 weeks  Any Other Special Instructions Will Be Listed Below (If Applicable).   If you need a refill on your cardiac medications before your next appointment, please call your pharmacy.

## 2022-09-24 ENCOUNTER — Other Ambulatory Visit: Payer: Self-pay | Admitting: Cardiology

## 2022-09-26 ENCOUNTER — Other Ambulatory Visit: Payer: Self-pay | Admitting: Nurse Practitioner

## 2022-10-02 ENCOUNTER — Ambulatory Visit (HOSPITAL_COMMUNITY): Payer: 59

## 2022-10-15 ENCOUNTER — Telehealth (HOSPITAL_COMMUNITY): Payer: Self-pay | Admitting: Emergency Medicine

## 2022-10-15 ENCOUNTER — Telehealth (HOSPITAL_COMMUNITY): Payer: Self-pay | Admitting: *Deleted

## 2022-10-15 NOTE — Telephone Encounter (Signed)
Attempted to call patient regarding upcoming cardiac CT appointment. Left message on voicemail with name and callback number  Dario Yono RN Navigator Cardiac [facility] [phone] Office [phone] Cell  Reminder for patient to obtain labs prior to appt. 

## 2022-10-15 NOTE — Telephone Encounter (Signed)
Reaching out to patient to offer assistance regarding upcoming cardiac imaging study; pt verbalizes understanding of appt date/time, parking situation and where to check in, pre-test NPO status and medications ordered, and verified current allergies; name and call back number provided for further questions should they arise Gary Bond RN [facility] [phone] office [phone] cell  Arrival 830 Aware to take 100mg  metop 7a Holding ritalin, viagra Aware contrast/nitro Getting lab work tomorrow at [facility] lab

## 2022-10-16 ENCOUNTER — Other Ambulatory Visit (HOSPITAL_COMMUNITY)
Admission: RE | Admit: 2022-10-16 | Discharge: 2022-10-16 | Disposition: A | Discharge status: Home / Self Care | Payer: 59 | Source: Ambulatory Visit | Attending: Nurse Practitioner | Admitting: Nurse Practitioner

## 2022-10-16 DIAGNOSIS — R079 Chest pain, unspecified: Secondary | ICD-10-CM

## 2022-10-16 LAB — BASIC METABOLIC PANEL
Anion gap: 10 (ref 5–15)
BUN: 22 mg/dL — ABNORMAL HIGH (ref 6–20)
CO2: 27 mmol/L (ref 22–32)
Calcium: 9.2 mg/dL (ref 8.9–10.3)
Chloride: 98 mmol/L (ref 98–111)
Creatinine, Ser: 1.04 mg/dL (ref 0.61–1.24)
GFR, Estimated: >60 mL/min (ref >60)
Glucose, Bld: 241 mg/dL — ABNORMAL HIGH (ref 70–99)
Potassium: 3.4 mmol/L — ABNORMAL LOW (ref 3.5–5.1)
Sodium: 135 mmol/L (ref 135–145)

## 2022-10-17 ENCOUNTER — Ambulatory Visit (HOSPITAL_COMMUNITY)
Admission: RE | Admit: 2022-10-17 | Discharge: 2022-10-17 | Disposition: A | Discharge status: Home / Self Care | Payer: 59 | Source: Ambulatory Visit | Attending: Nurse Practitioner | Admitting: Nurse Practitioner

## 2022-10-17 DIAGNOSIS — Z01812 Encounter for preprocedural laboratory examination: Secondary | ICD-10-CM

## 2022-10-17 DIAGNOSIS — R079 Chest pain, unspecified: Secondary | ICD-10-CM | POA: Diagnosis not present

## 2022-10-17 MED ORDER — NITROGLYCERIN 0.4 MG SL SUBL
SUBLINGUAL_TABLET | SUBLINGUAL | Status: AC
  Filled 2022-10-17: qty 2

## 2022-10-17 MED ORDER — IOHEXOL 350 MG/ML SOLN
95.0000 mL | Freq: Once | INTRAVENOUS | Status: AC | PRN
  Administered 2022-10-17: 95 mL via INTRAVENOUS

## 2022-10-17 MED ORDER — METOPROLOL TARTRATE 5 MG/5ML IV SOLN
10.0000 mg | INTRAVENOUS | Status: DC | PRN
  Administered 2022-10-17: 10 mg via INTRAVENOUS

## 2022-10-17 MED ORDER — METOPROLOL TARTRATE 5 MG/5ML IV SOLN
INTRAVENOUS | Status: AC
  Filled 2022-10-17: qty 20

## 2022-10-17 MED ORDER — NITROGLYCERIN 0.4 MG SL SUBL
0.8000 mg | SUBLINGUAL_TABLET | Freq: Once | SUBLINGUAL | Status: AC
  Administered 2022-10-17: 0.8 mg via SUBLINGUAL

## 2022-10-18 ENCOUNTER — Telehealth: Payer: Self-pay | Admitting: Cardiology

## 2022-10-18 NOTE — Telephone Encounter (Signed)
Pt c/o of Chest Pain: STAT if CP now or developed within 24 hours  1. Are you having CP right now? yes  2. Are you experiencing any other symptoms (ex. SOB, nausea, vomiting, sweating)? Sob   3. How long have you been experiencing CP? yesterday  4. Is your CP continuous or coming and going? Continuous   5. Have you taken Nitroglycerin? no ?

## 2022-10-18 NOTE — Telephone Encounter (Signed)
Reports chest pain all day and reports active chest pain rated 6/10. Reports dizziness and SOB. Advised to go to the ED for an evaluation. Verbalized understanding of plan.

## 2022-10-22 ENCOUNTER — Encounter: Payer: Self-pay | Admitting: Nurse Practitioner

## 2022-10-22 ENCOUNTER — Ambulatory Visit: Payer: 59 | Attending: Nurse Practitioner | Admitting: Nurse Practitioner

## 2022-10-22 VITALS — BP 100/68 | HR 95 | Ht 74.0 in | Wt 226.8 lb

## 2022-10-22 DIAGNOSIS — I10 Essential (primary) hypertension: Secondary | ICD-10-CM | POA: Diagnosis not present

## 2022-10-22 DIAGNOSIS — R42 Dizziness and giddiness: Secondary | ICD-10-CM

## 2022-10-22 DIAGNOSIS — I77819 Aortic ectasia, unspecified site: Secondary | ICD-10-CM

## 2022-10-22 DIAGNOSIS — E785 Hyperlipidemia, unspecified: Secondary | ICD-10-CM | POA: Diagnosis not present

## 2022-10-22 DIAGNOSIS — R569 Unspecified convulsions: Secondary | ICD-10-CM

## 2022-10-22 DIAGNOSIS — I25118 Atherosclerotic heart disease of native coronary artery with other forms of angina pectoris: Secondary | ICD-10-CM | POA: Diagnosis not present

## 2022-10-22 DIAGNOSIS — R0609 Other forms of dyspnea: Secondary | ICD-10-CM

## 2022-10-22 DIAGNOSIS — Z79899 Other long term (current) drug therapy: Secondary | ICD-10-CM

## 2022-10-22 DIAGNOSIS — E119 Type 2 diabetes mellitus without complications: Secondary | ICD-10-CM

## 2022-10-22 DIAGNOSIS — R55 Syncope and collapse: Secondary | ICD-10-CM

## 2022-10-22 DIAGNOSIS — K219 Gastro-esophageal reflux disease without esophagitis: Secondary | ICD-10-CM

## 2022-10-22 MED ORDER — ATORVASTATIN CALCIUM 20 MG PO TABS: 20.0000 mg | ORAL_TABLET | Freq: Every day | ORAL | 6 refills | Status: DC

## 2022-10-22 NOTE — Patient Instructions (Signed)
Medication Instructions:  Begin Atorvastatin 20mg  daily  Continue all other medications.     Labwork: FLP, LFT - orders given today  Reminder:  Nothing to eat or drink after 12 midnight prior to labs. Please do in 2 months   Testing/Procedures: Your physician has requested that you have an echocardiogram. Echocardiography is a painless test that uses sound waves to create images of your heart. It provides your doctor with information about the size and shape of your heart and how well your heart's chambers and valves are working. This procedure takes approximately one hour. There are no restrictions for this procedure. Please do NOT wear cologne, perfume, aftershave, or lotions (deodorant is allowed). Please arrive 15 minutes prior to your appointment time. Your physician has requested that you have a carotid duplex. This test is an ultrasound of the carotid arteries in your neck. It looks at blood flow through these arteries that supply the brain with blood. Allow one hour for this exam. There are no restrictions or special instructions. Your physician has recommended that you wear a 14 day event monitor. Event monitors are medical devices that record the heart's electrical activity. Doctors most often [place] these monitors to diagnose arrhythmias. Arrhythmias are problems with the speed or rhythm of the heartbeat. The monitor is a small, portable device. You can wear one while you do your normal daily activities. This is usually used to diagnose what is causing palpitations/syncope (passing out).  Follow-Up: 1 month  Any Other Special Instructions Will Be Listed Below (If Applicable).   If you need a refill on your cardiac medications before your next appointment, please call your pharmacy.

## 2022-10-22 NOTE — Progress Notes (Signed)
Cardiology Office Note:    Date:  10/22/2022   ID:  Gary Wyatt, DOB November 04, 1970, MRN [id number]  PCP:  Glenda Chroman, MD   [facility] Providers Cardiologist:  Carlyle Dolly, MD     Referring MD: Glenda Chroman, MD   CC: Here for follow-up  History of Present Illness:    Gary Wyatt is a 52 y.o. male with a hx of the following:  Mild nonobstructive CAD Hx of TIA HTN T2DM Aortic dilatation History of chest pain GERD   Patient is a 52 year old male with past medical history as mentioned above.   In 2021 had a low risk stress test.  In 2022 when he saw Dr. Carlyle Dolly, he reported difficulty with his blood pressures.  Valsartan was started at 320 mg daily, as well as chlorthalidone 25 mg daily.  Dr. Carlyle Dolly discussed changing Toprol-XL to more effective beta-blocker like labetalol or carvedilol in the future and possibly considering Aldactone for history of resistant hypertension.  He also mentioned that a renal artery ultrasound or sleep study could be considered if he were to have ongoing issues with his blood pressure.  His Toprol-XL was stopped and labetalol was started.  His blood pressures became low and he was symptomatic, therefore labetalol was stopped due to side effects.  Last seen in office by Levell July, NP on August 22, 2021.  Was only taking amlodipine 5 mg daily instead of 10 mg daily.  He did not undergo echocardiogram because he had a upper endoscopy performed due to gastric pain.  He noted some chest pain at work associated with exertion, with diaphoresis.  Denied any other cardiac complaints or issues.  Amlodipine was increased to 10 mg daily.  Echocardiogram was rescheduled. Was told to follow-up in 2 months.   Echocardiogram in February 2023 revealed EF 55 to 60%, no RWMA, trivial MR, borderline dilatation of aortic arch, measuring 35 mm, otherwise no significant valvular abnormalities.  Saw this patient on 09/20/2022. Had  several chief concerns including chest pain, shortness of breath, lightheadedness, seizures, and ringing in the ears. GF noticed he was tossing in the bed a lot more, pt woke up with the inside of his mouth being "chewed up" and felt very fatigued and went to sleep, episode lasted around 20 minutes.  Has not happened before and did not seek ED care for this.  His PCP recommended that if it happens again to call 911 immediately. Continued to have chronic chest pain, similar to 1 year ago.  Described it as a tightness along lower chest and upper abdomen, nonradiating, last for few hours in duration, and not associated with meals.  Nothing made the pain worse, intermittent in nature.  Has not tried any medications for this. Admitted to mild to shortness of breath with exertion as well as associated symptoms of lightheadedness and ringing in the ears.  He has noticed that his shortness of breath has gotten worse since 1 year ago.  Says when he is out working in the yard he will have to stop to catch his breath.  Denied any palpitations, syncope, presyncope, dizziness, orthopnea, PND, swelling, significant weight changes, acute bleeding, or claudication. CCTA arranged and revealed coronary calcium score 14.8, mild nonobstructive CAD (25-49%).  Recommended to consider nonatherosclerotic causes of chest pain and consider preventative therapy and risk factor modification.  Was told to follow-up in 6 to 8 weeks.  Today he presents for follow-up.  He states his symptoms have  not changed since last office visit.  Contacted our office on October 18, 2022 for chest pain, he was advised to seek ED care.  Presented to [facility] with chest pain.  Was also complaining of dizziness and shortness of breath, associated symptom including dizziness and "near syncopal episode in waiting room."  However he tells me he had 2 passing out spells, girlfriend observed these happening.  EKG was unremarkable.  Troponins were negative.  Was  discharged in stable condition.  Symptoms around the event included tingling in hands, feeling sick, also noted chest pain. Denies any recurrent syncopal episodes.  Denies any palpitations, presyncope, orthopnea, PND, swelling or significant weight changes, acute bleeding, or claudication.  Does note occasional orthostatic dizziness.  Tolerating his medications well.  Denies any other questions or concerns today.  Works third shift at [employer].  Denies any tobacco use.   Past Medical History:  Diagnosis Date   Anxiety    Depression    Diabetes mellitus (HCC)    type 2   Hypertension    Insomnia    Kidney stone    Narcolepsy    TIA (transient ischemic attack)     Past Surgical History:  Procedure Laterality Date   BIOPSY  07/25/2021   Procedure: BIOPSY;  Surgeon: Lanelle Bal, DO;  Location: [facility];  Service: Endoscopy;;   ESOPHAGOGASTRODUODENOSCOPY (EGD) WITH PROPOFOL N/A 07/25/2021   Procedure: ESOPHAGOGASTRODUODENOSCOPY (EGD) WITH PROPOFOL;  Surgeon: Lanelle Bal, DO;  Location: [facility];  Service: Endoscopy;  Laterality: N/A;  7:30am   NO PAST SURGERIES      Current Medications: Current Meds  Medication Sig   ALPRAZolam (XANAX) 0.5 MG tablet Take 0.5 mg by mouth 2 (two) times daily as needed for anxiety.   AMBULATORY NON FORMULARY MEDICATION Medication Name: prostaglandin 10 mcg/ml  Please bring medication to office   amLODipine (NORVASC) 10 MG tablet TAKE ONE TABLET ONCE DAILY   aspirin 81 MG EC tablet Take 81 mg by mouth daily. Swallow whole.   chlorthalidone (HYGROTON) 25 MG tablet TAKE ONE TABLET DAILY (NEED OFFICE VISIT)   methylphenidate (RITALIN) 20 MG tablet Take 20 mg by mouth 2 (two) times daily.    Pantoprazole (Protonix) 40 mg tablet Take 1 tablet by mouth daily.   sildenafil (VIAGRA) 100 MG tablet Take 100 mg by mouth daily as needed for erectile dysfunction.   TRESIBA FLEXTOUCH 200 UNIT/ML FlexTouch Pen Inject 28 Units into the  skin daily at 6 (six) AM.   valsartan (DIOVAN) 320 MG tablet TAKE ONE TABLET EVERY DAY     Allergies:   Demerol [meperidine]   Social History   Socioeconomic History   Marital status: Divorced    Spouse name: Not on file   Number of children: Not on file   Years of education: Not on file   Highest education level: Not on file  Occupational History   Not on file  Tobacco Use   Smoking status: Never    Passive exposure: Never   Smokeless tobacco: Current    Types: Snuff  Vaping Use   Vaping Use: Never used  Substance and Sexual Activity   Alcohol use: Yes    Comment: occas   Drug use: No   Sexual activity: Not on file  Other Topics Concern   Not on file  Social History Narrative   Not on file   Social Determinants of Health   Financial Resource Strain: Not on file  Food Insecurity: Not  on file  Transportation Needs: Not on file  Physical Activity: Not on file  Stress: Not on file  Social Connections: Not on file     Family History: The patient's family history includes CAD in his father; Diabetes in his father; Hypertension in his mother; Prostate cancer in his father. There is no history of Colon cancer.  ROS:   Review of Systems  Constitutional: Negative.   HENT: Negative.    Eyes: Negative.   Respiratory:  Positive for cough and shortness of breath. Negative for hemoptysis, sputum production and wheezing.        Recent URI symptoms.  Recommended to follow-up with PCP regarding this.  Cardiovascular:  Positive for chest pain. Negative for palpitations, orthopnea, claudication, leg swelling and PND.  Gastrointestinal: Negative.   Genitourinary: Negative.   Musculoskeletal:  Positive for neck pain. Negative for back pain, falls, joint pain and myalgias.  Skin: Negative.   Neurological:  Positive for dizziness and loss of consciousness. Negative for tingling, tremors, sensory change, speech change, focal weakness, seizures, weakness and headaches.        Syncopal episodes.  See HPI.  Endo/Heme/Allergies: Negative.   Psychiatric/Behavioral: Negative.       Please see the history of present illness.     All other systems reviewed and are negative.  EKGs/Labs/Other Studies Reviewed:    The following studies were reviewed today:   EKG:  EKG is ordered today.  The ekg ordered today demonstrates normal sinus rhythm, 84 bpm, with artifact noted in multiple leads, otherwise nothing acute.   Coronary CTA on October 17, 2022: IMPRESSION: 1. Coronary calcium score of 14.8. This was 62nd percentile for age-, sex, and race-matched controls.   2. Normal coronary origin with left dominance.   3. Mild non-calcified plaque in the mid LAD (25-49%).   4. Mild calcified plaque in the distal LCX (25-49%).   5. Minimal non-calcified plaque (<25%) in the non-dominant RCA.  6.  No significant extracardiac findings.   RECOMMENDATIONS: 1. Mild non-obstructive CAD (25-49%). Consider non-atherosclerotic causes of chest pain. Consider preventive therapy and risk factor modification.  Echocardiogram on 12/19/2021:  1. Left ventricular ejection fraction, by estimation, is 55 to 60%. The  left ventricle has normal function. The left ventricle has no regional  wall motion abnormalities. Left ventricular diastolic parameters are  indeterminate. Normal global longitudinla   strain of -17.1%.   2. Right ventricular systolic function is normal. The right ventricular  size is normal. Tricuspid regurgitation signal is inadequate for assessing  PA pressure.   3. The mitral valve is grossly normal. Trivial mitral valve  regurgitation.   4. The aortic valve is tricuspid. Aortic valve regurgitation is not  visualized. No aortic stenosis is present. Aortic valve mean gradient  measures 5.0 mmHg.   5. The inferior vena cava is normal in size with greater than 50%  respiratory variability, suggesting right atrial pressure of 3 mmHg.   6. Aortic dilatation  noted. There is borderline dilatation of the aortic  arch, measuring 35 mm.   Comparison(s): Prior images unable to be directly viewed.  Recent Labs: 11/04/2021: ALT 38; Hemoglobin 16.3; Platelets 255 10/16/2022: BUN 22; Creatinine, Ser 1.04; Potassium 3.4; Sodium 135  Recent Lipid Panel No results found for: "CHOL", "TRIG", "HDL", "CHOLHDL", "VLDL", "LDLCALC", "LDLDIRECT"  Physical Exam:    VS:  BP 100/68   Pulse 95   Ht 6\' 2"  (1.88 m)   Wt 226 lb 12.8 oz (102.9 kg)   SpO2  93%   BMI 29.12 kg/m     Wt Readings from Last 3 Encounters:  10/22/22 226 lb 12.8 oz (102.9 kg)  09/20/22 225 lb 6.4 oz (102.2 kg)  11/04/21 215 lb (97.5 kg)     GEN: Overweight, 52 year old male in no acute distress HEENT: Normal NECK: No JVD; No carotid bruits CARDIAC: S1/S2, RRR, no murmurs, rubs, gallops; 2+ peripheral pulses throughout, strong equal bilaterally RESPIRATORY:  Clear and diminished to auscultation without rales, wheezing or rhonchi  MUSCULOSKELETAL:  No edema; No deformity  SKIN: Warm and dry NEUROLOGIC:  Alert and oriented x 3 PSYCHIATRIC:  Normal affect   ASSESSMENT:    1. Coronary artery disease involving native heart with other form of angina pectoris, unspecified vessel or lesion type (HCC)   2. Hyperlipidemia, unspecified hyperlipidemia type   3. Essential hypertension, benign   4. DOE (dyspnea on exertion)   5. Type 2 diabetes mellitus without complication, without long-term current use of insulin (HCC)   6. Gastroesophageal reflux disease, unspecified whether esophagitis present   7. Seizure (HCC)   8. Syncope, unspecified syncope type   9. Orthostatic dizziness   10. Medication management   11. Aortic dilatation (HCC)    PLAN:    In order of problems listed above:  Mild nonobstructive CAD, hyperlipidemia Has been ongoing, states is similar since last office visit. Very atypical. Etiology less likely cardiac in nature, coronary CTA revealed mild nonobstructive CAD  and to consider nonatherosclerotic causes of chest pain and consider preventative therapy and risk factor modification. Did have low risk NST in 2021.  Recent workup at [facility] ED unremarkable.  Twelve-lead EKG today unremarkable.  Continue aspirin and will initiate atorvastatin 20 mg daily.  Will recheck FLP and hepatic function panel in 2 months per protocol.  Heart healthy diet and regular cardiovascular exercise encouraged.  ED precautions discussed.  Recommended to follow-up with PCP regarding URI symptoms.  Hypertension Blood pressure stable today.  BP well-controlled at home.  Continue chlorthalidone, amlodipine, and valsartan. Discussed to monitor BP at home at least 2 hours after medications and sitting for 5-10 minutes. Heart healthy diet and regular cardiovascular exercise encouraged.   Shortness of breath with exertion TTE in February 2023 was overall reassuring.  Normal EF.  Etiology multifactorial, but does not sound cardiac related.  Arranging 2D echocardiogram to rule out any acute cardiac causes.  Heart healthy diet and regular cardiovascular exercise encouraged.  Continue current medication regimen.  Continue to follow with PCP.   Type 2 diabetes No recent A1c on file.  PCP to manage. Heart , diabetic diet and regular cardiovascular exercise encouraged. Continue to follow with PCP.  GERD Continue pantoprazole 40 mg daily. Heart healthy diet and regular cardiovascular exercise encouraged.   Seizure, syncope, orthostatic dizziness Questionable past possible seizure witnessed by s/o.  Has not seen neurology.  I previously offered to make a referral, however he declined.  Had reported to syncopal episodes at last ED visit at [facility], witnessed by significant other.  Etiology unclear.  Said workup in ED was unremarkable.  Will arrange 14-day live monitor to rule out any cardiac arrhythmias.  ED precautions discussed as well as carotid doppler, updating Echo as well.   Strongly recommended to obtain orthostatic vital signs, however patient politely declines and would rather obtain this at next follow-up, will obtain at next follow-up.  Continue to follow with PCP.  I strongly advised and recommended patient to avoid driving at the current time for  workup to be done.  ED precautions discussed.  Discussed conservative measures for orthostatic dizziness including wearing compression stockings, changing positions slowly, and staying adequately hydrated.  He verbalized understanding.  7. Aortic dilatation Borderline dilatation of aortic arch noted on echocardiogram in February 2023, measured 35 mm.  Will update echocardiogram as mentioned above.    8. Disposition: Follow-up with me in 1 month or sooner if anything changes    Medication Adjustments/Labs and Tests Ordered: Current medicines are reviewed at length with the patient today.  Concerns regarding medicines are outlined above.  Orders Placed This Encounter  Procedures   Lipid panel   Hepatic function panel   EKG 12-Lead   ECHOCARDIOGRAM COMPLETE   VAS US CAROTID   Meds ordered this encounter  Medications   atorvastatin (LIPITOR) 20 MG tablet    Sig: Take 1 tablet (20 mg total) by mouth daily.    Dispense:  30 tablet    Refill:  6    New 10/22/2022    Patient Instructions  Medication Instructions:  Begin Atorvastatin 20mg  daily  Continue all other medications.     Labwork: FLP, LFT - orders given today  Reminder:  Nothing to eat or drink after 12 midnight prior to labs. Please do in 2 months   Testing/Procedures: Your physician has requested that you have an echocardiogram. Echocardiography is a painless test that uses sound waves to create images of your heart. It provides your doctor with information about the size and shape of your heart and how well your heart's chambers and valves are working. This procedure takes approximately one hour. There are no restrictions for this  procedure. Please do NOT wear cologne, perfume, aftershave, or lotions (deodorant is allowed). Please arrive 15 minutes prior to your appointment time. Your physician has requested that you have a carotid duplex. This test is an ultrasound of the carotid arteries in your neck. It looks at blood flow through these arteries that supply the brain with blood. Allow one hour for this exam. There are no restrictions or special instructions. Your physician has recommended that you wear a 14 day event monitor. Event monitors are medical devices that record the heart's electrical activity. Doctors most often [place] these monitors to diagnose arrhythmias. Arrhythmias are problems with the speed or rhythm of the heartbeat. The monitor is a small, portable device. You can wear one while you do your normal daily activities. This is usually used to diagnose what is causing palpitations/syncope (passing out).  Follow-Up: 1 month  Any Other Special Instructions Will Be Listed Below (If Applicable).   If you need a refill on your cardiac medications before your next appointment, please call your pharmacy.    SignedFinis Bud, NP  10/22/2022 10:27 AM    [place]

## 2022-10-23 ENCOUNTER — Other Ambulatory Visit: Payer: Self-pay | Admitting: Nurse Practitioner

## 2022-10-23 ENCOUNTER — Other Ambulatory Visit: Payer: 59

## 2022-10-23 DIAGNOSIS — R55 Syncope and collapse: Secondary | ICD-10-CM

## 2022-11-08 ENCOUNTER — Ambulatory Visit (INDEPENDENT_AMBULATORY_CARE_PROVIDER_SITE_OTHER): Payer: 59

## 2022-11-08 ENCOUNTER — Ambulatory Visit: Payer: 59 | Attending: Nurse Practitioner

## 2022-11-08 DIAGNOSIS — R0609 Other forms of dyspnea: Secondary | ICD-10-CM

## 2022-11-08 DIAGNOSIS — R55 Syncope and collapse: Secondary | ICD-10-CM

## 2022-11-09 LAB — ECHOCARDIOGRAM COMPLETE
AR max vel: 1.86 cm2
AV Area VTI: 2.08 cm2
AV Area mean vel: 1.89 cm2
AV Mean grad: 4.1 mmHg
AV Peak grad: 8.1 mmHg
Ao pk vel: 1.42 m/s
Area-P 1/2: 3.23 cm2
Calc EF: 60.6 %
P 1/2 time: 1631 msec
S' Lateral: 2.6 cm
Single Plane A2C EF: 57.9 %
Single Plane A4C EF: 61.1 %

## 2022-11-12 ENCOUNTER — Telehealth: Payer: Self-pay

## 2022-11-12 NOTE — Telephone Encounter (Signed)
Patient called advising that he is having dysuria along with a slower urine stream. Patient is concerned with a possible UTI.

## 2022-11-12 NOTE — Telephone Encounter (Signed)
Returned call with no answer. Message left offering a nurse visit urine drop off appointment today. Requested call back to office.

## 2022-11-13 ENCOUNTER — Ambulatory Visit (INDEPENDENT_AMBULATORY_CARE_PROVIDER_SITE_OTHER): Payer: 59 | Admitting: Urology

## 2022-11-13 DIAGNOSIS — R300 Dysuria: Secondary | ICD-10-CM

## 2022-11-13 LAB — URINALYSIS, ROUTINE W REFLEX MICROSCOPIC
Bilirubin, UA: NEGATIVE
Ketones, UA: NEGATIVE
Leukocytes,UA: NEGATIVE
Nitrite, UA: NEGATIVE
Protein,UA: NEGATIVE
RBC, UA: NEGATIVE
Specific Gravity, UA: 1.025 (ref 1.005–1.030)
Urobilinogen, Ur: 0.2 mg/dL (ref 0.2–1.0)
pH, UA: 5.5 (ref 5.0–7.5)

## 2022-11-13 NOTE — Telephone Encounter (Signed)
Patient dropped off specimen.  With results, if no answer.  Please leave a message.

## 2022-11-13 NOTE — Telephone Encounter (Signed)
Called patient concerning urine drop off with no answer.  UA results review with Dr. Alyson Ingles. Detailed message left stating ua showed no s/s of infection and per Dr. Alyson Ingles pt needs to try to keep diabetes under control.

## 2022-11-13 NOTE — Progress Notes (Signed)
Urine drop off due to UTI symptoms. UA results reviewed with Dr. Alyson Ingles.  Patient informed urine showed no sign of infection and to try to keep diabeties under control.

## 2022-11-19 LAB — URINE CULTURE

## 2022-11-20 ENCOUNTER — Other Ambulatory Visit: Payer: Self-pay | Admitting: Urology

## 2022-11-20 ENCOUNTER — Telehealth: Payer: Self-pay

## 2022-11-20 DIAGNOSIS — R300 Dysuria: Secondary | ICD-10-CM

## 2022-11-20 MED ORDER — NITROFURANTOIN MONOHYD MACRO 100 MG PO CAPS: 100.0000 mg | ORAL_CAPSULE | Freq: Two times a day (BID) | ORAL | 0 refills | Status: AC

## 2022-11-20 NOTE — Telephone Encounter (Signed)
-----   Message from Stephen Dahlstedt, MD sent at 11/20/2022 12:23 PM EST ----- Please call patient that urine culture was positive, I sent antibiotic in to his local pharmacy. ----- Message ----- From: Deshanta Lady R, CMA Sent: 11/19/2022   8:46 AM EST To: Stephen Dahlstedt, MD  Please review  

## 2022-11-20 NOTE — Telephone Encounter (Signed)
Tried calling patient with no answer. Left VM for return call

## 2022-11-21 ENCOUNTER — Other Ambulatory Visit: Payer: Self-pay | Admitting: Cardiology

## 2022-11-21 NOTE — Telephone Encounter (Signed)
-----   Message from Franchot Gallo, MD sent at 11/20/2022 12:23 PM EST ----- Please call patient that urine culture was positive, I sent antibiotic in to his local pharmacy. ----- Message ----- From: Sherrilyn Rist, CMA Sent: 11/19/2022   8:46 AM EST To: Franchot Gallo, MD  Please review

## 2022-11-21 NOTE — Telephone Encounter (Signed)
Patient return called and was made aware that his urine culture was positive for infection and antibx was sent to pharmacy. Patient voiced understanding

## 2022-11-21 NOTE — Telephone Encounter (Signed)
Tried calling patient with no answer. Left VM for return call

## 2022-11-22 ENCOUNTER — Encounter: Payer: Self-pay | Admitting: Nurse Practitioner

## 2022-11-22 ENCOUNTER — Encounter: Payer: Self-pay | Admitting: Internal Medicine

## 2022-11-22 ENCOUNTER — Ambulatory Visit: Payer: 59 | Attending: Nurse Practitioner | Admitting: Nurse Practitioner

## 2022-11-22 VITALS — BP 136/91 | HR 91 | Ht 74.0 in | Wt 221.6 lb

## 2022-11-22 DIAGNOSIS — R569 Unspecified convulsions: Secondary | ICD-10-CM

## 2022-11-22 DIAGNOSIS — R079 Chest pain, unspecified: Secondary | ICD-10-CM | POA: Diagnosis not present

## 2022-11-22 DIAGNOSIS — E119 Type 2 diabetes mellitus without complications: Secondary | ICD-10-CM

## 2022-11-22 DIAGNOSIS — I10 Essential (primary) hypertension: Secondary | ICD-10-CM

## 2022-11-22 DIAGNOSIS — R42 Dizziness and giddiness: Secondary | ICD-10-CM

## 2022-11-22 DIAGNOSIS — Z87898 Personal history of other specified conditions: Secondary | ICD-10-CM

## 2022-11-22 DIAGNOSIS — I25119 Atherosclerotic heart disease of native coronary artery with unspecified angina pectoris: Secondary | ICD-10-CM

## 2022-11-22 DIAGNOSIS — E785 Hyperlipidemia, unspecified: Secondary | ICD-10-CM

## 2022-11-22 DIAGNOSIS — I77819 Aortic ectasia, unspecified site: Secondary | ICD-10-CM

## 2022-11-22 DIAGNOSIS — R0609 Other forms of dyspnea: Secondary | ICD-10-CM

## 2022-11-22 DIAGNOSIS — K219 Gastro-esophageal reflux disease without esophagitis: Secondary | ICD-10-CM

## 2022-11-22 MED ORDER — METOPROLOL SUCCINATE ER 25 MG PO TB24: 25.0000 mg | ORAL_TABLET | Freq: Every day | ORAL | 3 refills | Status: DC

## 2022-11-22 MED ORDER — NITROGLYCERIN 0.4 MG SL SUBL: 0.4000 mg | SUBLINGUAL_TABLET | SUBLINGUAL | 3 refills | Status: DC | PRN

## 2022-11-22 NOTE — Patient Instructions (Signed)
Medication Instructions:  START metoprolol succinate (Toprol XL) 25 mg daily  Take sublingual nitroglycerin AS NEEDED for chest pain  *If you need a refill on your cardiac medications before your next appointment, please call your pharmacy*  Follow-Up: At [facility], you and your health needs are our priority.  As part of our continuing mission to provide you with exceptional heart care, we have created designated Provider Care Teams.  These Care Teams include your primary Cardiologist (physician) and Advanced Practice Providers (APPs -  Physician Assistants and Nurse Practitioners) who all work together to provide you with the care you need, when you need it.  We recommend signing up for the patient portal called "MyChart".  Sign up information is provided on this After Visit Summary.  MyChart is used to connect with patients for Virtual Visits (Telemedicine).  Patients are able to view lab/test results, encounter notes, upcoming appointments, etc.  Non-urgent messages can be sent to your provider as well.   To learn more about what you can do with MyChart, go to [email or web address].    Your next appointment:   1 month(s)  Provider:   Finis Bud, NP

## 2022-11-22 NOTE — Progress Notes (Signed)
Cardiology Office Note:    Date: 11/22/2022   ID:  Gary Wyatt, DOB 1970-11-17, MRN [id number]  PCP:  Gary Chroman, MD   [place] Prov[facility] Cardiologist:  Gary Dolly, MD     Referring MD: Gary Chroman, MD   CC: Here for follow-up  History of Present Illness:    Gary Wyatt is a 52 y.o. male with a hx of the following:  Mild nonobstructive CAD Hx of TIA HTN T2DM Aortic dilatation History of chest pain GERD   Patient is a 52 year old male with past medical history as mentioned above.   In 2021 had a low risk stress test.  In 2022 when he saw Dr. Carlyle Wyatt, he reported difficulty with his blood pressures.  Valsartan was started at 320 mg daily, as well as chlorthalidone 25 mg daily.  Dr. Carlyle Wyatt discussed changing Toprol-XL to more effective beta-blocker like labetalol or carvedilol in the future and possibly considering Aldactone for history of resistant hypertension.  He also mentioned that a renal artery ultrasound or sleep study could be considered if he were to have ongoing issues with his blood pressure.  His Toprol-XL was stopped and labetalol was started.  His blood pressures became low and he was symptomatic, therefore labetalol was stopped due to side effects.  Last seen in office by Gary July, NP on August 22, 2021.  Was only taking amlodipine 5 mg daily instead of 10 mg daily.  He did not undergo echocardiogram because he had a upper endoscopy performed due to gastric pain.  He noted some chest pain at work associated with exertion, with diaphoresis.  Denied any other cardiac complaints or issues.  Amlodipine was increased to 10 mg daily.  Echocardiogram was rescheduled. Was told to follow-up in 2 months.   Echocardiogram in February 2023 revealed EF 55 to 60%, no RWMA, trivial MR, borderline dilatation of aortic arch, measuring 35 mm, otherwise no significant valvular abnormalities.  09/20/2022 - saw pt for follow-up. Had  several chief concerns including chest pain, shortness of breath, lightheadedness, seizures, and ringing in the ears. Gary Wyatt noticed he was tossing in the bed a lot more, pt woke up with the inside of his mouth being "chewed up" and felt very fatigued and went to sleep, episode lasted around 20 minutes.  Has not happened before and did not seek ED care for this.  His PCP recommended that if it happens again to call 911 immediately. Continued to have chronic chest pain, similar to 1 year ago.  Described it as a tightness along lower chest and upper abdomen, nonradiating, last for few hours in duration, and not associated with meals.  Nothing made the pain worse, intermittent in nature.  Has not tried any medications for this. Admitted to mild to shortness of breath with exertion as well as associated symptoms of lightheadedness and ringing in the ears.  He has noticed that his shortness of breath has gotten worse since 1 year ago.  Says when he is out working in the yard he will have to stop to catch his breath.  Denied any palpitations, syncope, presyncope, dizziness, orthopnea, PND, swelling, significant weight changes, acute bleeding, or claudication. CCTA arranged and revealed coronary calcium score 14.8, mild nonobstructive CAD (25-49%).  Recommended to consider nonatherosclerotic causes of chest pain and consider preventative therapy and risk factor modification.  Was told to follow-up in 6 to 8 weeks.  10/22/2022 - Saw him for follow-up.  Contacted our office on  October 18, 2022 for chest pain, he was advised to seek ED care.  Presented to [facility] with chest pain.  Was also complaining of dizziness and shortness of breath, associated symptom including dizziness and "near syncopal episode in waiting room."  However he tells me he had 2 passing out spells, girlfriend observed these happening.  EKG was unremarkable.  Troponins were negative.  Was discharged in stable condition.  Symptoms around the event  included tingling in hands, feeling sick, also noted chest pain. Denies any recurrent syncopal episodes, still notes CP.  Denied any other associated symptoms. Does note occasional orthostatic dizziness.  Atorvastatin was initiated and Echo was overall unremarkable.   11/22/2022 - Today presents for follow-up. Continues to note similar CP, has had some episodes at work where it causes him to sit down. Not associated with exertion, random in occurrence. Described as left sided, tightness, non-radiating, 10/10 in intensity. Nothing seems to make CP worse. Gary Wyatt thinks "it could all be in my head." Says he is frustrated as it has been ongoing and seems to be getting worse over time. Also notes similar shortness of breath since last visit. Denies any palpitations, syncope, presyncope, dizziness, orthopnea, PND, swelling or significant weight changes, acute bleeding, or claudication.  SH: Works third shift at [employer].  Denies any tobacco use.   Past Medical History:  Diagnosis Date   Anxiety    Depression    Diabetes mellitus ([place])    type 2   Hypertension    Insomnia    Kidney stone    Narcolepsy    TIA (transient ischemic attack)     Past Surgical History:  Procedure Laterality Date   BIOPSY  07/25/2021   Procedure: BIOPSY;  Surgeon: Eloise Harman, DO;  Location: [facility];  Service: Endoscopy;;   ESOPHAGOGASTRODUODENOSCOPY (EGD) WITH PROPOFOL N/A 07/25/2021   Procedure: ESOPHAGOGASTRODUODENOSCOPY (EGD) WITH PROPOFOL;  Surgeon: Eloise Harman, DO;  Location: [facility];  Service: Endoscopy;  Laterality: N/A;  7:30am   NO PAST SURGERIES      Current Medications: Current Meds  Medication Sig   ALPRAZolam (XANAX) 0.5 MG tablet Take 0.5 mg by mouth 2 (two) times daily as needed for anxiety.   AMBULATORY NON FORMULARY MEDICATION Medication Name: prostaglandin 10 mcg/ml  Please bring medication to office   amLODipine (NORVASC) 10 MG tablet TAKE ONE TABLET ONCE DAILY    aspirin 81 MG EC tablet Take 81 mg by mouth daily. Swallow whole.   Atorvastatin (LIPITOR) 20 mg tablet Take 1 tablet (20 mg total) by mouth daily.    chlorthalidone (HYGROTON) 25 MG tablet TAKE ONE TABLET DAILY (NEED OFFICE VISIT)   methylphenidate (RITALIN) 20 MG tablet Take 20 mg by mouth 2 (two) times daily.    Nitrofurantoin, macrocrystal-monohydrate, (MACROBID) 100 mg capsule Take 1 capsule (100 mg total) by mouth 2 (two) times daily for 7 days.    Pantoprazole (Protonix) 40 mg tablet Take 1 tablet by mouth daily.   sildenafil (VIAGRA) 100 MG tablet Take 100 mg by mouth daily as needed for erectile dysfunction.   TRESIBA FLEXTOUCH 200 UNIT/ML FlexTouch Pen Inject 28 Units into the skin daily at 6 (six) AM.   valsartan (DIOVAN) 320 MG tablet TAKE ONE TABLET EVERY DAY     Allergies:   Demerol [meperidine]   Social History   Socioeconomic History   Marital status: Divorced    Spouse name: Not on file   Number of children: Not on file  Years of education: Not on file   Highest education level: Not on file  Occupational History   Not on file  Tobacco Use   Smoking status: Never    Passive exposure: Never   Smokeless tobacco: Current    Types: Snuff  Vaping Use   Vaping Use: Never used  Substance and Sexual Activity   Alcohol use: Yes    Comment: occas   Drug use: No   Sexual activity: Not on file  Other Topics Concern   Not on file  Social History Narrative   Not on file   Social Determinants of Health   Financial Resource Strain: Not on file  Food Insecurity: Not on file  Transportation Needs: Not on file  Physical Activity: Not on file  Stress: Not on file  Social Connections: Not on file     Family History: The patient's family history includes CAD in his father; Diabetes in his father; Hypertension in his mother; Prostate cancer in his father. There is no history of Colon cancer.  ROS:   Review of Systems  Constitutional: Negative.   HENT: Negative.     Eyes: Negative.   Respiratory:  Positive for shortness of breath. Negative for cough, hemoptysis, sputum production and wheezing.        Recent URI symptoms.  Recommended to follow-up with PCP regarding this.  Cardiovascular:  Positive for chest pain. Negative for palpitations, orthopnea, claudication, leg swelling and PND.  Gastrointestinal: Negative.   Genitourinary: Negative.   Musculoskeletal:  Positive for neck pain. Negative for back pain, falls, joint pain and myalgias.  Skin: Negative.   Neurological:  Negative for dizziness, tingling, tremors, sensory change, speech change, focal weakness, seizures, loss of consciousness, weakness and headaches.       Dizziness has improved, no recurrent syncopal episodes.  Endo/Heme/Allergies: Negative.   Psychiatric/Behavioral: Negative.       Please see the history of present illness.     All other systems reviewed and are negative.  EKGs/Labs/Other Studies Reviewed:    The following studies were reviewed today:   EKG:  EKG is ordered today.  The ekg ordered today demonstrates normal sinus rhythm, 82 bpm, otherwise nothing acute.  Carotid duplex on 11/11/2022:  L ICA stenosis 1-39% R ICA was near-normal with minimal wall thickening or plaque.   Echocardiogram on 11/09/2022:  1. Left ventricular ejection fraction, by estimation, is 60 to 65%. The  left ventricle has normal function. The left ventricle has no regional  wall motion abnormalities. Left ventricular diastolic parameters are  consistent with Grade I diastolic  dysfunction (impaired relaxation). The average left ventricular global  longitudinal strain is -18.6 %. The global longitudinal strain is normal.   2. Right ventricular systolic function is normal. The right ventricular  size is normal.   3. The mitral valve is normal in structure. No evidence of mitral valve  regurgitation. No evidence of mitral stenosis.   4. The aortic valve is tricuspid. Aortic valve regurgitation  is not  visualized. No aortic stenosis is present.   Comparison(s): Echocardiogram done 11/21/21 showed an EF of 55-60%.   Cardiac monitor results pending 10/23/2022  Coronary CTA on October 17, 2022: IMPRESSION: 1. Coronary calcium score of 14.8. This was 62nd percentile for age-, sex, and race-matched controls.   2. Normal coronary origin with left dominance.   3. Mild non-calcified plaque in the mid LAD (25-49%).   4. Mild calcified plaque in the distal LCX (25-49%).   5. Minimal non-calcified  plaque (<25%) in the non-dominant RCA.  6.  No significant extracardiac findings.   RECOMMENDATIONS: 1. Mild non-obstructive CAD (25-49%). Consider non-atherosclerotic causes of chest pain. Consider preventive therapy and risk factor modification.  Echocardiogram on 12/19/2021:  1. Left ventricular ejection fraction, by estimation, is 55 to 60%. The  left ventricle has normal function. The left ventricle has no regional  wall motion abnormalities. Left ventricular diastolic parameters are  indeterminate. Normal global longitudinla   strain of -17.1%.   2. Right ventricular systolic function is normal. The right ventricular  size is normal. Tricuspid regurgitation signal is inadequate for assessing  PA pressure.   3. The mitral valve is grossly normal. Trivial mitral valve  regurgitation.   4. The aortic valve is tricuspid. Aortic valve regurgitation is not  visualized. No aortic stenosis is present. Aortic valve mean gradient  measures 5.0 mmHg.   5. The inferior vena cava is normal in size with greater than 50%  respiratory variability, suggesting right atrial pressure of 3 mmHg.   6. Aortic dilatation noted. There is borderline dilatation of the aortic  arch, measuring 35 mm.   Comparison(s): Prior images unable to be directly viewed.  Recent Labs: 10/16/2022: BUN 22; Creatinine, Ser 1.04; Potassium 3.4; Sodium 135  Recent Lipid Panel No results found for: "CHOL", "TRIG",  "HDL", "CHOLHDL", "VLDL", "LDLCALC", "LDLDIRECT"  Physical Exam:    VS:  BP (!) 136/91   Pulse 91   Ht 6' 2"$  (1.88 m)   Wt 221 lb 9.6 oz (100.5 kg)   SpO2 98%   BMI 28.45 kg/m     Wt Readings from Last 3 Encounters:  11/22/22 221 lb 9.6 oz (100.5 kg)  10/22/22 226 lb 12.8 oz (102.9 kg)  09/20/22 225 lb 6.4 oz (102.2 kg)     Orthostatics today: Lying: 148/88, 83 heart rate Sitting: 143/91, 91 heart rate Standing: 127/84, 93 heart rate Standing for 3 minutes: 136/91, 99 heart rate  GEN: Overweight, 52 year old male in no acute distress HEENT: Normal NECK: No JVD; No carotid bruits CARDIAC: S1/S2, RRR, no murmurs, rubs, gallops; 2+ pulses  RESPIRATORY:  Clear and diminished to auscultation without rales, wheezing or rhonchi  MUSCULOSKELETAL:  No edema; No deformity  SKIN: Warm and dry NEUROLOGIC:  Alert and oriented x 3 PSYCHIATRIC:  Normal affect   ASSESSMENT:    1. Coronary artery disease involving native heart with angina pectoris, unspecified vessel or lesion type (HCC)   2. Chest pain, unspecified type   3. Hyperlipidemia, unspecified hyperlipidemia type   4. Essential hypertension, benign   5. DOE (dyspnea on exertion)   6. Type 2 diabetes mellitus without complication, without long-term current use of insulin (HCC)   7. Gastroesophageal reflux disease, unspecified whether esophagitis present   8. Seizure ([place])   9. History of syncope   10. Orthostatic dizziness   11. Aortic dilatation (HCC)    PLAN:    In order of problems listed above:  Mild nonobstructive CAD, chest pain, hyperlipidemia Has been ongoing for over 2 months, similar quality since last office visit, atypical, random in occurrence, not associated with exertion. CCTA revealed mild nonobstructive CAD and to consider nonatherosclerotic causes of chest pain and consider preventative therapy and risk factor modification. Did have low risk NST in 2021.  Reviewed past workup at [facility] ED  unremarkable.  Repeat Twelve-lead EKG today unremarkable, believe anxiety is contributing to his symptoms.  Continue aspirin and atorvastatin. Will initiate low dose Metoprolol succinate and Nitroglycerin PRN.  Discussed to avoid Viagra and NG use within 48 hours of one another, and he verbalized understanding. Heart healthy diet and regular cardiovascular exercise encouraged.  ED precautions discussed.    Hypertension Blood pressure elevated today.  BP well-controlled at home.  Discussed SBP goal < 130. Continue chlorthalidone, amlodipine, and valsartan. Initiating Toprol XL as mentioned above. Discussed to monitor BP at home at least 2 hours after medications and sitting for 5-10 minutes. Heart healthy diet and regular cardiovascular exercise encouraged.   Shortness of breath with exertion TTE in February 2024 was overall reassuring.  Normal EF.  Etiology multifactorial, but does not sound cardiac related.  Heart healthy diet and regular cardiovascular exercise encouraged.  Continue current medication regimen.  Continue to follow with PCP.   Type 2 diabetes No recent A1c on file.  PCP to manage. Heart , diabetic diet and regular cardiovascular exercise encouraged. Continue to follow with PCP.  GERD Continue pantoprazole 40 mg daily. Heart healthy diet and regular cardiovascular exercise encouraged.   Seizure?, hx of syncope, orthostatic dizziness Questionable past possible seizure witnessed by s/o.  Has not seen neurology.  I previously offered to make a referral, however he declined.  Had reported two syncopal episodes at last ED visit at [facility], witnessed by significant other.  Etiology unclear.  Said workup in ED was unremarkable.  14-day live monitor results have not been signed to rule out any cardiac arrhythmias, will await results and update pt once they are available. Echo and carotid doppler unremarkable. Orthostatics did reveal drop in BP on standing, but not considered orthostatic  hypotension. Dizziness has improved. Continue to follow with PCP.  Previously advised and recommended patient to avoid driving at the current time for workup to be done.  ED precautions discussed.  Discussed conservative measures for orthostatic dizziness including wearing compression stockings, changing positions slowly, and staying adequately hydrated.  He verbalized understanding.  7. Aortic dilatation Borderline dilatation of aortic arch noted on echocardiogram in February 2023, measured 35 mm.  Recent echo showed normal aortic root. Heart healthy diet and regular cardiovascular exercise encouraged.   8. Disposition: Follow-up with me in 1 month or sooner if anything changes. Said if symptoms are not better in few days of medication initiation to report to ED for evaluation.    Medication Adjustments/Labs and Tests Ordered: Current medicines are reviewed at length with the patient today.  Concerns regarding medicines are outlined above.  Orders Placed This Encounter  Procedures   EKG 12-Lead   Meds ordered this encounter  Medications   nitroGLYCERIN (NITROSTAT) 0.4 MG SL tablet    Sig: Place 1 tablet (0.4 mg total) under the tongue every 5 (five) minutes as needed.    Dispense:  25 tablet    Refill:  3   metoprolol succinate (TOPROL XL) 25 MG 24 hr tablet    Sig: Take 1 tablet (25 mg total) by mouth daily.    Dispense:  90 tablet    Refill:  3    Patient Instructions  Medication Instructions:  START metoprolol succinate (Toprol XL) 25 mg daily  Take sublingual nitroglycerin AS NEEDED for chest pain  *If you need a refill on your cardiac medications before your next appointment, please call your pharmacy*  Follow-Up: At [facility], you and your health needs are our priority.  As part of our continuing mission to provide you with exceptional heart care, we have created designated Provider Care Teams.  These Care Teams include your primary  Cardiologist (physician) and  Advanced Practice Providers (APPs -  Physician Assistants and Nurse Practitioners) who all work together to provide you with the care you need, when you need it.  We recommend signing up for the patient portal called "MyChart".  Sign up information is provided on this After Visit Summary.  MyChart is used to connect with patients for Virtual Visits (Telemedicine).  Patients are able to view lab/test results, encounter notes, upcoming appointments, etc.  Non-urgent messages can be sent to your provider as well.   To learn more about what you can do with MyChart, go to [email or web address].    Your next appointment:   1 month(s)  Provider:   Finis Bud, NP     Signed, Finis Bud, NP  11/22/2022 11:18 AM    [place]

## 2022-11-28 ENCOUNTER — Ambulatory Visit: Payer: 59 | Admitting: Urology

## 2022-11-30 ENCOUNTER — Ambulatory Visit: Payer: 59 | Admitting: Urology

## 2022-12-03 ENCOUNTER — Ambulatory Visit: Payer: 59 | Admitting: Urology

## 2022-12-04 ENCOUNTER — Telehealth: Payer: Self-pay | Admitting: Cardiology

## 2022-12-04 NOTE — Telephone Encounter (Signed)
Patient called stating that he would like the results of his heart monitor. Patient informed that the results are not available and as soon as we get them, we will give him a call with the results. Patient verbalized understanding.

## 2022-12-04 NOTE — Telephone Encounter (Signed)
Patient called to get test results.  Patient noted he works third shift and can leave detailed message on his VM.

## 2022-12-11 ENCOUNTER — Other Ambulatory Visit: Payer: Self-pay

## 2022-12-11 DIAGNOSIS — I998 Other disorder of circulatory system: Secondary | ICD-10-CM

## 2022-12-21 ENCOUNTER — Ambulatory Visit: Payer: 59 | Admitting: Nurse Practitioner

## 2022-12-25 DIAGNOSIS — E119 Type 2 diabetes mellitus without complications: Secondary | ICD-10-CM | POA: Insufficient documentation

## 2022-12-25 DIAGNOSIS — E1165 Type 2 diabetes mellitus with hyperglycemia: Secondary | ICD-10-CM | POA: Insufficient documentation

## 2022-12-25 DIAGNOSIS — M549 Dorsalgia, unspecified: Secondary | ICD-10-CM | POA: Insufficient documentation

## 2022-12-25 DIAGNOSIS — F1320 Sedative, hypnotic or anxiolytic dependence, uncomplicated: Secondary | ICD-10-CM | POA: Insufficient documentation

## 2022-12-25 DIAGNOSIS — M25511 Pain in right shoulder: Secondary | ICD-10-CM | POA: Insufficient documentation

## 2022-12-25 DIAGNOSIS — M940 Chondrocostal junction syndrome [Tietze]: Secondary | ICD-10-CM | POA: Insufficient documentation

## 2022-12-25 DIAGNOSIS — B351 Tinea unguium: Secondary | ICD-10-CM | POA: Insufficient documentation

## 2022-12-25 DIAGNOSIS — G459 Transient cerebral ischemic attack, unspecified: Secondary | ICD-10-CM | POA: Insufficient documentation

## 2022-12-25 DIAGNOSIS — G47419 Narcolepsy without cataplexy: Secondary | ICD-10-CM | POA: Insufficient documentation

## 2022-12-25 DIAGNOSIS — M79605 Pain in left leg: Secondary | ICD-10-CM | POA: Insufficient documentation

## 2022-12-25 DIAGNOSIS — D239 Other benign neoplasm of skin, unspecified: Secondary | ICD-10-CM | POA: Insufficient documentation

## 2022-12-25 DIAGNOSIS — N529 Male erectile dysfunction, unspecified: Secondary | ICD-10-CM | POA: Insufficient documentation

## 2022-12-25 DIAGNOSIS — R350 Frequency of micturition: Secondary | ICD-10-CM | POA: Insufficient documentation

## 2022-12-25 DIAGNOSIS — F339 Major depressive disorder, recurrent, unspecified: Secondary | ICD-10-CM | POA: Insufficient documentation

## 2022-12-25 DIAGNOSIS — F332 Major depressive disorder, recurrent severe without psychotic features: Secondary | ICD-10-CM | POA: Insufficient documentation

## 2022-12-25 DIAGNOSIS — N400 Enlarged prostate without lower urinary tract symptoms: Secondary | ICD-10-CM | POA: Insufficient documentation

## 2022-12-25 DIAGNOSIS — I700 Atherosclerosis of aorta: Secondary | ICD-10-CM | POA: Insufficient documentation

## 2022-12-25 DIAGNOSIS — F419 Anxiety disorder, unspecified: Secondary | ICD-10-CM | POA: Insufficient documentation

## 2022-12-26 ENCOUNTER — Encounter: Payer: 59 | Admitting: Vascular Surgery

## 2022-12-26 DIAGNOSIS — F1320 Sedative, hypnotic or anxiolytic dependence, uncomplicated: Secondary | ICD-10-CM

## 2022-12-26 DIAGNOSIS — M549 Dorsalgia, unspecified: Secondary | ICD-10-CM

## 2022-12-26 DIAGNOSIS — M25511 Pain in right shoulder: Secondary | ICD-10-CM

## 2022-12-26 DIAGNOSIS — E1165 Type 2 diabetes mellitus with hyperglycemia: Secondary | ICD-10-CM

## 2022-12-26 DIAGNOSIS — G47429 Narcolepsy in conditions classified elsewhere without cataplexy: Secondary | ICD-10-CM

## 2022-12-26 DIAGNOSIS — N529 Male erectile dysfunction, unspecified: Secondary | ICD-10-CM

## 2022-12-26 DIAGNOSIS — R350 Frequency of micturition: Secondary | ICD-10-CM

## 2022-12-26 DIAGNOSIS — G459 Transient cerebral ischemic attack, unspecified: Secondary | ICD-10-CM

## 2022-12-26 DIAGNOSIS — N400 Enlarged prostate without lower urinary tract symptoms: Secondary | ICD-10-CM

## 2022-12-26 DIAGNOSIS — F419 Anxiety disorder, unspecified: Secondary | ICD-10-CM

## 2022-12-26 DIAGNOSIS — D239 Other benign neoplasm of skin, unspecified: Secondary | ICD-10-CM

## 2022-12-26 DIAGNOSIS — I700 Atherosclerosis of aorta: Secondary | ICD-10-CM

## 2022-12-26 DIAGNOSIS — M940 Chondrocostal junction syndrome [Tietze]: Secondary | ICD-10-CM

## 2022-12-26 DIAGNOSIS — F332 Major depressive disorder, recurrent severe without psychotic features: Secondary | ICD-10-CM

## 2022-12-26 DIAGNOSIS — M79605 Pain in left leg: Secondary | ICD-10-CM

## 2022-12-26 DIAGNOSIS — B351 Tinea unguium: Secondary | ICD-10-CM

## 2022-12-28 ENCOUNTER — Encounter: Payer: Self-pay | Admitting: Nurse Practitioner

## 2022-12-28 ENCOUNTER — Ambulatory Visit: Payer: 59 | Attending: Nurse Practitioner | Admitting: Nurse Practitioner

## 2022-12-28 VITALS — BP 120/90 | HR 100 | Ht 74.0 in | Wt 219.8 lb

## 2022-12-28 DIAGNOSIS — E119 Type 2 diabetes mellitus without complications: Secondary | ICD-10-CM

## 2022-12-28 DIAGNOSIS — I2510 Atherosclerotic heart disease of native coronary artery without angina pectoris: Secondary | ICD-10-CM | POA: Diagnosis not present

## 2022-12-28 DIAGNOSIS — E785 Hyperlipidemia, unspecified: Secondary | ICD-10-CM

## 2022-12-28 DIAGNOSIS — R0602 Shortness of breath: Secondary | ICD-10-CM

## 2022-12-28 DIAGNOSIS — I77819 Aortic ectasia, unspecified site: Secondary | ICD-10-CM

## 2022-12-28 DIAGNOSIS — R002 Palpitations: Secondary | ICD-10-CM

## 2022-12-28 DIAGNOSIS — K219 Gastro-esophageal reflux disease without esophagitis: Secondary | ICD-10-CM

## 2022-12-28 DIAGNOSIS — R079 Chest pain, unspecified: Secondary | ICD-10-CM

## 2022-12-28 DIAGNOSIS — I2583 Coronary atherosclerosis due to lipid rich plaque: Secondary | ICD-10-CM

## 2022-12-28 DIAGNOSIS — R42 Dizziness and giddiness: Secondary | ICD-10-CM

## 2022-12-28 DIAGNOSIS — R0609 Other forms of dyspnea: Secondary | ICD-10-CM

## 2022-12-28 DIAGNOSIS — I10 Essential (primary) hypertension: Secondary | ICD-10-CM

## 2022-12-28 DIAGNOSIS — R569 Unspecified convulsions: Secondary | ICD-10-CM

## 2022-12-28 DIAGNOSIS — R55 Syncope and collapse: Secondary | ICD-10-CM

## 2022-12-28 MED ORDER — VALSARTAN 320 MG PO TABS: 160.0000 mg | ORAL_TABLET | Freq: Every day | ORAL | 1 refills | Status: DC

## 2022-12-28 MED ORDER — CHLORTHALIDONE 25 MG PO TABS: 12.5000 mg | ORAL_TABLET | Freq: Every day | ORAL | 1 refills | Status: DC

## 2022-12-28 MED ORDER — METOPROLOL TARTRATE 25 MG PO TABS: 25.0000 mg | ORAL_TABLET | Freq: Two times a day (BID) | ORAL | 1 refills | Status: DC

## 2022-12-28 MED ORDER — ISOSORBIDE MONONITRATE ER 30 MG PO TB24: 15.0000 mg | ORAL_TABLET | Freq: Every day | ORAL | 1 refills | Status: DC

## 2022-12-28 NOTE — Progress Notes (Unsigned)
Cardiology Office Note:    Date: 12/28/2022  ID:  Gary Wyatt, DOB March 31, 1971, MRN [id number]  PCP:  Glenda Chroman, MD   [facility] Cardiologist:  Carlyle Dolly, MD     Referring MD: Glenda Chroman, MD   CC: Here for follow-up  History of Present Illness:    Gary Wyatt is a 52 y.o. male with a hx of the following:  Mild nonobstructive CAD Hx of TIA HTN T2DM Aortic dilatation History of chest pain GERD   Patient is a 52 year old male with past medical history as mentioned above.   In 2021 had a low risk stress test. In 2022 when he saw Dr. Carlyle Dolly, he reported difficulty with his blood pressures.  Valsartan was started at 320 mg daily, as well as chlorthalidone.  Dr. Harl Bowie discussed changing Toprol-XL to more effective BB and possibly considering Aldactone for history of resistant HTN.  He also mentioned that a renal artery ultrasound or sleep study could be considered if he were to have ongoing issues with BP. Toprol-XL was stopped and labetalol was started. BP's became low and was symptomatic, labetalol was stopped.  TTE in February 2023 revealed EF 55 to 60%, no RWMA, trivial MR, borderline dilatation of aortic arch, measuring 35 mm, otherwise no significant valvular abnormalities.  09/20/2022 - saw pt for follow-up. CC included chest pain, shortness of breath, lightheadedness, seizures, and ringing in the ears. GF noticed he was tossing in the bed a lot more, pt woke up with the inside of his mouth being "chewed up" and felt very fatigued and went to sleep, episode lasted around 20 minutes.  Did not happen before and did not seek ED care. PCP recommended that if it happened again to call 911 immediately. Continued to have chronic chest pain, similar to 1 year ago.  Described it as tightness along lower chest and upper abdomen, nonradiating, x few hours in duration, not associated with meals.  Nothing made the pain worse, intermittent in nature.   Admitted to mild to shortness of breath with exertion as well as associated symptoms of lightheadedness and ringing in the ears. Noticed that his shortness of breath had gotten worse 1 year ago. CCTA arranged and revealed coronary calcium score 14.8, mild nonobstructive CAD (25-49%).  Recommended to consider nonatherosclerotic causes of chest pain and consider preventative therapy and risk factor modification.  Was told to follow-up in 6 to 8 weeks.  10/22/2022 - Saw him for follow-up.  Contacted our office on October 18, 2022 for chest pain, he was advised to seek ED care.  Presented to [facility] with chest pain.  Was also complaining of dizziness and shortness of breath, associated symptom including dizziness and "near syncopal episode in waiting room."  However he tells me he had 2 passing out spells, girlfriend observed these happening.  EKG was unremarkable.  Troponins were negative.  Was discharged in stable condition.  Symptoms around the event included tingling in hands, feeling sick, also noted chest pain. Denied recurrent syncopal episodes, still noteed CP.  Denied any other associated symptoms. Does note occasional orthostatic dizziness.  Atorvastatin was initiated and Echo was overall unremarkable.   11/22/2022 - Continued to note similar CP, had some episodes at work where it caused him to sit down. Not associated with exertion, random in occurrence. Described as left sided, tightness, non-radiating, 10/10 in intensity. Nothing seemed to make CP worse. Michela Pitcher thinks "it could all be in my head." Says he  is frustrated as it has been ongoing and seems to be getting worse over time. Also notes similar shortness of breath since last visit.   12/28/2022 - Presents for follow-up. Denies any resolution or improvement in his symptoms, including CP and SHOB. Said he had a brief syncopal episode while working outside, lasted for 3 seconds, denied any symptoms surrounding event. Admits to occasional  palpitations. Denies any dizziness, orthopnea, PND, swelling or significant weight changes, acute bleeding, or claudication.  SH: Works third shift at [employer].  Denies any tobacco use.   Past Medical History:  Diagnosis Date   Anxiety    Depression    Diabetes mellitus ([place])    type 2   Hypertension    Insomnia    Kidney stone    Narcolepsy    TIA (transient ischemic attack)     Past Surgical History:  Procedure Laterality Date   BIOPSY  07/25/2021   Procedure: BIOPSY;  Surgeon: Eloise Harman, DO;  [facility];  Service: Endoscopy;;   ESOPHAGOGASTRODUODENOSCOPY (EGD) WITH PROPOFOL N/A 07/25/2021   Procedure: ESOPHAGOGASTRODUODENOSCOPY (EGD) WITH PROPOFOL;  Surgeon: Eloise Harman, DO;  [facility];  Service: Endoscopy;  Laterality: N/A;  7:30am   NO PAST SURGERIES       Current Meds  Medication Sig   ALPRAZolam (XANAX) 0.5 MG tablet Take 0.5 mg by mouth 2 (two) times daily as needed for anxiety.   aspirin 81 MG EC tablet Take 81 mg by mouth daily. Swallow whole.   atorvastatin (LIPITOR) 20 MG tablet Take 1 tablet (20 mg total) by mouth daily.   methylphenidate (RITALIN) 20 MG tablet Take 20 mg by mouth 2 (two) times daily.    nitroGLYCERIN (NITROSTAT) 0.4 MG SL tablet Place 1 tablet (0.4 mg total) under the tongue every 5 (five) minutes as needed.   pantoprazole (PROTONIX) 40 MG tablet Take 1 tablet (40 mg total) by mouth daily.   sildenafil (VIAGRA) 100 MG tablet Take 100 mg by mouth daily as needed for erectile dysfunction.   TRESIBA FLEXTOUCH 200 UNIT/ML FlexTouch Pen Inject 32 Units into the skin daily at 6 (six) AM.   amLODipine (NORVASC) 10 MG tablet TAKE ONE TABLET ONCE DAILY   chlorthalidone (HYGROTON) 25 MG tablet Take 1 tablet (25 mg total) by mouth daily.    metoprolol succinate (TOPROL XL) 25 MG 24 hr tablet Take 1 tablet (25 mg total) by mouth daily.    valsartan (DIOVAN) 320 MG tablet TAKE ONE TABLET EVERY DAY. needs  appointment FOR refills   Allergies:   Demerol [meperidine]   Social History   Socioeconomic History   Marital status: Divorced    Spouse name: Not on file   Number of children: Not on file   Years of education: Not on file   Highest education level: Not on file  Occupational History   Not on file  Tobacco Use   Smoking status: Never    Passive exposure: Never   Smokeless tobacco: Current    Types: Snuff  Vaping Use   Vaping Use: Never used  Substance and Sexual Activity   Alcohol use: Yes    Comment: occas   Drug use: No   Sexual activity: Not on file  Other Topics Concern   Not on file  Social History Narrative   Not on file   Social Determinants of Health   Financial Resource Strain: Not on file  Food Insecurity: Not on file  Transportation Needs: Not  on file  Physical Activity: Not on file  Stress: Not on file  Social Connections: Not on file     Family History: The patient's family history includes CAD in his father; Diabetes in his father; Hypertension in his mother; Prostate cancer in his father. There is no history of Colon cancer.  ROS:      Please see the history of present illness.     All other systems reviewed and are negative.  EKGs/Labs/Other Studies Reviewed:    The following studies were reviewed today:   EKG:  EKG is not ordered today.     Carotid duplex on 11/11/2022:  L ICA stenosis 1-39% R ICA was near-normal with minimal wall thickening or plaque.   Echocardiogram on 11/09/2022:  1. Left ventricular ejection fraction, by estimation, is 60 to 65%. The  left ventricle has normal function. The left ventricle has no regional  wall motion abnormalities. Left ventricular diastolic parameters are  consistent with Grade I diastolic  dysfunction (impaired relaxation). The average left ventricular global  longitudinal strain is -18.6 %. The global longitudinal strain is normal.   2. Right ventricular systolic function is normal. The right  ventricular  size is normal.   3. The mitral valve is normal in structure. No evidence of mitral valve  regurgitation. No evidence of mitral stenosis.   4. The aortic valve is tricuspid. Aortic valve regurgitation is not  visualized. No aortic stenosis is present.   Comparison(s): Echocardiogram done 11/21/21 showed an EF of 55-60%.   Cardiac monitor results pending 10/23/2022  Coronary CTA on October 17, 2022: IMPRESSION: 1. Coronary calcium score of 14.8. This was 62nd percentile for age-, sex, and race-matched controls.   2. Normal coronary origin with left dominance.   3. Mild non-calcified plaque in the mid LAD (25-49%).   4. Mild calcified plaque in the distal LCX (25-49%).   5. Minimal non-calcified plaque (<25%) in the non-dominant RCA.  6.  No significant extracardiac findings.   RECOMMENDATIONS: 1. Mild non-obstructive CAD (25-49%). Consider non-atherosclerotic causes of chest pain. Consider preventive therapy and risk factor modification.  Echocardiogram on 12/19/2021:  1. Left ventricular ejection fraction, by estimation, is 55 to 60%. The  left ventricle has normal function. The left ventricle has no regional  wall motion abnormalities. Left ventricular diastolic parameters are  indeterminate. Normal global longitudinla   strain of -17.1%.   2. Right ventricular systolic function is normal. The right ventricular  size is normal. Tricuspid regurgitation signal is inadequate for assessing  PA pressure.   3. The mitral valve is grossly normal. Trivial mitral valve  regurgitation.   4. The aortic valve is tricuspid. Aortic valve regurgitation is not  visualized. No aortic stenosis is present. Aortic valve mean gradient  measures 5.0 mmHg.   5. The inferior vena cava is normal in size with greater than 50%  respiratory variability, suggesting right atrial pressure of 3 mmHg.   6. Aortic dilatation noted. There is borderline dilatation of the aortic  arch,  measuring 35 mm.   Comparison(s): Prior images unable to be directly viewed.  Recent Labs: 10/16/2022: BUN 22; Creatinine, Ser 1.04; Potassium 3.4; Sodium 135  Recent Lipid Panel No results found for: "CHOL", "TRIG", "HDL", "CHOLHDL", "VLDL", "LDLCALC", "LDLDIRECT"  Physical Exam:    VS:  BP (!) 120/90   Pulse 100   Ht 6\' 2"  (1.88 m)   Wt 219 lb 12.8 oz (99.7 kg)   SpO2 98%   BMI 28.22 kg/m  Wt Readings from Last 3 Encounters:  12/28/22 219 lb 12.8 oz (99.7 kg)  11/22/22 221 lb 9.6 oz (100.5 kg)  10/22/22 226 lb 12.8 oz (102.9 kg)     Previous Orthostatics: Lying: 148/88, 83 heart rate Sitting: 143/91, 91 heart rate Standing: 127/84, 93 heart rate Standing for 3 minutes: 136/91, 99 heart rate  GEN: Overweight, 52 year old male in no acute distress HEENT: Normal NECK: No JVD; No carotid bruits CARDIAC: S1/S2, RRR, no murmurs, rubs, gallops; 2+ pulses  RESPIRATORY:  Clear and diminished to auscultation without rales, wheezing or rhonchi  MUSCULOSKELETAL:  No edema; No deformity  SKIN: Warm and dry NEUROLOGIC:  Alert and oriented x 3 PSYCHIATRIC:  Normal affect   ASSESSMENT:    1. Coronary artery disease due to lipid rich plaque   2. Chest pain, unspecified type   3. Hyperlipidemia, unspecified hyperlipidemia type   4. Palpitations   5. Essential hypertension, benign   6. DOE (dyspnea on exertion)   7. Type 2 diabetes mellitus without complication, without long-term current use of insulin ([place])   8. Gastroesophageal reflux disease, unspecified whether esophagitis present   9. Seizure ([place])   10. Syncope, unspecified syncope type   11. Orthostatic dizziness   12. Aortic dilatation ([facility])    PLAN:    In order of problems listed above:  Mild nonobstructive CAD, chest pain, hyperlipidemia, palpitations Has been ongoing, similar quality since last office visit, atypical, random in occurrence, not associated with exertion. CCTA revealed mild nonobstructive CAD and  to consider nonatherosclerotic causes of chest pain and consider preventative therapy and risk factor modification. Did have low risk NST in 2021.  Reviewed past workup at [facility] ED unremarkable. Believe anxiety is contributing to his symptoms.  Continue aspirin, atorvastatin, and NG PRN. Will initiate low dose Imdur, stop Toprol XL and amlodipine. Will initiate Lopressor 25 mg BID for his occasional palpitations.  Discussed to avoid Viagra and NG use within 48 hours of one another, and he verbalized understanding. Heart healthy diet and regular cardiovascular exercise encouraged.  ED precautions discussed. Will recheck FLP and LFT per protocol. If max titration of medical management does not improve symptoms, consider cardiac cath.   Hypertension Blood pressure stable.  BP well-controlled at home. Stopping amlodipine and Toprol XL, reducing dose of chlorthalidone and valsartan, and starting low dose Imdur.  Discussed to monitor BP at home at least 2 hours after medications and sitting for 5-10 minutes. Heart healthy diet and regular cardiovascular exercise encouraged.   Shortness of breath with exertion Chronic x several years. TTE in February 2024 was overall reassuring.  Normal EF.  Etiology multifactorial, but does not sound cardiac related.  Heart healthy diet and regular cardiovascular exercise encouraged.  Continue current medication regimen.  Continue to follow with PCP. Will refer to Pulmonology.   Type 2 diabetes  PCP to manage. Heart , diabetic diet and regular cardiovascular exercise encouraged. Continue to follow with PCP.  GERD Continue pantoprazole 40 mg daily. Heart healthy diet and regular cardiovascular exercise encouraged.   Seizure?, hx of syncope, orthostatic dizziness Questionable past possible seizure witnessed by s/o.  Has not seen neurology. Previously reported two syncopal episodes at ED visit at [facility], witnessed by significant other, one brief ? Episode  since last visit.  Etiology unclear. Recent workup unremarkable. Orthostatics did reveal drop in BP on standing, but not considered orthostatic hypotension. Dizziness improved. Continue to follow with PCP.  Previously advised and recommended patient to avoid driving at  the current time for workup to be done and to avoid exertional activities at work.  ED precautions discussed.  Discussed conservative measures for orthostatic dizziness including wearing compression stockings, changing positions slowly, and staying adequately hydrated.  He verbalized understanding. Will refer to Neuro.   7. Aortic dilatation Borderline dilatation of aortic arch noted on echocardiogram in February 2023, measured 35 mm.  Recent echo showed normal aortic root. Heart healthy diet and regular cardiovascular exercise encouraged. Plan to update Echo in 11/2023.   8. Disposition: Follow-up with me or APP in 4-6 weeks or sooner if anything changes.     Medication Adjustments/Labs and Tests Ordered: Current medicines are reviewed at length with the patient today.  Concerns regarding medicines are outlined above.  Orders Placed This Encounter  Procedures   Ambulatory referral to Neurology   Ambulatory referral to Pulmonology   Meds ordered this encounter  Medications   chlorthalidone (HYGROTON) 25 MG tablet    Sig: Take 0.5 tablets (12.5 mg total) by mouth daily.    Dispense:  45 tablet    Refill:  1    12/28/22 Dose decrease   valsartan (DIOVAN) 320 MG tablet    Sig: Take 0.5 tablets (160 mg total) by mouth daily. TAKE ONE TABLET EVERY DAY. needs appointment FOR refills    Dispense:  45 tablet    Refill:  1    12/28/22 Dose decrease   metoprolol tartrate (LOPRESSOR) 25 MG tablet    Sig: Take 1 tablet (25 mg total) by mouth 2 (two) times daily.    Dispense:  180 tablet    Refill:  1    12/28/22 New Start   isosorbide mononitrate (IMDUR) 30 MG 24 hr tablet    Sig: Take 0.5 tablets (15 mg total) by mouth daily.     Dispense:  45 tablet    Refill:  1    12/28/22 New Start    Patient Instructions  Medication Instructions:  Your physician has recommended you make the following change in your medication:  STOP metoprolol succinate STOP amlodipine DECREASE chlorthalidone to 12.5 mg (1/2 tablet) by mouth once a day DECREASE valsartan to 160 mg (1/2 tablet) by mouth once a day START metoprolol tartrate 25 mg twice a day START isosorbide 15 mg once a day Continue all other medications as directed  Labwork: Labs are due BEFORE NEXT SCHEDULED APPOINTMENT FLP LFT  Testing/Procedures: none  Follow-Up:  Your physician recommends that you schedule a follow-up appointment in: 4-6 weeks  Any Other Special Instructions Will Be Listed Below (If Applicable).  You have been referred to Neurology and Pulmonology  If you need a refill on your cardiac medications before your next appointment, please call your pharmacy.    Signed, Finis Bud, NP  12/30/2022 5:29 PM    [place]

## 2022-12-28 NOTE — Patient Instructions (Addendum)
Medication Instructions:  Your physician has recommended you make the following change in your medication:  STOP metoprolol succinate STOP amlodipine DECREASE chlorthalidone to 12.5 mg (1/2 tablet) by mouth once a day DECREASE valsartan to 160 mg (1/2 tablet) by mouth once a day START metoprolol tartrate 25 mg twice a day START isosorbide 15 mg once a day Continue all other medications as directed  Labwork: Labs are due BEFORE NEXT SCHEDULED APPOINTMENT FLP LFT  Testing/Procedures: none  Follow-Up:  Your physician recommends that you schedule a follow-up appointment in: 4-6 weeks  Any Other Special Instructions Will Be Listed Below (If Applicable).  You have been referred to Neurology and Pulmonology  If you need a refill on your cardiac medications before your next appointment, please call your pharmacy.

## 2023-01-02 ENCOUNTER — Encounter: Payer: Self-pay | Admitting: Neurology

## 2023-01-16 ENCOUNTER — Encounter: Payer: Self-pay | Admitting: Internal Medicine

## 2023-01-16 ENCOUNTER — Ambulatory Visit (INDEPENDENT_AMBULATORY_CARE_PROVIDER_SITE_OTHER): Payer: 59 | Admitting: Internal Medicine

## 2023-01-16 VITALS — BP 144/92 | HR 91 | Temp 98.4°F | Ht 74.0 in | Wt 223.6 lb

## 2023-01-16 DIAGNOSIS — G4719 Other hypersomnia: Secondary | ICD-10-CM

## 2023-01-16 DIAGNOSIS — R0602 Shortness of breath: Secondary | ICD-10-CM | POA: Diagnosis not present

## 2023-01-16 NOTE — Progress Notes (Signed)
Gary Wyatt    [id number]    12/29/1970  Primary Care Physician:Vyas, Angelina Pih, MD  Referring Physician: [name], NP [address] [place],  [place] [address] Reason for Consultation: shortness of breath Date of Consultation: 01/16/2023  Chief complaint:   Chief Complaint  Patient presents with   Consult    Sob (3 years), pt states he's sob during rest and while exerting himself.      HPI:  Gary Wyatt is a 52 y.o. man who presents for new patient evaluation of shortness of breath.   Symptoms started about 3 years ago, progressively worsening. Walking, sitting standing. He is having dyspnea with ADLS - during intercourse had dyspnea that made him need to stop.  He does have a cough which seems unrelated to the shortness of breath. Never had pneumonia or bronchitis.   Has had covid 3 times over the last couple of years. Never hospitalized, treated with paxlovid.  Sees Dr. [name] in cardiology for hypertension and shortness of breath, chest pain. He has difficulty to control high blood pressure and is on 4 different medications. 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   Has tightness in his chest, difficulty getting a deep breath in with the dyspnea. Has no trouble exhaling. Slowing down and stopping [place]  Used to run cross country and track and was running until about ten years ago due to neuropathy in feet.   He has never taken nitroglycerin for these symptoms because he is scared it will drop his blood pressure.   He has narcolepsy managed by [facility] - Dr Sherril Croon.  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 Sleeps in the morning for a couple hours, then wakes up to spend time with his partner, then sleeps again In the afternoon.   Social history:  Occupation: works third shift at [employer] Exposures:  lives at home with girlfriend, yorkie Smoking history: never smoker, passive smoker exposure with girlfriend, at  work.   Social History   Occupational History   Not on file  Tobacco Use   Smoking status: Never    Passive exposure: Never   Smokeless tobacco: Current    Types: Snuff  Vaping Use   Vaping Use: Never used  Substance and Sexual Activity   Alcohol use: Yes    Comment: occas   Drug use: No   Sexual activity: Not on file    Relevant family history:  Family History  Problem Relation Age of Onset   Hypertension Mother    Diabetes Father    CAD Father    Prostate cancer Father    Colon cancer Neg Hx     Past Medical History:  Diagnosis Date   Anxiety    Depression    Diabetes mellitus    type 2   Hypertension    Insomnia    Kidney stone    Narcolepsy    TIA (transient ischemic attack)     Past Surgical History:  Procedure Laterality Date   BIOPSY  07/25/2021   Procedure: BIOPSY;  Surgeon: Lanelle Bal, DO;  [facility];  Service: Endoscopy;;   ESOPHAGOGASTRODUODENOSCOPY (EGD) WITH PROPOFOL N/A 07/25/2021   Procedure: ESOPHAGOGASTRODUODENOSCOPY (EGD) WITH PROPOFOL;  Surgeon: Lanelle Bal, DO;  [facility];  Service: Endoscopy;  Laterality: N/A;  7:30am   NO PAST SURGERIES       Physical Exam: Blood  pressure (!) 144/92, pulse 91, temperature 98.4 F (36.9 C), height 6\' 2"  (1.88 m), weight 223 lb 9.6 oz (101.4 kg), SpO2 98 %. Gen:      No acute distress ENT:  mallampati 2, no nasal polyps, mucus membranes moist Lungs:    No increased respiratory effort, symmetric chest wall excursion, clear to auscultation bilaterally, no wheezes or crackles CV:         Regular rate and rhythm; no murmurs, rubs, or gallops.  No pedal edema Abd:      + bowel sounds; soft, non-tender; no distension MSK: no acute synovitis of DIP or PIP joints, no mechanics hands.  Skin:      Warm and dry; no rashes Neuro: normal speech, no focal facial asymmetry Psych: alert and oriented x3, normal mood and affect   Data Reviewed/Medical Decision  Making:  Independent interpretation of tests: Imaging:  Review of patient's ct coronary calcium scan images revealed no acute process in the visualized lung windows. The patient's images have been independently reviewed by me.    PFTs:    Labs:  Lab Results  Component Value Date   NA 135 10/16/2022   K 3.4 (L) 10/16/2022   CO2 27 10/16/2022   GLUCOSE 241 (H) 10/16/2022   BUN 22 (H) 10/16/2022   CREATININE 1.04 10/16/2022   CALCIUM 9.2 10/16/2022   GFRNONAA >60 10/16/2022   Lab Results  Component Value Date   WBC 9.9 11/04/2021   HGB 16.3 11/04/2021   HCT 46.3 11/04/2021   MCV 82.5 11/04/2021   PLT 255 11/04/2021     Immunization status:   There is no immunization history on file for this patient.   I reviewed prior external note(s) from cardiology  I reviewed the result(s) of the labs and imaging as noted above.   I have ordered sleep study, pft   Assessment:  Shortness of breath Excessive daytime sleepiness Hypertension Narcolepsy  Plan/Recommendations:  Will obtain PFTS for dyspnea. Does not seem classic for obstructive lung disease. Would defer trial of inhaler therapy for now.  Could have shift worker syndrome causing somnolence, but Needs screening for sleep apnea given refractory hypertension.  Will proceed with home sleep study to start- instructed him to sleep for an uninterrupted period of time for this.  Continue meds for narcolepsy  We discussed disease management and progression at length today.   Return to Care: Return in about 6 weeks (around 02/27/2023).  Durel Salts, MD Pulmonary and Critical Care Medicine [facility] Office:[phone]  CC: [name], NP

## 2023-01-16 NOTE — Patient Instructions (Addendum)
Please schedule follow up scheduled with myself in 6 weeks.  If my schedule is not open yet, we will contact you with a reminder closer to that time. Please call [phone] if you haven't heard from [place] a month before.   Before your next visit I would like you to have: Home Sleep study - we will call you to schedule this PFT - 1 hour (can do at [facility] or [facility].)  Follow up with me after these tests  Stop using chewing tobacco.

## 2023-01-24 ENCOUNTER — Other Ambulatory Visit: Payer: Self-pay | Admitting: Cardiology

## 2023-01-30 ENCOUNTER — Emergency Department (HOSPITAL_COMMUNITY): Payer: 59

## 2023-01-30 ENCOUNTER — Emergency Department (HOSPITAL_COMMUNITY)
Admission: EM | Admit: 2023-01-30 | Discharge: 2023-01-30 | Disposition: A | Discharge status: Home / Self Care | Payer: 59 | Attending: Emergency Medicine | Admitting: Emergency Medicine

## 2023-01-30 ENCOUNTER — Other Ambulatory Visit: Payer: Self-pay

## 2023-01-30 ENCOUNTER — Encounter (HOSPITAL_COMMUNITY): Payer: Self-pay

## 2023-01-30 DIAGNOSIS — R079 Chest pain, unspecified: Secondary | ICD-10-CM

## 2023-01-30 DIAGNOSIS — I10 Essential (primary) hypertension: Secondary | ICD-10-CM | POA: Insufficient documentation

## 2023-01-30 DIAGNOSIS — E119 Type 2 diabetes mellitus without complications: Secondary | ICD-10-CM | POA: Insufficient documentation

## 2023-01-30 DIAGNOSIS — Z79899 Other long term (current) drug therapy: Secondary | ICD-10-CM | POA: Diagnosis not present

## 2023-01-30 DIAGNOSIS — Z8673 Personal history of transient ischemic attack (TIA), and cerebral infarction without residual deficits: Secondary | ICD-10-CM | POA: Insufficient documentation

## 2023-01-30 DIAGNOSIS — Z7982 Long term (current) use of aspirin: Secondary | ICD-10-CM | POA: Insufficient documentation

## 2023-01-30 LAB — COMPREHENSIVE METABOLIC PANEL
ALT: 30 U/L (ref 0–44)
AST: 20 U/L (ref 15–41)
Albumin: 3.5 g/dL (ref 3.5–5.0)
Alkaline Phosphatase: 56 U/L (ref 38–126)
Anion gap: 9 (ref 5–15)
BUN: 12 mg/dL (ref 6–20)
CO2: 26 mmol/L (ref 22–32)
Calcium: 8.7 mg/dL — ABNORMAL LOW (ref 8.9–10.3)
Chloride: 98 mmol/L (ref 98–111)
Creatinine, Ser: 1.21 mg/dL (ref 0.61–1.24)
GFR, Estimated: >60 mL/min (ref >60)
Glucose, Bld: 213 mg/dL — ABNORMAL HIGH (ref 70–99)
Potassium: 3.7 mmol/L (ref 3.5–5.1)
Sodium: 133 mmol/L — ABNORMAL LOW (ref 135–145)
Total Bilirubin: 0.7 mg/dL (ref 0.3–1.2)
Total Protein: 6.2 g/dL — ABNORMAL LOW (ref 6.5–8.1)

## 2023-01-30 LAB — CBC WITH DIFFERENTIAL/PLATELET
Abs Immature Granulocytes: 0.02 10*3/uL (ref 0.00–0.07)
Basophils Absolute: 0 10*3/uL (ref 0.0–0.1)
Basophils Relative: 1 %
Eosinophils Absolute: 0.2 10*3/uL (ref 0.0–0.5)
Eosinophils Relative: 3 %
HCT: 45.2 % (ref 39.0–52.0)
Hemoglobin: 14.8 g/dL (ref 13.0–17.0)
Immature Granulocytes: 0 %
Lymphocytes Relative: 20 %
Lymphs Abs: 1.2 10*3/uL (ref 0.7–4.0)
MCH: 27.3 pg (ref 26.0–34.0)
MCHC: 32.7 g/dL (ref 30.0–36.0)
MCV: 83.4 fL (ref 80.0–100.0)
Monocytes Absolute: 0.5 10*3/uL (ref 0.1–1.0)
Monocytes Relative: 8 %
Neutro Abs: 4.1 10*3/uL (ref 1.7–7.7)
Neutrophils Relative %: 68 %
Platelets: 205 10*3/uL (ref 150–400)
RBC: 5.42 MIL/uL (ref 4.22–5.81)
RDW: 13.5 % (ref 11.5–15.5)
WBC: 6 10*3/uL (ref 4.0–10.5)
nRBC: 0 % (ref 0.0–0.2)

## 2023-01-30 LAB — TROPONIN I (HIGH SENSITIVITY)
Troponin I (High Sensitivity): 4 ng/L (ref <18)
Troponin I (High Sensitivity): 4 ng/L (ref <18)

## 2023-01-30 LAB — CBG MONITORING, ED: Glucose-Capillary: 221 mg/dL — ABNORMAL HIGH (ref 70–99)

## 2023-01-30 MED ORDER — NITROGLYCERIN IN D5W 200-5 MCG/ML-% IV SOLN
0.0000 ug/min | INTRAVENOUS | Status: DC
  Administered 2023-01-30: 5 ug/min via INTRAVENOUS
  Filled 2023-01-30: qty 250

## 2023-01-30 MED ORDER — MAALOX MAX 400-400-40 MG/5ML PO SUSP: 5.0000 mL | Freq: Four times a day (QID) | ORAL | 0 refills | Status: DC | PRN

## 2023-01-30 MED ORDER — MORPHINE SULFATE (PF) 4 MG/ML IV SOLN
4.0000 mg | Freq: Once | INTRAVENOUS | Status: AC
  Administered 2023-01-30: 4 mg via INTRAVENOUS
  Filled 2023-01-30: qty 1

## 2023-01-30 MED ORDER — METOPROLOL TARTRATE 25 MG PO TABS
25.0000 mg | ORAL_TABLET | Freq: Once | ORAL | Status: AC
  Administered 2023-01-30: 25 mg via ORAL
  Filled 2023-01-30: qty 1

## 2023-01-30 MED ORDER — HYDROCODONE-ACETAMINOPHEN 5-325 MG PO TABS: 1.0000 | ORAL_TABLET | Freq: Three times a day (TID) | ORAL | 0 refills | Status: DC | PRN

## 2023-01-30 MED ORDER — IOHEXOL 350 MG/ML SOLN
100.0000 mL | Freq: Once | INTRAVENOUS | Status: AC | PRN
  Administered 2023-01-30: 100 mL via INTRAVENOUS

## 2023-01-30 MED ORDER — ACETAMINOPHEN 500 MG PO TABS
1000.0000 mg | ORAL_TABLET | Freq: Once | ORAL | Status: AC
  Administered 2023-01-30: 1000 mg via ORAL
  Filled 2023-01-30: qty 2

## 2023-01-30 NOTE — ED Notes (Signed)
Pt given sandwich and drink for PO challenge  

## 2023-01-30 NOTE — Discharge Instructions (Signed)
You have been seen and discharged from the emergency department.  You have had a full cardiac workup that was reassuring and normal for you.  Cardiology consulted.  We believe your chest pain is noncardiac and we will treat your pain.  It is very important that you follow-up with cardiology as an outpatient.  Take pain medicine as directed.  Do not mix this medication with alcohol or other sedating medications. Do not drive or do heavy physical activity until you know how this medication affects you.  It may cause drowsiness.  Follow-up with your primary provider for further evaluation and further care. Take home medications as prescribed. If you have any worsening symptoms or further concerns for your health please return to an emergency department for further evaluation.

## 2023-01-30 NOTE — Consult Note (Addendum)
Cardiology Consultation   Patient ID: Gary Wyatt MRN: [id number]; DOB: 06-Feb-1971  Admit date: 01/30/2023 Date of Consult: 01/30/2023  PCP:  [name], MD   [facility] Providers Cardiologist:  [name], MD      Patient Profile:   Gary Wyatt is a 52 y.o. male with a hx of mild nonobstructive CAD, history of TIA, HTN, tyope 2 DM, aortic dilation, GERD, history of chest pain who is being seen 01/30/2023 for the evaluation of chest pain at the request of Dr. Wilkie Aye.   History of Present Illness:   Gary Wyatt is a 52 year old male with above medical history who is followed by Dr. [name].   Patient had a nuclear stress test in 2021 that was a low risk study. He established care with Dr. [name] in 2022 for BP management. Later was seen in 08/2021 for evaluation of chest pain. Patient had not been taking his amlodipine as prescribed, and his dose was increased. Later had an echocardiogram on 11/21/21 that showed EF 55-60%, no regional wall motion abnormalities, normal RV systolic function, borderline dilation of the aortic arch measuring 35 mm.   Patient was seen in 09/2022, and he complained of chest pain, shortness of breath at that time. He reported that he had been having episodes of chest pain for about a year. He underwent coronary CT on 10/17/22 that showed mild non-calcified plaque in the mid LAD, mild calcified plaque in the distal Lcx, and minimal non-calcified plaque in the RCA. Coronary calcium score was 14.8 (62nd percentile). He was seen in the ED on 10/18/22 for chest pain, and troponins were negative. He was discharged with cardiology follow up. He had an echocardiogram on 11/08/22 that showed EF 60-65%, no regional wall motion abnormalities, grade I diastolic dysfunction, normal RV systolic function. He had a follow up appointment on 11/22/22, and he continued to complain of left sided chest pain that was a 10/10 in intensity. He was started on  metoprolol succinate and SL nitro PRN.   Patient was last seen by cardiology on 12/28/22. At that appointment, patient reported that his chest pain and shortness of breath were unchanged. Also complained of having a brief syncopal episode. We stopped amlodipine and toprol-XL, and instead started imdur and lopressor 25 mg BID. Since then, patient has ben on ASA 81 mg daily, lipitor 20 mg daily, imdur 15 mg daily, lopressor 25 mg BID, sl nitro PRN.    Patient presented to the ED on 4/24 complaining of chest pain that radiated into his left arm and shoulder blades. Patient had tried to take 2 SL nitro tablets, but pain did not improve. Initial vital signs showed BP 149/94, HR 72 BPM, oxygen saturation 97% on room air. Labs in the ED showed Na 133, K 3.7, creatinine 1.21, WBC 6.0, hemoglobin 14.8, platelets 205. hsTn 4>4.   CTA chest/abd/pel was negative for aortic dissection, aneurysm, or other acute vascular pathology.   On interview, patient reports that he was at work this morning (around 3 AM) when he developed substernal chest pain that radiated down his left arm, between his shoulder blades, and up his neck. Also felt a bit lightheaded and short of breath. He sat down to rest, but the pain continued to get worse. 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 He came to the ED and was given morphine and started  on IV nitro. Pain improved slightly, but he continues to have pain in the ED. Patient has been having intermittent episodes of chest pain for the past 2 years. Today's episode of chest pain feels the same as his chronic chest pain. He has also had some shortness of breath for the past 6 months and is being followed by pulmonology.    Past Medical History:  Diagnosis Date   Anxiety    Depression    Diabetes mellitus    type 2   Hypertension    Insomnia    Kidney stone    Narcolepsy    TIA (transient ischemic  attack)     Past Surgical History:  Procedure Laterality Date   BIOPSY  07/25/2021   Procedure: BIOPSY;  Surgeon: Lanelle Bal, DO;  Location: [facility];  Service: Endoscopy;;   ESOPHAGOGASTRODUODENOSCOPY (EGD) WITH PROPOFOL N/A 07/25/2021   Procedure: ESOPHAGOGASTRODUODENOSCOPY (EGD) WITH PROPOFOL;  Surgeon: Lanelle Bal, DO;  Location: [facility];  Service: Endoscopy;  Laterality: N/A;  7:30am   NO PAST SURGERIES       Home Medications:  Prior to Admission medications   Medication Sig Start Date End Date Taking? Authorizing Provider  ALPRAZolam Prudy Feeler) 0.5 MG tablet Take 0.5 mg by mouth 2 (two) times daily as needed for anxiety. 12/05/20  Yes [provider]  aspirin 81 MG EC tablet Take 81 mg by mouth daily. Swallow whole.   Yes [provider]  atorvastatin (LIPITOR) 20 MG tablet Take 1 tablet (20 mg total) by mouth daily. 10/22/22  Yes [name], NP  chlorthalidone (HYGROTON) 25 MG tablet Take 0.5 tablets (12.5 mg total) by mouth daily. 12/28/22  Yes [name], NP  hydrOXYzine (ATARAX) 25 MG tablet Take 25-50 mg by mouth at bedtime as needed (sleep). 12/25/22  Yes [provider]  isosorbide mononitrate (IMDUR) 30 MG 24 hr tablet Take 0.5 tablets (15 mg total) by mouth daily. 12/28/22 06/26/23 Yes [name], NP  methylphenidate (RITALIN) 20 MG tablet Take 20 mg by mouth 2 (two) times daily.    Yes [provider]  metoprolol tartrate (LOPRESSOR) 25 MG tablet Take 1 tablet (25 mg total) by mouth 2 (two) times daily. 12/28/22 06/26/23 Yes [name], NP  nitroGLYCERIN (NITROSTAT) 0.4 MG SL tablet Place 1 tablet (0.4 mg total) under the tongue every 5 (five) minutes as needed. 11/22/22 02/20/23 Yes [name], NP  OZEMPIC, 0.25 OR 0.5 MG/DOSE, 2 MG/3ML SOPN Inject 0.25 mg into the skin every Friday. 01/18/23  Yes [provider]  pantoprazole (PROTONIX) 40 MG tablet Take 1 tablet (40 mg total) by mouth daily.  09/20/22  Yes [name], NP  sildenafil (VIAGRA) 100 MG tablet Take 100 mg by mouth daily as needed for erectile dysfunction. 01/10/21  Yes [provider]  TRESIBA FLEXTOUCH 200 UNIT/ML FlexTouch Pen Inject 32 Units into the skin daily at 6 (six) AM. 11/28/20  Yes [provider]  valsartan (DIOVAN) 320 MG tablet Take 0.5 tablets (160 mg total) by mouth daily. TAKE ONE TABLET EVERY DAY. needs appointment FOR refills 12/28/22  Yes [name], NP    Inpatient Medications: Scheduled Meds:  Continuous Infusions:  nitroGLYCERIN 20 mcg/min (01/30/23 1202)   PRN Meds:   Allergies:    Allergies  Allergen Reactions   Demerol [Meperidine] Itching and Other (See Comments)    irritation    Social History:   Social History   Socioeconomic History   Marital status: Divorced  Spouse name: Not on file   Number of children: Not on file   Years of education: Not on file   Highest education level: Not on file  Occupational History   Not on file  Tobacco Use   Smoking status: Never    Passive exposure: Never   Smokeless tobacco: Current    Types: Snuff  Vaping Use   Vaping Use: Never used  Substance and Sexual Activity   Alcohol use: Yes    Comment: occas   Drug use: No   Sexual activity: Not on file  Other Topics Concern   Not on file  Social History Narrative   Not on file   Social Determinants of Health   Financial Resource Strain: Not on file  Food Insecurity: Not on file  Transportation Needs: Not on file  Physical Activity: Not on file  Stress: Not on file  Social Connections: Not on file  Intimate Partner Violence: Not on file    Family History:    Family History  Problem Relation Age of Onset   Hypertension Mother    Diabetes Father    CAD Father    Prostate cancer Father    Colon cancer Neg Hx      ROS:  Please see the history of present illness.   All other ROS reviewed and negative.     Physical Exam/Data:   Vitals:    01/30/23 1100 01/30/23 1115 01/30/23 1130 01/30/23 1145  BP: (!) 161/91 137/88 (!) 135/92 (!) 136/94  Pulse: 69 66 69 72  Resp:    16  Temp:      TempSrc:      SpO2: 94% 96% 92% (!) 89%  Weight:      Height:       No intake or output data in the 24 hours ending 01/30/23 1237    01/30/2023    7:01 AM 01/16/2023    8:45 AM 12/28/2022   10:13 AM  Last 3 Weights  Weight (lbs) 225 lb 223 lb 9.6 oz 219 lb 12.8 oz  Weight (kg) 102.059 kg 101.424 kg 99.701 kg     Body mass index is 30.52 kg/m.  General:  Well nourished, well developed, in no acute distress. Laying in the bed with head elevated  HEENT: normal Neck: no JVD Vascular: Radial pulses 2+ bilaterally Cardiac:  normal S1, S2; RRR; no murmur. Chest wall is tender to palpation.  Lungs:  clear to auscultation bilaterally, no wheezing, rhonchi or rales. Normal work of breathing on room air  Abd: soft, nontender Ext: no edema in BLE Musculoskeletal:  No deformities, BUE and BLE strength normal and equal. Left shoulder and bicep are tender to palpation.  Skin: warm and dry  Neuro:  CNs 2-12 intact, no focal abnormalities noted Psych:  Normal affect   EKG:  The EKG was personally reviewed and demonstrates:  NSR, HR 66 BPM  Telemetry:  Telemetry was personally reviewed and demonstrates:  NSR   Relevant CV Studies:  CT coronary 10/17/22  IMPRESSION: 1. Coronary calcium score of 14.8. This was 62nd percentile for age-, sex, and race-matched controls.   2. Normal coronary origin with left dominance.   3. Mild non-calcified plaque in the mid LAD (25-49%).   4. Mild calcified plaque in the distal LCX (25-49%).   5. Minimal non-calcified plaque (<25%) in the non-dominant RCA.   RECOMMENDATIONS: 1. Mild non-obstructive CAD (25-49%). Consider non-atherosclerotic causes of chest pain. Consider preventive therapy and risk factor modification.  Echocardiogram  11/08/22 1. Left ventricular ejection fraction, by estimation, is 60 to  65%. The  left ventricle has normal function. The left ventricle has no regional  wall motion abnormalities. Left ventricular diastolic parameters are  consistent with Grade I diastolic  dysfunction (impaired relaxation). The average left ventricular global  longitudinal strain is -18.6 %. The global longitudinal strain is normal.   2. Right ventricular systolic function is normal. The right ventricular  size is normal.   3. The mitral valve is normal in structure. No evidence of mitral valve  regurgitation. No evidence of mitral stenosis.   4. The aortic valve is tricuspid. Aortic valve regurgitation is not  visualized. No aortic stenosis is present.   Comparison(s): Echocardiogram done 11/21/21 showed an EF of 55-60%.   Laboratory Data:  High Sensitivity Troponin:   Recent Labs  Lab 01/30/23 0653 01/30/23 0917  TROPONINIHS 4 4     Chemistry Recent Labs  Lab 01/30/23 0653  NA 133*  K 3.7  CL 98  CO2 26  GLUCOSE 213*  BUN 12  CREATININE 1.21  CALCIUM 8.7*  GFRNONAA >60  ANIONGAP 9    Recent Labs  Lab 01/30/23 0653  PROT 6.2*  ALBUMIN 3.5  AST 20  ALT 30  ALKPHOS 56  BILITOT 0.7   Lipids No results for input(s): "CHOL", "TRIG", "HDL", "LABVLDL", "LDLCALC", "CHOLHDL" in the last 168 hours.  Hematology Recent Labs  Lab 01/30/23 0653  WBC 6.0  RBC 5.42  HGB 14.8  HCT 45.2  MCV 83.4  MCH 27.3  MCHC 32.7  RDW 13.5  PLT 205   Thyroid No results for input(s): "TSH", "FREET4" in the last 168 hours.  BNPNo results for input(s): "BNP", "PROBNP" in the last 168 hours.  DDimer No results for input(s): "DDIMER" in the last 168 hours.   Radiology/Studies:  CT Angio Chest/Abd/Pel for Dissection W and/or W/WO  Result Date: 01/30/2023 CLINICAL DATA:  Aortic aneurysm suspected.  Midsternal chest pain. EXAM: CT ANGIOGRAPHY CHEST, ABDOMEN AND PELVIS TECHNIQUE: Non-contrast CT of the chest was initially obtained. Multidetector CT imaging through the chest, abdomen and  pelvis was performed using the standard protocol during bolus administration of intravenous contrast. Multiplanar reconstructed images and MIPs were obtained and reviewed to evaluate the vascular anatomy. RADIATION DOSE REDUCTION: This exam was performed according to the departmental dose-optimization program which includes automated exposure control, adjustment of the mA and/or kV according to patient size and/or use of iterative reconstruction technique. CONTRAST:  OMNIPAQUE IOHEXOL 350 MG/ML SOLN COMPARISON:  Prior CT scan of the heart 10/17/2022; prior CT scan of the abdomen and pelvis 12/25/2021 FINDINGS: CTA CHEST FINDINGS Cardiovascular: 2 vessel arch anatomy. The right brachiocephalic and left common carotid artery share a common origin. No evidence of dissection or acute intramural hematoma. No evidence of aneurysm. Trace atherosclerotic calcification along the aorta. Adequate enhancement of the main and central pulmonary arteries. No evidence of large volume PE. The heart is normal in size. No pericardial effusion. Mediastinum/Nodes: Unremarkable CT appearance of the thyroid gland. No suspicious mediastinal or hilar adenopathy. No soft tissue mediastinal mass. The thoracic esophagus is unremarkable. Lungs/Pleura: Lungs are clear. No pleural effusion or pneumothorax. Musculoskeletal: No chest wall abnormality. No acute or significant osseous findings. Review of the MIP images confirms the above findings. CTA ABDOMEN AND PELVIS FINDINGS VASCULAR Aorta: Normal caliber aorta without aneurysm, dissection, vasculitis or significant stenosis. Celiac: Patent without evidence of aneurysm, dissection, vasculitis or significant stenosis. The right hepatic artery is replaced  to the SMA. SMA: Patent without evidence of aneurysm, dissection, vasculitis or significant stenosis. Replaced right hepatic artery. Renals: Multiple renal arteries bilaterally. Three renal arteries are identified on the right and 3 on the  left. No changes of fibromuscular dysplasia, aneurysm or dissection. IMA: Patent without evidence of aneurysm, dissection, vasculitis or significant stenosis. Inflow: Patent without evidence of aneurysm, dissection, vasculitis or significant stenosis. Veins: No obvious venous abnormality within the limitations of this arterial phase study. Review of the MIP images confirms the above findings. NON-VASCULAR Hepatobiliary: No focal liver abnormality is seen. No gallstones, gallbladder wall thickening, or biliary dilatation. Pancreas: Unremarkable. No pancreatic ductal dilatation or surrounding inflammatory changes. Spleen: Normal in size without focal abnormality. Adrenals/Urinary Tract: Adrenal glands are unremarkable. Kidneys are normal, without renal calculi, focal lesion, or hydronephrosis. Bladder is unremarkable. Stomach/Bowel: Stomach is within normal limits. Appendix appears normal. No evidence of bowel wall thickening, distention, or inflammatory changes. Lymphatic: No suspicious lymphadenopathy. Reproductive: Prostate is unremarkable. Other: Small fat containing right inguinal hernia. No evidence of ascites. Musculoskeletal: No acute or significant osseous findings. Review of the MIP images confirms the above findings. IMPRESSION: 1. Negative for aortic dissection, aneurysm or other acute vascular pathology. 2. Mild scattered atherosclerotic plaque. Aortic Atherosclerosis (ICD10-I70.0). 3. Replaced right hepatic artery. 4. Multiple renal arteries bilaterally. 5. Small fat containing right inguinal hernia. Electronically Signed   By: Malachy Moan M.D.   On: 01/30/2023 10:44   DG Chest Port 1 View  Result Date: 01/30/2023 CLINICAL DATA:  Chest pain, history diabetes mellitus and hypertension EXAM: PORTABLE CHEST 1 VIEW COMPARISON:  Portable exam 0742 hours compared to 10/18/2022 FINDINGS: Upper normal size of cardiac silhouette. Slight pulmonary vascular congestion. Mediastinal contours normal. Lungs  clear. No pulmonary infiltrate, pleural effusion, pneumothorax, or acute osseous findings. IMPRESSION: No acute abnormalities. Electronically Signed   By: Ulyses Southward M.D.   On: 01/30/2023 07:48     Assessment and Plan:   Noncardiac Chest Pain  - Patient presented to the ED today complaining of chest pain that radiated into his left shoulder, between his shoulder blades, and up his neck. He has been having similar episodes of chest pain for the past 2 years. Today's episode of chest pain is the same as his chronic chest pain. Was not relieved with SL nitro or nitro drip  - hsTn 4>4. EKG without ischemic changes  - Patient had a coronary CT on 10/17/22 to investigate the same symptoms- ct showed mild, nonobstructive disease  - Echocardiogram from 11/08/22 showed EF 60-65%, no regional wall motion abnormalities, normal RV systolic function  - Chest pain is reproducible on palpation. Suspect this is musculoskeletal pain.  - As recent coronary CT showed mild nonobstructive disease, hstn are negative x2, EKG is nonischemic, no plans for repeat ischemic evaluation at this time.  - Continue imdur 15 mg daily, lopressor 25 mg BID - He has an appointment with our office on 5/10    Risk Assessment/Risk Scores:         For questions or updates, please contact [facility] Please consult [email or web address] for contact info under    Signed, Jonita Albee, PA-C  01/30/2023 12:37 PM  I have personally seen and examined this patient. I agree with the assessment and plan as outlined above. 52 yo male with HTN, GERD, DM, prior TIA, mild CAD and chronic chest pain who presented to the ED with c/o chest pain. Pain is center of his chest. No associated dyspnea. Pain has  been present for many hours. Similar to the pain he has been getting for two years. Not associated with meals. Troponin negative. EKG without ischemic changes Chest CTA without evidence of PE or aortic dissection. As above,  coronary CTA January 2024 with mild CAD Exam: NAD, Lungs clear Abd soft, CV:RRR no murmurs. Ext: no edema  Plan: His pain is not felt to be cardiac related. It seems to be musculoskeletal. No further cardiac workup is indicated.  Verne Carrow, MD, [facility] 01/30/2023 3:29 PM

## 2023-01-30 NOTE — ED Provider Notes (Addendum)
[place] EMERGENCY DEPARTMENT AT [facility] Provider Note   CSN: [id number] Arrival date & time: 01/30/23  4098     History  Chief Complaint  Patient presents with   Chest Pain    Arrived via ems from WORK with left sided cp radiating down left arm and into back has significant heart hx took 2 sl nito himself with no relief ems gave 324 asa BGL 250 VS 160/91 78 18    Gary Wyatt is a 52 y.o. male.  HPI   52 year old male with past medical history of HTN, DM, TIA, episodes of chest pain who follows with Dr. [name] for cardiology presents emergency department with ongoing chest pain.  Patient states at 3 AM this morning while he was at work he developed midsternal chest pain that he describes as sharp radiating to his left upper extremity.  He has had pain like this before.  He has been evaluated by cardiology, he recently had a coronary artery CT scan 2 months ago and is being treated conservatively.  He has nitro prescribed to him.  Initially the pain was 10/10.  After 2 nitroglycerin the pain was 8/10.  Currently the pain is 8/10.  Denies any ongoing shortness of breath or swelling of his lower extremities.  No other recent acute illness.  Home Medications Prior to Admission medications   Medication Sig Start Date End Date Taking? Authorizing Provider  ALPRAZolam Prudy Feeler) 0.5 MG tablet Take 0.5 mg by mouth 2 (two) times daily as needed for anxiety. 12/05/20   [provider]  aspirin 81 MG EC tablet Take 81 mg by mouth daily. Swallow whole.    [provider]  atorvastatin (LIPITOR) 20 MG tablet Take 1 tablet (20 mg total) by mouth daily. 10/22/22   [name], NP  chlorthalidone (HYGROTON) 25 MG tablet Take 0.5 tablets (12.5 mg total) by mouth daily. 12/28/22   [name], NP  isosorbide mononitrate (IMDUR) 30 MG 24 hr tablet Take 0.5 tablets (15 mg total) by mouth daily. 12/28/22 06/26/23  [name], NP  methylphenidate (RITALIN) 20 MG  tablet Take 20 mg by mouth 2 (two) times daily.     [provider]  metoprolol tartrate (LOPRESSOR) 25 MG tablet Take 1 tablet (25 mg total) by mouth 2 (two) times daily. 12/28/22 06/26/23  [name], NP  nitroGLYCERIN (NITROSTAT) 0.4 MG SL tablet Place 1 tablet (0.4 mg total) under the tongue every 5 (five) minutes as needed. 11/22/22 02/20/23  [name], NP  pantoprazole (PROTONIX) 40 MG tablet Take 1 tablet (40 mg total) by mouth daily. 09/20/22   [name], NP  sildenafil (VIAGRA) 100 MG tablet Take 100 mg by mouth daily as needed for erectile dysfunction. 01/10/21   [provider]  TRESIBA FLEXTOUCH 200 UNIT/ML FlexTouch Pen Inject 32 Units into the skin daily at 6 (six) AM. 11/28/20   [provider]  valsartan (DIOVAN) 320 MG tablet Take 0.5 tablets (160 mg total) by mouth daily. TAKE ONE TABLET EVERY DAY. needs appointment FOR refills 12/28/22   [name], NP      Allergies    Demerol [meperidine]    Review of Systems   Review of Systems  Constitutional:  Negative for fever.  Respiratory:  Negative for shortness of breath.   Cardiovascular:  Positive for chest pain. Negative for palpitations and leg swelling.  Gastrointestinal:  Negative for abdominal pain, diarrhea and vomiting.  Musculoskeletal:        LUE  pain  Skin:  Negative for rash.  Neurological:  Negative for headaches.    Physical Exam Updated Vital Signs BP (!) 147/92   Pulse 70   Temp (!) 97.5 F (36.4 C)   Resp 20   Ht 6' (1.829 m)   Wt 102.1 kg   SpO2 94%   BMI 30.52 kg/m  Physical Exam Vitals and nursing note reviewed.  Constitutional:      General: He is not in acute distress.    Appearance: Normal appearance.  HENT:     Head: Normocephalic.     Mouth/Throat:     Mouth: Mucous membranes are moist.  Cardiovascular:     Rate and Rhythm: Normal rate.  Pulmonary:     Effort: Pulmonary effort is normal. No respiratory distress.     Breath sounds: No  rales.  Chest:     Chest wall: No tenderness.  Abdominal:     Palpations: Abdomen is soft.     Tenderness: There is no abdominal tenderness.  Musculoskeletal:     Right lower leg: No edema.     Left lower leg: No edema.  Skin:    General: Skin is warm.  Neurological:     Mental Status: He is alert and oriented to person, place, and time. Mental status is at baseline.  Psychiatric:        Mood and Affect: Mood normal.     ED Results / Procedures / Treatments   Labs (all labs ordered are listed, but only abnormal results are displayed) Labs Reviewed  CBG MONITORING, ED - Abnormal; Notable for the following components:      Result Value   Glucose-Capillary 221 (*)    All other components within normal limits  CBC WITH DIFFERENTIAL/PLATELET  COMPREHENSIVE METABOLIC PANEL  TROPONIN I (HIGH SENSITIVITY)    EKG EKG Interpretation  Date/Time:  Wednesday January 30 2023 07:36:43 EDT Ventricular Rate:  66 PR Interval:  151 QRS Duration: 80 QT Interval:  391 QTC Calculation: 410 R Axis:   21 Text Interpretation: Sinus rhythm Confirmed by Coralee Pesa [phone]) on 01/30/2023 8:13:50 AM  Radiology DG Chest Port 1 View  Result Date: 01/30/2023 CLINICAL DATA:  Chest pain, history diabetes mellitus and hypertension EXAM: PORTABLE CHEST 1 VIEW COMPARISON:  Portable exam 0742 hours compared to 10/18/2022 FINDINGS: Upper normal size of cardiac silhouette. Slight pulmonary vascular congestion. Mediastinal contours normal. Lungs clear. No pulmonary infiltrate, pleural effusion, pneumothorax, or acute osseous findings. IMPRESSION: No acute abnormalities. Electronically Signed   By: Ulyses Southward M.D.   On: 01/30/2023 07:48    Procedures Procedures    Medications Ordered in ED Medications  nitroGLYCERIN 50 mg in dextrose 5 % 250 mL (0.2 mg/mL) infusion (has no administration in time range)    ED Course/ Medical Decision Making/ A&P                             Medical Decision  Making Amount and/or Complexity of Data Reviewed Labs: ordered. Radiology: ordered.  Risk OTC drugs. Prescription drug management.   52 year old male presents the emergency department ongoing chest pain that radiates to his shoulder and back.  Patient has had episodes of chest pain like this before, follows with Dr. [name] from cardiology as an outpatient.  Recently had a coronary artery CT.  It seems like the impression from that study was that his chest pain was noncardiac.  Pain was initially 10 out of 10,  improved to 8 out of 10 with sublingual nitroglycerin.  Vitals are stable on arrival.  EKG is baseline for the patient with no acute ischemic changes.  Nitro drip started due to the degree of chest pain.  CTA shows no dissection/PE.  Blood work is reassuring, initial troponin is negative.  Even with titrating up on the nitroglycerin drip chest pain is unchanged.  Consulted cardiology.  Dr. Clifton James reviewed his previous imaging and personally evaluated the patient.  We are in agreement that this does not seem consistent with cardiac like chest pain/unstable angina.  Patient had most improvement with morphine treatment.  GI process considered. Abdominal labs normal, CT showed no acute abdominal pathology. Patient on protonix, will continue and add maalox.  Discontinue nitroglycerin drip.  Will plan for outpatient treatment of chest pain from a noncardiac standpoint with close outpatient cardiology follow-up.  No indication for admission or emergent cath at this time.  Repeat troponin was negative with no delta.        Final Clinical Impression(s) / ED Diagnoses Final diagnoses:  None    Rx / DC Orders ED Discharge Orders     None         Rozelle Logan, DO 01/30/23 1431    Adrinne Sze, Clabe Seal, DO 01/30/23 1513

## 2023-01-30 NOTE — ED Notes (Signed)
Pt now vomiting

## 2023-01-30 NOTE — ED Notes (Signed)
Pt arrives via ems c/o midsternal cp radiating into left arm and shoulder blades onset at work this morning at 3 am. Pt took his own 2 sl nito tabs hoping it would ease up but it did not so he went to [employer] where ems was called. EMS gave 324 ASA pta upon arrival to er pt reports pain still present rates 8/10 with nausea and sob 20g iv placed all labs including istat hiv and lactic drawn and sent to lab. Pt pending MD evaluation at present time.Pt a/o x 4 respirations deep and slow lungs cta he is no longer htn but reports he has not taken the rest of his BP meds which he usually takes with breakfast about 8-9 am.

## 2023-01-30 NOTE — ED Notes (Signed)
EKG also shot printed

## 2023-02-04 ENCOUNTER — Telehealth: Payer: Self-pay | Admitting: Nurse Practitioner

## 2023-02-04 ENCOUNTER — Encounter: Payer: Self-pay | Admitting: Nurse Practitioner

## 2023-02-04 ENCOUNTER — Emergency Department (HOSPITAL_COMMUNITY)
Admission: EM | Admit: 2023-02-04 | Discharge: 2023-02-04 | Disposition: A | Discharge status: Home / Self Care | Payer: 59 | Attending: Emergency Medicine | Admitting: Emergency Medicine

## 2023-02-04 ENCOUNTER — Ambulatory Visit (INDEPENDENT_AMBULATORY_CARE_PROVIDER_SITE_OTHER): Payer: 59 | Admitting: Nurse Practitioner

## 2023-02-04 ENCOUNTER — Other Ambulatory Visit: Payer: Self-pay

## 2023-02-04 ENCOUNTER — Emergency Department (HOSPITAL_COMMUNITY): Payer: 59

## 2023-02-04 ENCOUNTER — Encounter (HOSPITAL_COMMUNITY): Payer: Self-pay | Admitting: *Deleted

## 2023-02-04 VITALS — BP 140/92 | HR 74 | Ht 74.0 in | Wt 225.8 lb

## 2023-02-04 DIAGNOSIS — R42 Dizziness and giddiness: Secondary | ICD-10-CM

## 2023-02-04 DIAGNOSIS — I25119 Atherosclerotic heart disease of native coronary artery with unspecified angina pectoris: Secondary | ICD-10-CM | POA: Diagnosis not present

## 2023-02-04 DIAGNOSIS — Z7984 Long term (current) use of oral hypoglycemic drugs: Secondary | ICD-10-CM | POA: Diagnosis not present

## 2023-02-04 DIAGNOSIS — E119 Type 2 diabetes mellitus without complications: Secondary | ICD-10-CM | POA: Insufficient documentation

## 2023-02-04 DIAGNOSIS — R079 Chest pain, unspecified: Secondary | ICD-10-CM

## 2023-02-04 DIAGNOSIS — E871 Hypo-osmolality and hyponatremia: Secondary | ICD-10-CM | POA: Diagnosis not present

## 2023-02-04 DIAGNOSIS — Z79899 Other long term (current) drug therapy: Secondary | ICD-10-CM | POA: Insufficient documentation

## 2023-02-04 DIAGNOSIS — I10 Essential (primary) hypertension: Secondary | ICD-10-CM | POA: Diagnosis not present

## 2023-02-04 DIAGNOSIS — R002 Palpitations: Secondary | ICD-10-CM

## 2023-02-04 DIAGNOSIS — E785 Hyperlipidemia, unspecified: Secondary | ICD-10-CM | POA: Diagnosis not present

## 2023-02-04 DIAGNOSIS — Z0279 Encounter for issue of other medical certificate: Secondary | ICD-10-CM

## 2023-02-04 DIAGNOSIS — R0789 Other chest pain: Secondary | ICD-10-CM | POA: Insufficient documentation

## 2023-02-04 DIAGNOSIS — Z87898 Personal history of other specified conditions: Secondary | ICD-10-CM

## 2023-02-04 LAB — CBC WITH DIFFERENTIAL/PLATELET
Abs Immature Granulocytes: 0.01 10*3/uL (ref 0.00–0.07)
Basophils Absolute: 0 10*3/uL (ref 0.0–0.1)
Basophils Relative: 1 %
Eosinophils Absolute: 0.3 10*3/uL (ref 0.0–0.5)
Eosinophils Relative: 6 %
HCT: 46 % (ref 39.0–52.0)
Hemoglobin: 15.3 g/dL (ref 13.0–17.0)
Immature Granulocytes: 0 %
Lymphocytes Relative: 25 %
Lymphs Abs: 1.4 10*3/uL (ref 0.7–4.0)
MCH: 27.8 pg (ref 26.0–34.0)
MCHC: 33.3 g/dL (ref 30.0–36.0)
MCV: 83.5 fL (ref 80.0–100.0)
Monocytes Absolute: 0.5 10*3/uL (ref 0.1–1.0)
Monocytes Relative: 9 %
Neutro Abs: 3.3 10*3/uL (ref 1.7–7.7)
Neutrophils Relative %: 59 %
Platelets: 223 10*3/uL (ref 150–400)
RBC: 5.51 MIL/uL (ref 4.22–5.81)
RDW: 13.8 % (ref 11.5–15.5)
WBC: 5.6 10*3/uL (ref 4.0–10.5)
nRBC: 0 % (ref 0.0–0.2)

## 2023-02-04 LAB — BASIC METABOLIC PANEL
Anion gap: 10 (ref 5–15)
BUN: 15 mg/dL (ref 6–20)
CO2: 22 mmol/L (ref 22–32)
Calcium: 8.7 mg/dL — ABNORMAL LOW (ref 8.9–10.3)
Chloride: 102 mmol/L (ref 98–111)
Creatinine, Ser: 1.05 mg/dL (ref 0.61–1.24)
GFR, Estimated: >60 mL/min (ref >60)
Glucose, Bld: 265 mg/dL — ABNORMAL HIGH (ref 70–99)
Potassium: 4.1 mmol/L (ref 3.5–5.1)
Sodium: 134 mmol/L — ABNORMAL LOW (ref 135–145)

## 2023-02-04 LAB — D-DIMER, QUANTITATIVE: D-Dimer, Quant: <0.27 ug/mL-FEU (ref 0.00–0.50)

## 2023-02-04 LAB — TROPONIN I (HIGH SENSITIVITY)
Troponin I (High Sensitivity): 3 ng/L (ref <18)
Troponin I (High Sensitivity): 3 ng/L (ref <18)

## 2023-02-04 MED ORDER — ALUM & MAG HYDROXIDE-SIMETH 200-200-20 MG/5ML PO SUSP
30.0000 mL | Freq: Once | ORAL | Status: AC
  Administered 2023-02-04: 30 mL via ORAL
  Filled 2023-02-04: qty 30

## 2023-02-04 MED ORDER — PANTOPRAZOLE SODIUM 40 MG PO TBEC
40.0000 mg | DELAYED_RELEASE_TABLET | Freq: Once | ORAL | Status: AC
  Administered 2023-02-04: 40 mg via ORAL
  Filled 2023-02-04: qty 1

## 2023-02-04 MED ORDER — ISOSORBIDE MONONITRATE ER 30 MG PO TB24: 30.0000 mg | ORAL_TABLET | Freq: Every day | ORAL | 1 refills | Status: DC

## 2023-02-04 MED ORDER — HYDROCODONE-ACETAMINOPHEN 5-325 MG PO TABS: 1.0000 | ORAL_TABLET | Freq: Three times a day (TID) | ORAL | 0 refills | Status: DC | PRN

## 2023-02-04 MED ORDER — PANTOPRAZOLE SODIUM 40 MG PO TBEC: 40.0000 mg | DELAYED_RELEASE_TABLET | Freq: Every day | ORAL | 0 refills | Status: DC

## 2023-02-04 MED ORDER — ONDANSETRON HCL 4 MG/2ML IJ SOLN
4.0000 mg | Freq: Once | INTRAMUSCULAR | Status: AC
  Administered 2023-02-04: 4 mg via INTRAVENOUS
  Filled 2023-02-04: qty 2

## 2023-02-04 NOTE — ED Triage Notes (Signed)
Pt arrived from work for ongoing chest pain x 3 weeks. Was seen last week and discharged home. Reports having NV, sob which is worse on exertion but present just sitting. Has a "mild blockage' no cardiac stent placement .

## 2023-02-04 NOTE — ED Provider Notes (Signed)
[place] EMERGENCY DEPARTMENT AT [facility] Provider Note   CSN: [id number] Arrival date & time: 02/04/23  0149     History  Chief Complaint  Patient presents with   Chest Pain    Gary Wyatt is a 52 y.o. male.  The history is provided by the patient.  Chest Pain He has history of hypertension, diabetes, transient ischemic attack, and depression and has been having pressure across his chest since about noon.  It is now radiating into his left arm.  There is associated dyspnea and nausea but no vomiting and no diaphoresis.  He has been having similar episodes for several months and has had extensive workup.  In a previous ED visit, nitroglycerin was not effective.  He has seen a cardiologist and had a a coronary CT scan which did not show a cause for his pain, had a CT angiogram and an ED visit last week which also did not show a cause for his pain.  He has not noted any change in his discomfort with body position, exertion.  He is a non-smoker.  There is a family history of coronary artery disease but not of premature coronary atherosclerosis.  There is no history of hyperlipidemia.   Home Medications Prior to Admission medications   Medication Sig Start Date End Date Taking? Authorizing Provider  ALPRAZolam Prudy Feeler) 0.5 MG tablet Take 0.5 mg by mouth 2 (two) times daily as needed for anxiety. 12/05/20   [provider]  alum & mag hydroxide-simeth (MAALOX MAX) 400-400-40 MG/5ML suspension Take 5 mLs by mouth every 6 (six) hours as needed for indigestion. 01/30/23   Horton, Clabe Seal, DO  aspirin 81 MG EC tablet Take 81 mg by mouth daily. Swallow whole.    [provider]  atorvastatin (LIPITOR) 20 MG tablet Take 1 tablet (20 mg total) by mouth daily. 10/22/22   [name], NP  chlorthalidone (HYGROTON) 25 MG tablet Take 0.5 tablets (12.5 mg total) by mouth daily. 12/28/22   [name], NP  HYDROcodone-acetaminophen (NORCO) 5-325 MG tablet Take 1  tablet by mouth every 8 (eight) hours as needed for moderate pain. 01/30/23   Horton, Clabe Seal, DO  hydrOXYzine (ATARAX) 25 MG tablet Take 25-50 mg by mouth at bedtime as needed (sleep). 12/25/22   [provider]  isosorbide mononitrate (IMDUR) 30 MG 24 hr tablet Take 0.5 tablets (15 mg total) by mouth daily. 12/28/22 06/26/23  [name], NP  methylphenidate (RITALIN) 20 MG tablet Take 20 mg by mouth 2 (two) times daily.     [provider]  metoprolol tartrate (LOPRESSOR) 25 MG tablet Take 1 tablet (25 mg total) by mouth 2 (two) times daily. 12/28/22 06/26/23  [name], NP  nitroGLYCERIN (NITROSTAT) 0.4 MG SL tablet Place 1 tablet (0.4 mg total) under the tongue every 5 (five) minutes as needed. 11/22/22 02/20/23  [name], NP  OZEMPIC, 0.25 OR 0.5 MG/DOSE, 2 MG/3ML SOPN Inject 0.25 mg into the skin every Friday. 01/18/23   [provider]  pantoprazole (PROTONIX) 40 MG tablet Take 1 tablet (40 mg total) by mouth daily. 09/20/22   [name], NP  sildenafil (VIAGRA) 100 MG tablet Take 100 mg by mouth daily as needed for erectile dysfunction. 01/10/21   [provider]  TRESIBA FLEXTOUCH 200 UNIT/ML FlexTouch Pen Inject 32 Units into the skin daily at 6 (six) AM. 11/28/20   [provider]  valsartan (DIOVAN) 320 MG tablet Take 0.5 tablets (160 mg total)  by mouth daily. TAKE ONE TABLET EVERY DAY. needs appointment FOR refills 12/28/22   [name], NP      Allergies    Demerol [meperidine]    Review of Systems   Review of Systems  Cardiovascular:  Positive for chest pain.  All other systems reviewed and are negative.   Physical Exam Updated Vital Signs BP (!) 166/109   Pulse 90   Temp 98.1 F (36.7 C)   Resp (!) 28   SpO2 96%  Physical Exam Vitals and nursing note reviewed.   52 year old male, resting comfortably and in no acute distress. Vital signs are significant for elevated blood pressure and respiratory rate.  Oxygen saturation is 96%, which is normal. Head is normocephalic and atraumatic. PERRLA, EOMI. Oropharynx is clear. Neck is nontender and supple without adenopathy or JVD. Back is nontender and there is no CVA tenderness. Lungs are clear without rales, wheezes, or rhonchi. Chest is nontender. Heart has regular rate and rhythm without murmur. Abdomen is soft, flat, nontender. Extremities have no cyanosis or edema, full range of motion is present. Skin is warm and dry without rash. Neurologic: Mental status is normal, cranial nerves are intact, moves all extremities equally.  ED Results / Procedures / Treatments   Labs (all labs ordered are listed, but only abnormal results are displayed) Labs Reviewed  BASIC METABOLIC PANEL  CBC WITH DIFFERENTIAL/PLATELET  TROPONIN I (HIGH SENSITIVITY)    EKG EKG Interpretation  Date/Time:  Monday February 04 2023 02:03:52 EDT Ventricular Rate:  88 PR Interval:  145 QRS Duration: 86 QT Interval:  347 QTC Calculation: 420 R Axis:   45 Text Interpretation: Sinus rhythm Low voltage, precordial leads When compared with ECG of 01/30/2023, No significant change was found Confirmed by Dione Booze ([id number]) on 02/04/2023 2:14:11 AM  Radiology No results found.  Procedures Procedures  Cardiac monitor shows normal sinus rhythm, per my interpretation.  Medications Ordered in ED Medications  pantoprazole (PROTONIX) EC tablet 40 mg (has no administration in time range)  ondansetron (ZOFRAN) injection 4 mg (4 mg Intravenous Given 02/04/23 0253)  alum & mag hydroxide-simeth (MAALOX/MYLANTA) 200-200-20 MG/5ML suspension 30 mL (30 mLs Oral Given 02/04/23 0251)    ED Course/ Medical Decision Making/ A&P                             Medical Decision Making Amount and/or Complexity of Data Reviewed Labs: ordered. Radiology: ordered.  Risk OTC drugs. Prescription drug management.   Chest pain of uncertain cause with significant prior workup unrevealing.   Differential diagnosis would certainly include ACS, pulmonary embolism, aortic dissection, GERD with esophageal spasm.  I have reviewed his past records, and on 10/17/2022 he had a CT coronary angiogram which showed no occlusions greater than 50% making obstructive coronary artery disease very unlikely but still possibility of coronary artery spasm.  On 01/30/2023 he had a CT angiogram of chest/abdomen/pelvis for aortic dissection which showed no evidence of aortic dissection.  I have reviewed the images, and pulmonary arteries are well filled and no evidence of large pulmonary emboli even though this study was not designed to look for pulmonary emboli.  I have reviewed and interpreted his electrocardiogram and my interpretation is low voltage but no acute ST or T changes and unchanged from prior.  I have ordered CBC, comprehensive metabolic panel, troponin x 2, D-dimer and I have ordered a dose of ondansetron for nausea and a dose  of Maalox.  Patient does state on other ED visits nitroglycerin did not give him any relief and morphine gave only temporary relief.  With his negative workup for obstructive coronary artery disease and aortic dissection, I am left with esophageal spasm and coronary artery spasm as the most likely diagnoses.  He had no relief with antacid.  I have reviewed his laboratory tests tonight, and my interpretation is mild hyponatremia which is not felt to be clinically significant and is unchanged from 01/26/2023, normal CBC, normal troponin x 2, normal D-dimer.  I have talked with patient about possible diagnoses and he tells me that he had an endoscopy done in the past.  I reviewed his record and he did have upper endoscopy on 07/25/2021 with findings suspicious for eosinophilic esophagitis.  I am discharging him with prescription for pantoprazole with initial dose being given prior to discharge.  I am also giving her prescription for a small number of hydrocodone-acetaminophen tablets just  for peer pain relief.  I am referring him back to cardiology for consideration for additional workup specifically looking for coronary artery spasm.  I am also referring him to gastroenterology.  Return precautions discussed.  Final Clinical Impression(s) / ED Diagnoses Final diagnoses:  Nonspecific chest pain  Hyponatremia    Rx / DC Orders ED Discharge Orders          Ordered    HYDROcodone-acetaminophen (NORCO) 5-325 MG tablet  Every 8 hours PRN        02/04/23 0648    pantoprazole (PROTONIX) 40 MG tablet  Daily       Note to Pharmacy: Stopping Prilosec, med changed 09/20/2022   02/04/23 0648    Ambulatory referral to Cardiology       Comments: If you have not heard from the Cardiology office within the next 72 hours please call [phone].   02/04/23 8295              Dione Booze, MD 02/04/23 [phone]

## 2023-02-04 NOTE — Progress Notes (Unsigned)
Cardiology Office Note:    Date: 02/04/2023  ID:  Gary Wyatt, DOB 25-Jan-1971, MRN [id number]  PCP:  Gary Specking, MD   [facility] Providers Cardiologist:  Gary Rich, MD     Referring MD: Gary Specking, MD   CC: Here for follow-up  History of Present Illness:    Gary Wyatt is a very pleasant 52 y.o. male with a hx of the following:  Mild nonobstructive CAD Hx of TIA HTN T2DM Aortic dilatation History of chest pain GERD  In 2021 had a low risk stress test. In 2022 when he saw Dr. Dina Wyatt, he reported difficulty with his blood pressures.  Valsartan was started at 320 mg daily, as well as chlorthalidone.  Dr. Wyline Wyatt discussed changing Toprol-XL to more effective BB and possibly considering Aldactone for history of resistant HTN.  He also mentioned that a renal artery ultrasound or sleep study could be considered if he were to have ongoing issues with BP. Toprol-XL was stopped and labetalol was started. BP's became low and was symptomatic, labetalol was stopped.  TTE in February 2023 revealed EF 55 to 60%, no RWMA, trivial MR, borderline dilatation of aortic arch, measuring 35 mm, otherwise no significant valvular abnormalities.  09/20/2022 - saw pt for follow-up. CC included chest pain, shortness of breath, lightheadedness, seizures, and ringing in the ears. GF noticed he was tossing in the bed a lot more, pt woke up with the inside of his mouth being "chewed up" and felt very fatigued and went to sleep, episode lasted around 20 minutes.  Did not happen before and did not seek ED care. PCP recommended that if it happened again to call 911 immediately. Continued to have chronic chest pain, similar to 1 year ago.  Described it as tightness along lower chest and upper abdomen, nonradiating, x few hours in duration, not associated with meals.  Nothing made the pain worse, intermittent in nature.  Admitted to mild to shortness of breath with exertion as well as  associated symptoms of lightheadedness and ringing in the ears. Noticed that his shortness of breath had gotten worse 1 year ago. CCTA arranged and revealed coronary calcium score 14.8, mild nonobstructive CAD (25-49%).  Recommended to consider nonatherosclerotic causes of chest pain and consider preventative therapy and risk factor modification.  Was told to follow-up in 6 to 8 weeks.  10/22/2022 - Saw him for follow-up.  Contacted our office on October 18, 2022 for chest pain, he was advised to seek ED care.  Presented to [facility] with chest pain.  Was also complaining of dizziness and shortness of breath, associated symptom including dizziness and "near syncopal episode in waiting room."  However he tells me he had 2 passing out spells, girlfriend observed these happening.  EKG was unremarkable.  Troponins were negative.  Was discharged in stable condition.  Symptoms around the event included tingling in hands, feeling sick, also noted chest pain. Denied recurrent syncopal episodes, still noteed CP.  Denied any other associated symptoms. Does note occasional orthostatic dizziness.  Atorvastatin was initiated and Echo was overall unremarkable.   11/22/2022 - Continued to note similar CP, had some episodes at work where it caused him to sit down. Not associated with exertion, random in occurrence. Described as left sided, tightness, non-radiating, 10/10 in intensity. Nothing seemed to make CP worse. York Spaniel thinks "it could all be in my head." Says he is frustrated as it has been ongoing and seems to be getting worse  over time. Also notes similar shortness of breath since last visit.   12/28/2022 - Saw for follow-up. Denied any resolution or improvement in his symptoms, including CP and SHOB. Said he had a brief syncopal episode while working outside, lasted for 3 seconds, denied any symptoms surrounding event. Admitted to occasional palpitations. Denied any dizziness, orthopnea, PND, swelling or significant  weight changes, acute bleeding, or claudication.  Presented to [facility] on January 30, 2023 for chest pain evaluation.  Was described as midsternal, sharp, radiating to left upper extremity.  Took 2 nitroglycerin, pain relieved from 10 out of 10 to 8 out of 10.  Denied any shortness of breath, edema.  EKG was unremarkable.  CTA was negative for dissection/PE.  Cardiology was consulted.  It was felt that chest pain was noncardiac, consider GI process.  Troponins unremarkable.  Today he presented to [facility] ED with chest pain prior to this office visit.  He had associated dyspnea and nausea, denied any vomiting or diaphoresis.  EKG again was unremarkable.  Was given an antacid, no relief.  Troponins negative.  D-dimer negative.  Was given a prescription of Protonix.  Was referred to gastroenterology.  Workup to be considered for coronary artery vasospasm.  Today he presents for follow-up. Wants to know what needs can be done to relieve his chest pain. Was recommended to follow-up with GI. Denies any shortness of breath, palpitations, syncope, presyncope, dizziness, orthopnea, PND, swelling or significant weight changes, acute bleeding, or claudication.  SH: Works third shift at [employer].  Denies any tobacco use.   Past Medical History:  Diagnosis Date   Anxiety    Depression    Diabetes mellitus (HCC)    type 2   Hypertension    Insomnia    Kidney stone    Narcolepsy    TIA (transient ischemic attack)     Past Surgical History:  Procedure Laterality Date   BIOPSY  07/25/2021   Procedure: BIOPSY;  Surgeon: Lanelle Bal, DO;  Location: [facility];  Service: Endoscopy;;   ESOPHAGOGASTRODUODENOSCOPY (EGD) WITH PROPOFOL N/A 07/25/2021   Procedure: ESOPHAGOGASTRODUODENOSCOPY (EGD) WITH PROPOFOL;  Surgeon: Lanelle Bal, DO;  Location: [facility];  Service: Endoscopy;  Laterality: N/A;  7:30am   NO PAST SURGERIES      Current Meds  Medication Sig   ALPRAZolam (XANAX)  0.5 MG tablet Take 0.5 mg by mouth 2 (two) times daily as needed for anxiety.   alum & mag hydroxide-simeth (MAALOX MAX) 400-400-40 MG/5ML suspension Take 5 mLs by mouth every 6 (six) hours as needed for indigestion.   aspirin 81 MG EC tablet Take 81 mg by mouth daily. Swallow whole.   atorvastatin (LIPITOR) 20 MG tablet Take 1 tablet (20 mg total) by mouth daily.   chlorthalidone (HYGROTON) 25 MG tablet Take 0.5 tablets (12.5 mg total) by mouth daily.   HYDROcodone-acetaminophen (NORCO) 5-325 MG tablet Take 1 tablet by mouth every 8 (eight) hours as needed for moderate pain.   hydrOXYzine (ATARAX) 25 MG tablet Take 25-50 mg by mouth at bedtime as needed (sleep).   methylphenidate (RITALIN) 20 MG tablet Take 20 mg by mouth 2 (two) times daily.    metoprolol tartrate (LOPRESSOR) 25 MG tablet Take 1 tablet (25 mg total) by mouth 2 (two) times daily.   nitroGLYCERIN (NITROSTAT) 0.4 MG SL tablet Place 1 tablet (0.4 mg total) under the tongue every 5 (five) minutes as needed.   OZEMPIC, 0.25 OR 0.5 MG/DOSE, 2 MG/3ML SOPN  Inject 0.25 mg into the skin every Friday.   pantoprazole (PROTONIX) 40 MG tablet Take 1 tablet (40 mg total) by mouth daily.   sildenafil (VIAGRA) 100 MG tablet Take 100 mg by mouth daily as needed for erectile dysfunction.   TRESIBA FLEXTOUCH 200 UNIT/ML FlexTouch Pen Inject 32 Units into the skin at bedtime.   valsartan (DIOVAN) 320 MG tablet Take 0.5 tablets (160 mg total) by mouth daily. TAKE ONE TABLET EVERY DAY. needs appointment FOR refills    isosorbide mononitrate (IMDUR) 30 MG 24 hr tablet Take 0.5 tablets (15 mg total) by mouth daily.    Allergies:   Demerol [meperidine]   Social History   Socioeconomic History   Marital status: Divorced    Spouse name: Not on file   Number of children: Not on file   Years of education: Not on file   Highest education level: Not on file  Occupational History   Not on file  Tobacco Use   Smoking status: Never    Passive  exposure: Never   Smokeless tobacco: Current    Types: Snuff  Vaping Use   Vaping Use: Never used  Substance and Sexual Activity   Alcohol use: Yes    Comment: occas   Drug use: No   Sexual activity: Not on file  Other Topics Concern   Not on file  Social History Narrative   Not on file   Social Determinants of Health   Financial Resource Strain: Not on file  Food Insecurity: Not on file  Transportation Needs: Not on file  Physical Activity: Not on file  Stress: Not on file  Social Connections: Not on file     Family History: The patient's family history includes CAD in his father; Diabetes in his father; Hypertension in his mother; Prostate cancer in his father. There is no history of Colon cancer.  ROS:      Please see the history of present illness.     All other systems reviewed and are negative.  EKGs/Labs/Other Studies Reviewed:    The following studies were reviewed today:   EKG:  EKG is not ordered today.    Cardiac monitor 12/06/2022: Patient had a min HR of 54 bpm, max HR of 197 bpm, and avg HR of 86 bpm. Predominant underlying rhythm was Sinus Rhythm. 10 Supraventricular Tachycardia runs occurred, the run with the fastest interval lasting 9.6 secs with a max rate of 197 bpm, the  longest lasting 25.2 secs with an avg rate of 136 bpm. Isolated SVEs were rare (<1.0%), SVE Couplets were rare (<1.0%), and SVE Triplets were rare (<1.0%). Isolated VEs were rare (<1.0%), and no VE Couplets or VE Triplets were present.   Carotid duplex on 11/11/2022:  L ICA stenosis 1-39% R ICA was near-normal with minimal wall thickening or plaque.   Echocardiogram on 11/09/2022:  1. Left ventricular ejection fraction, by estimation, is 60 to 65%. The  left ventricle has normal function. The left ventricle has no regional  wall motion abnormalities. Left ventricular diastolic parameters are  consistent with Grade I diastolic  dysfunction (impaired relaxation). The average left  ventricular global  longitudinal strain is -18.6 %. The global longitudinal strain is normal.   2. Right ventricular systolic function is normal. The right ventricular  size is normal.   3. The mitral valve is normal in structure. No evidence of mitral valve  regurgitation. No evidence of mitral stenosis.   4. The aortic valve is tricuspid. Aortic valve regurgitation  is not  visualized. No aortic stenosis is present.   Comparison(s): Echocardiogram done 11/21/21 showed an EF of 55-60%.   Cardiac monitor results pending 10/23/2022  Coronary CTA on October 17, 2022: IMPRESSION: 1. Coronary calcium score of 14.8. This was 62nd percentile for age-, sex, and race-matched controls.   2. Normal coronary origin with left dominance.   3. Mild non-calcified plaque in the mid LAD (25-49%).   4. Mild calcified plaque in the distal LCX (25-49%).   5. Minimal non-calcified plaque (<25%) in the non-dominant RCA.  6.  No significant extracardiac findings.   RECOMMENDATIONS: 1. Mild non-obstructive CAD (25-49%). Consider non-atherosclerotic causes of chest pain. Consider preventive therapy and risk factor modification.  Echocardiogram on 12/19/2021:  1. Left ventricular ejection fraction, by estimation, is 55 to 60%. The  left ventricle has normal function. The left ventricle has no regional  wall motion abnormalities. Left ventricular diastolic parameters are  indeterminate. Normal global longitudinla   strain of -17.1%.   2. Right ventricular systolic function is normal. The right ventricular  size is normal. Tricuspid regurgitation signal is inadequate for assessing  PA pressure.   3. The mitral valve is grossly normal. Trivial mitral valve  regurgitation.   4. The aortic valve is tricuspid. Aortic valve regurgitation is not  visualized. No aortic stenosis is present. Aortic valve mean gradient  measures 5.0 mmHg.   5. The inferior vena cava is normal in size with greater than 50%   respiratory variability, suggesting right atrial pressure of 3 mmHg.   6. Aortic dilatation noted. There is borderline dilatation of the aortic  arch, measuring 35 mm.   Comparison(s): Prior images unable to be directly viewed.  Recent Labs: 01/30/2023: ALT 30 02/04/2023: BUN 15; Creatinine, Ser 1.05; Hemoglobin 15.3; Platelets 223; Potassium 4.1; Sodium 134  Recent Lipid Panel No results found for: "CHOL", "TRIG", "HDL", "CHOLHDL", "VLDL", "LDLCALC", "LDLDIRECT"  Physical Exam:    VS:  BP (!) 140/92 (BP Location: Left Arm, Patient Position: Sitting, Cuff Size: Large)   Pulse 74   Ht 6\' 2"  (1.88 m)   Wt 225 lb 12.8 oz (102.4 kg)   SpO2 96%   BMI 28.99 kg/m     Wt Readings from Last 3 Encounters:  02/04/23 225 lb 12.8 oz (102.4 kg)  01/30/23 225 lb (102.1 kg)  01/16/23 223 lb 9.6 oz (101.4 kg)     Previous Orthostatics: Lying: 148/88, 83 heart rate Sitting: 143/91, 91 heart rate Standing: 127/84, 93 heart rate Standing for 3 minutes: 136/91, 99 heart rate  GEN: Overweight, 52 year old male in no acute distress HEENT: Normal NECK: No JVD; No carotid bruits CARDIAC: S1/S2, RRR, no murmurs, rubs, gallops; 2+ pulses  RESPIRATORY:  Clear and diminished to auscultation without rales, wheezing or rhonchi  MUSCULOSKELETAL:  No edema; No deformity  SKIN: Warm and dry NEUROLOGIC:  Alert and oriented x 3 PSYCHIATRIC:  Normal affect   ASSESSMENT:    1. Coronary artery disease involving native heart with angina pectoris, unspecified vessel or lesion type (HCC)   2. Chest pain, unspecified type   3. Hyperlipidemia, unspecified hyperlipidemia type   4. Palpitations   5. Essential hypertension, benign   6. Hx of syncope   7. Orthostatic dizziness     PLAN:    In order of problems listed above:  Mild nonobstructive CAD, chest pain, hyperlipidemia, hx of palpitations Has been ongoing, similar quality since last office visit, atypical, random in occurrence, not associated with  exertion. See recent  ED visits. Previous CCTA revealed mild nonobstructive CAD and to consider nonatherosclerotic causes of chest pain and consider preventative therapy and risk factor modification. Did have low risk NST in 2021.  Believe anxiety is contributing to his symptoms.  Continue aspirin, atorvastatin, and NG PRN. Will increase Imdur to 30 mg daily. Continue Lopressor 25 mg BID for his hx of occasional palpitations.  Discussed to avoid Viagra and NG use within 48 hours of one another, and he verbalized understanding. Heart healthy diet and regular cardiovascular exercise encouraged.  ED precautions discussed. At next OV, will recheck FLP and LFT per protocol. If max titration of medical management does not improve symptoms, consider cardiac cath. Continue to follow with PCP.  Hypertension Blood pressure elevated.  BP well-controlled at home. Increasing Imdur, continue rest of medication regimen.  Discussed to monitor BP at home at least 2 hours after medications and sitting for 5-10 minutes. Heart healthy diet and regular cardiovascular exercise encouraged.    Seizure?, hx of syncope, orthostatic dizziness Questionable past possible seizure witnessed by s/o. Previously reported two syncopal episodes at ED visit at [facility], witnessed by significant other, one brief ? Episode since last visit.  Etiology unclear. Recent workup unremarkable. Orthostatics did reveal drop in BP on standing, but not considered orthostatic hypotension. Dizziness improved. Continue to follow with PCP.  Previously advised and recommended patient to avoid driving at the current time for at least 6 months.  ED precautions discussed.  Discussed conservative measures for orthostatic dizziness including wearing compression stockings, changing positions slowly, and staying adequately hydrated.  He verbalized understanding. Follow-up with Neuro as scheduled.   4. Disposition: Follow-up with me or APP in 6-8 weeks or sooner  if anything changes.     Medication Adjustments/Labs and Tests Ordered: Current medicines are reviewed at length with the patient today.  Concerns regarding medicines are outlined above.  No orders of the defined types were placed in this encounter.  Meds ordered this encounter  Medications   isosorbide mononitrate (IMDUR) 30 MG 24 hr tablet    Sig: Take 1 tablet (30 mg total) by mouth daily.    Dispense:  90 tablet    Refill:  1    02/04/23 Dose Increase    Patient Instructions  Medication Instructions:  Your physician has recommended you make the following change in your medication:  INCREASE isosorbide to 30 mg once a day Continue all other medications as directed  Labwork: none  Testing/Procedures: none  Follow-Up:  Your physician recommends that you schedule a follow-up appointment in: 6-8 weeks  Any Other Special Instructions Will Be Listed Below (If Applicable).  If you need a refill on your cardiac medications before your next appointment, please call your pharmacy.    Signed, [name], NP  02/05/2023 11:02 PM    [facility]

## 2023-02-04 NOTE — Telephone Encounter (Signed)
FYI--Patient states he plans on dropping off FMLA paperwork at the office today. He is hopeful to have it completed ASAP.

## 2023-02-04 NOTE — Telephone Encounter (Signed)
Pt dropped off FMLA forms to be completed   Forms scanned, forms signed, $29 payment received in cash and placed in safe  Forms given to E. Philis Nettle

## 2023-02-04 NOTE — Patient Instructions (Signed)
Medication Instructions:  Your physician has recommended you make the following change in your medication:  INCREASE isosorbide to 30 mg once a day Continue all other medications as directed  Labwork: none  Testing/Procedures: none  Follow-Up:  Your physician recommends that you schedule a follow-up appointment in: 6-8 weeks  Any Other Special Instructions Will Be Listed Below (If Applicable).  If you need a refill on your cardiac medications before your next appointment, please call your pharmacy.

## 2023-02-04 NOTE — Discharge Instructions (Signed)
The cause of your chest pain is not clear, but at this point there is no sign of any serious problem behind it.  Please follow-up with the cardiologist for additional testing to try to find out what it is.  I also feel he should follow-up with a gastroenterologist to make sure that the problem is not from the stomach or esophagus.  At any point, if symptoms or not being adequately controlled at home and you have concerns, feel free to return to the emergency department.

## 2023-02-05 ENCOUNTER — Ambulatory Visit: Payer: 59 | Admitting: Neurology

## 2023-02-05 NOTE — Telephone Encounter (Signed)
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 Says his situation has changed and he needed to return to work due to personal issues at home.

## 2023-02-05 NOTE — Telephone Encounter (Signed)
Patient is calling stating he needs the FMLA forms to clear him to go back to work on Thursday. Please advise.

## 2023-02-05 NOTE — Telephone Encounter (Signed)
Forms completed by E. Peck and faxed to number given. Pt informed  Will send to billing to place charge   Return to work note was pick up by pt today.

## 2023-02-11 ENCOUNTER — Telehealth: Payer: Self-pay | Admitting: *Deleted

## 2023-02-11 NOTE — Telephone Encounter (Signed)
Reports BP being elevated since Friday. BP this morning after getting off work was 154/104. Reports all BP's have been taken while at work around 1:00 am. BP taken while on phone and is 160/78.  Also c/o that imdur 30 mg  isn't helping chest pain. Reports active chest pain rated 8/10 with SOB. Reports his chest pain is constant and has been for some time. Denies dizziness. Did not sound SOB on phone. Medication reviewed and discovered that patient continued taking chlorthalidone 25 mg daily after 12/28/2022 visit. Reports taking medications each day around 9:00 am. Advised that he should check his BP consistently, same arm, same cuff, same time, preferably 1-2 hours after medications, record numbers and contact office with readings. Advised that he needed to go to the ED for an evaluation but declined stating that ED isn't able to help him and he is scheduled to work Quarry manager. Advised to try taking imdur 45 mg daily to see if chest pain improves. Advised if he develops worsening symptoms, to go to the ED for an evaluation. Verbalized understanding of plan.

## 2023-02-11 NOTE — Telephone Encounter (Signed)
Pt returned call

## 2023-02-11 NOTE — Telephone Encounter (Signed)
Pt returning call

## 2023-02-12 MED ORDER — ISOSORBIDE MONONITRATE ER 30 MG PO TB24: 45.0000 mg | ORAL_TABLET | Freq: Every day | ORAL | 1 refills | Status: AC

## 2023-02-15 ENCOUNTER — Ambulatory Visit: Payer: 59 | Admitting: Nurse Practitioner

## 2023-02-18 ENCOUNTER — Telehealth: Payer: Self-pay | Admitting: Nurse Practitioner

## 2023-02-18 NOTE — Telephone Encounter (Signed)
Gary Wyatt w/ Gary Wyatt pharm notified

## 2023-02-18 NOTE — Telephone Encounter (Signed)
Please help me clarify - is his Valsartan supposed to be whole tab or 1/2 tab daily ?

## 2023-02-18 NOTE — Telephone Encounter (Signed)
New Message:       She needs clarification of patient's directions for his Diovan 320 mg.

## 2023-02-28 ENCOUNTER — Ambulatory Visit: Payer: 59 | Admitting: Internal Medicine

## 2023-03-01 ENCOUNTER — Encounter (HOSPITAL_BASED_OUTPATIENT_CLINIC_OR_DEPARTMENT_OTHER): Payer: 59

## 2023-03-05 ENCOUNTER — Ambulatory Visit: Payer: 59 | Admitting: Internal Medicine

## 2023-03-05 ENCOUNTER — Encounter (HOSPITAL_BASED_OUTPATIENT_CLINIC_OR_DEPARTMENT_OTHER): Payer: 59

## 2023-03-08 ENCOUNTER — Ambulatory Visit: Payer: 59 | Admitting: Neurology

## 2023-04-02 ENCOUNTER — Ambulatory Visit: Payer: 59 | Attending: Nurse Practitioner | Admitting: Nurse Practitioner

## 2023-04-02 ENCOUNTER — Encounter: Payer: Self-pay | Admitting: Nurse Practitioner

## 2023-04-02 VITALS — BP 165/97 | HR 90 | Ht 74.0 in | Wt 221.2 lb

## 2023-04-02 DIAGNOSIS — E785 Hyperlipidemia, unspecified: Secondary | ICD-10-CM | POA: Diagnosis not present

## 2023-04-02 DIAGNOSIS — M7918 Myalgia, other site: Secondary | ICD-10-CM | POA: Diagnosis not present

## 2023-04-02 DIAGNOSIS — R55 Syncope and collapse: Secondary | ICD-10-CM

## 2023-04-02 DIAGNOSIS — R079 Chest pain, unspecified: Secondary | ICD-10-CM

## 2023-04-02 DIAGNOSIS — I25119 Atherosclerotic heart disease of native coronary artery with unspecified angina pectoris: Secondary | ICD-10-CM | POA: Diagnosis not present

## 2023-04-02 DIAGNOSIS — I10 Essential (primary) hypertension: Secondary | ICD-10-CM

## 2023-04-02 DIAGNOSIS — Z0279 Encounter for issue of other medical certificate: Secondary | ICD-10-CM

## 2023-04-02 DIAGNOSIS — R42 Dizziness and giddiness: Secondary | ICD-10-CM

## 2023-04-02 MED ORDER — PANTOPRAZOLE SODIUM 40 MG PO TBEC: DELAYED_RELEASE_TABLET | ORAL | 0 refills | Status: DC

## 2023-04-02 MED ORDER — AMLODIPINE BESYLATE 5 MG PO TABS: 5.0000 mg | ORAL_TABLET | Freq: Every day | ORAL | 1 refills | Status: DC

## 2023-04-02 MED ORDER — ACETAMINOPHEN 325 MG PO TABS
1000.0000 mg | ORAL_TABLET | Freq: Two times a day (BID) | ORAL | Status: DC | PRN
Start: 2023-04-02 — End: 2023-11-14

## 2023-04-02 NOTE — Patient Instructions (Addendum)
Medication Instructions:  Your physician has recommended you make the following change in your medication:  Start taking Protonix 40 Mg 2 times daily for 2 weeks them reduce back to 1 40 Mg tablet daily Start taking Amlodipine 5 Mg Daily Start Taking Tylenol 1,000 Mg 2 times daily as needed Continue all other medications as prescribed.   Labwork: none  Testing/Procedures: none  Follow-Up: Your physician recommends that you schedule a follow-up appointment in: 2-3 Month telephone visit-Peck  Any Other Special Instructions Will Be Listed Below (If Applicable). Referral sent to electrophysiology- Dr.Mealor    If you need a refill on your cardiac medications before your next appointment, please call your pharmacy.

## 2023-04-02 NOTE — Progress Notes (Signed)
Cardiology Office Note:  .   Date:  04/02/2023  ID:  AYDRIEN Gary Wyatt, DOB 05-02-1971, MRN [id number] PCP: [name], MD  [facility] Providers Cardiologist:  [name], MD    History of Present Illness: .   Gary Wyatt is a 52 y.o. male with a PMH of mild, nonobstructive CAD, hx chest pain, hx of of TIA, HTN, T2DM, aortic dilatation, and GERD, who presents today for follow-up.   I have been following him since 09/2022. Visit 09/2022 included multiple concerns. Most recently, he has had a hx of chest pain, SHOB,and syncopal episodes of unclear etiology. Work-up unremarkable and noted below.   Last seen by me on February 04, 2023. Reviewed past ED visits, was recommended he follow-up with GI with what sounded like GI symptoms contributing to chest pain as workup was unremarkable. Was instructed to follow-up with Neurology as scheduled for syncope.   Today he presents for follow-up. He states he notes similar, chronic/intermittent chest pain from past OV, described as tightness along lower chest and upper abdomen. Has not followed up with GI as previously recommended. He admits to 2-3 more additional syncopal episodes, unsure of triggers/causes, brief episodes of LOC associated with spells. Denies any warning signs prior to syncopal events. Denies any shortness of breath, palpitations, presyncope, dizziness, orthopnea, PND, swelling or significant weight changes, acute bleeding, or claudication.  SH: Works third shift at [employer]. Denies any tobacco use.   Studies Reviewed: Marland Kitchen        EKG:  EKG is not ordered today.     Cardiac monitor 12/06/2022: Patient had a min HR of 54 bpm, max HR of 197 bpm, and avg HR of 86 bpm. Predominant underlying rhythm was Sinus Rhythm. 10 Supraventricular Tachycardia runs occurred, the run with the fastest interval lasting 9.6 secs with a max rate of 197 bpm, the  longest lasting 25.2 secs with an avg rate of 136 bpm. Isolated SVEs were rare  (<1.0%), SVE Couplets were rare (<1.0%), and SVE Triplets were rare (<1.0%). Isolated VEs were rare (<1.0%), and no VE Couplets or VE Triplets were present.    Carotid duplex on 11/11/2022:  L ICA stenosis 1-39% R ICA was near-normal with minimal wall thickening or plaque.    Echocardiogram on 11/09/2022:  1. Left ventricular ejection fraction, by estimation, is 60 to 65%. The  left ventricle has normal function. The left ventricle has no regional  wall motion abnormalities. Left ventricular diastolic parameters are  consistent with Grade I diastolic  dysfunction (impaired relaxation). The average left ventricular global  longitudinal strain is -18.6 %. The global longitudinal strain is normal.   2. Right ventricular systolic function is normal. The right ventricular  size is normal.   3. The mitral valve is normal in structure. No evidence of mitral valve  regurgitation. No evidence of mitral stenosis.   4. The aortic valve is tricuspid. Aortic valve regurgitation is not  visualized. No aortic stenosis is present.   Comparison(s): Echocardiogram done 11/21/21 showed an EF of 55-60%.     Cardiac monitor results pending 10/23/2022   Coronary CTA on October 17, 2022: IMPRESSION: 1. Coronary calcium score of 14.8. This was 62nd percentile for age-, sex, and race-matched controls.   2. Normal coronary origin with left dominance.   3. Mild non-calcified plaque in the mid LAD (25-49%).   4. Mild calcified plaque in the distal LCX (25-49%).   5. Minimal non-calcified plaque (<25%) in the non-dominant RCA.  6.  No significant extracardiac findings.   RECOMMENDATIONS: 1. Mild non-obstructive CAD (25-49%). Consider non-atherosclerotic causes of chest pain. Consider preventive therapy and risk factor modification.   Echocardiogram on 12/19/2021:  1. Left ventricular ejection fraction, by estimation, is 55 to 60%. The  left ventricle has normal function. The left ventricle has no  regional  wall motion abnormalities. Left ventricular diastolic parameters are  indeterminate. Normal global longitudinla   strain of -17.1%.   2. Right ventricular systolic function is normal. The right ventricular  size is normal. Tricuspid regurgitation signal is inadequate for assessing  PA pressure.   3. The mitral valve is grossly normal. Trivial mitral valve  regurgitation.   4. The aortic valve is tricuspid. Aortic valve regurgitation is not  visualized. No aortic stenosis is present. Aortic valve mean gradient  measures 5.0 mmHg.   5. The inferior vena cava is normal in size with greater than 50%  respiratory variability, suggesting right atrial pressure of 3 mmHg.   6. Aortic dilatation noted. There is borderline dilatation of the aortic  arch, measuring 35 mm.   Risk Assessment/Calculations:    HYPERTENSION CONTROL Vitals:   04/02/23 0806 04/02/23 0816 04/02/23 0851  BP: (!) 164/104 (!) 160/98 (!) 165/97    The patient's blood pressure is elevated above target today.  In order to address the patient's elevated BP: A new medication was prescribed today.; Follow up with general cardiology has been recommended.; Blood pressure will be monitored at home to determine if medication changes need to be made.          Physical Exam:   VS:  BP (!) 165/97 (BP Location: Left Arm, Patient Position: Sitting, Cuff Size: Normal)   Pulse 90   Ht 6\' 2"  (1.88 m)   Wt 221 lb 3.2 oz (100.3 kg)   SpO2 97%   BMI 28.40 kg/m    Wt Readings from Last 3 Encounters:  04/02/23 221 lb 3.2 oz (100.3 kg)  02/04/23 225 lb 12.8 oz (102.4 kg)  01/30/23 225 lb (102.1 kg)    GEN: Well nourished, well developed in no acute distress NECK: No JVD; No carotid bruits CARDIAC: S1/S2, RRR, no murmurs, rubs, gallops RESPIRATORY:  Clear to auscultation without rales, wheezing or rhonchi  EXTREMITIES:  No edema; No deformity   ASSESSMENT AND PLAN: .     Mild nonobstructive CAD, chest pain,  hyperlipidemia, MSK pain Continues to note stable and atypical CP not associated with exertion. Previous CCTA - mild nonobstructive CAD. Low risk NST in 2021.  Believe anxiety is contributing to his symptoms.  Continue aspirin, atorvastatin, Imdur, and NTG PRN. Will start Amlodipine 5 mg daily and recommended to take Protonix 40 mg BID x 2 weeks, then reduce to 40 mg daily for what sounds like GI related chest pain and Tylenol 1,000 mg BID for MSK pain. Continue Lopressor 25 mg BID for his hx of occasional palpitations. Heart healthy diet and regular cardiovascular exercise encouraged.  ED precautions discussed. If max titration of medical management does not improve symptoms, consider cardiac cath. Continue to follow with PCP.   Hypertension Blood pressure elevated. Starting Amlodipine 5 mg daily, continue rest of medication regimen.  Discussed to monitor BP at home at least 2 hours after medications and sitting for 5-10 minutes. Heart healthy diet and regular cardiovascular exercise encouraged.    Seizure?, recurrent syncope, orthostatic dizziness Questionable past possible seizure witnessed by s/o, no recent recurrences. Hx of recurrent syncopal episodes. Etiology unclear. Recent  workup unremarkable. Orthostatics did reveal drop in BP on standing, but not considered orthostatic hypotension. Previously advised and recommended patient to avoid driving at the current time for at least 6 months.  ED precautions discussed.  Discussed conservative measures for orthostatic dizziness including wearing compression stockings, changing positions slowly, and staying adequately hydrated.  He verbalized understanding. Has not followed up with Neuro as previously advised. Will refer to EP, may benefit from loop recorder for further evaluation.     Dispo: Follow-up with me or APP via telephone visit in 2-3 months.   Signed, [name], NP

## 2023-04-03 ENCOUNTER — Telehealth: Payer: Self-pay | Admitting: Nurse Practitioner

## 2023-04-03 NOTE — Telephone Encounter (Signed)
Pt dropped off additional FMLA forms to be completed by E. Philis Nettle.   Release signed, forms information sheet signed  $29 check payment received   Forms placed on E. Pecks desk

## 2023-04-09 NOTE — Telephone Encounter (Signed)
Completed forms faxed to number given  Pt informed of completion   Billing form faxed to billing to drop charge

## 2023-04-15 ENCOUNTER — Telehealth: Payer: Self-pay | Admitting: Nurse Practitioner

## 2023-04-15 NOTE — Telephone Encounter (Signed)
Dawn with [facility] has received patients FMLA paperwork but has some questions regarding it. Please advise.

## 2023-04-17 ENCOUNTER — Telehealth: Payer: Self-pay | Admitting: Nurse Practitioner

## 2023-04-17 NOTE — Telephone Encounter (Signed)
Gary Wyatt has some questions regarding pt's forms - please give her a call @ [phone]

## 2023-04-17 NOTE — Telephone Encounter (Signed)
Patient called stating the FMLA form has him out until 8/25, he thought it was until 7/25.  He states he can't miss that much time from work.  Please advise.

## 2023-04-17 NOTE — Telephone Encounter (Signed)
Patient called again to follow-up on his FMLA forms.

## 2023-04-18 ENCOUNTER — Other Ambulatory Visit: Payer: Self-pay | Admitting: *Deleted

## 2023-04-18 DIAGNOSIS — R55 Syncope and collapse: Secondary | ICD-10-CM

## 2023-04-19 ENCOUNTER — Encounter: Payer: Self-pay | Admitting: Neurology

## 2023-05-02 ENCOUNTER — Encounter: Payer: Self-pay | Admitting: Cardiovascular Disease

## 2023-05-02 ENCOUNTER — Telehealth: Payer: Self-pay | Admitting: Nurse Practitioner

## 2023-05-02 ENCOUNTER — Ambulatory Visit: Payer: 59 | Attending: Cardiovascular Disease | Admitting: Cardiovascular Disease

## 2023-05-02 VITALS — BP 156/100 | HR 120 | Ht 74.0 in | Wt 220.6 lb

## 2023-05-02 DIAGNOSIS — R002 Palpitations: Secondary | ICD-10-CM

## 2023-05-02 NOTE — Telephone Encounter (Signed)
New Message:       Patient says he needs a note to return to work on Monday(05-06-23). He said he saw Dr Nelly Laurence today(05-02-23). Dr Nelly Laurence saqid that [name] is the one that needs to release him to return to work.

## 2023-05-02 NOTE — Progress Notes (Signed)
Electrophysiology Office Note:    Date:  05/02/2023   ID:  Gary Wyatt, DOB 28-Jun-1971, MRN [id number]  PCP:  [name], MD   [facility] Providers Cardiologist:  [name], MD     Referring MD: [name], NP   History of Present Illness:    Gary Wyatt is a 52 y.o. male with a medical history significant for narcolepsy, ministrokes, type 2 diabetes, mild nonobstructive coronary artery disease referred for evaluation of recurrent syncope.     He has a history of transient episodes. He had 1 this past Saturday --he awoke partially and noted that his right side was weak.  He was unable to move his right arm for several minutes.  This resolved spontaneously.  He did not go to the hospital.  Also recently, he had an episode as he was walking around in his yard.  He could not open his eyes and was not able to walk.  He did not completely lose consciousness, did not have weakness, and did not fall.  In fact, he reports that he has never actually lost consciousness during these episodes he just loses bodily control to a certain extent.  He has not fallen or lost postural tone during episodes.  He notes that he was diagnosed with narcolepsy as a teenager, and feels that these events are attributable to his narcolepsy.  He reports that he has had mini strokes in the past.     Today, he reports that he is doing very well.  He is very eager to return to work.   EKGs/Labs/Other Studies Reviewed Today:    Echocardiogram:  TTE November 09, 2022 EF 60 to 65%.  Grade 1 diastolic dysfunction.  No significant valvular disease.   Monitors:  14-day ZIO February, 2024 my interpretation Sinus rhythm 54 to 136 bpm, average 86 bpm Brief episodes of SVT occurred.  No pauses or block. There were multiple patient triggered events associated with sinus rhythm  Stress testing:   Advanced imaging:  Coronary CTA 10/17/2022 Mild nonobstructive CAD  Cardiac  catherization   EKG:   EKG Interpretation Date/Time:  Thursday May 02 2023 08:24:09 EDT Ventricular Rate:  120 PR Interval:  142 QRS Duration:  68 QT Interval:  316 QTC Calculation: 446 R Axis:   -34  Text Interpretation: Sinus tachycardia Left axis deviation Cannot rule out Anterior infarct , age undetermined When compared with ECG of 04-Feb-2023 02:03, LAD is new; tachycardia is new Confirmed by York Pellant [phone]) on 05/02/2023 8:48:11 AM     Physical Exam:    VS:  BP (!) 156/100   Pulse (!) 120   Ht 6\' 2"  (1.88 m)   Wt 220 lb 9.6 oz (100.1 kg)   SpO2 96%   BMI 28.32 kg/m     Wt Readings from Last 3 Encounters:  05/02/23 220 lb 9.6 oz (100.1 kg)  04/02/23 221 lb 3.2 oz (100.3 kg)  02/04/23 225 lb 12.8 oz (102.4 kg)     GEN: Well nourished, well developed in no acute distress CARDIAC: RRR, no murmurs, rubs, gallops RESPIRATORY:  Normal work of breathing MUSCULOSKELETAL: no edema    ASSESSMENT & PLAN:    Cataplexy? These episodes do not seem to be syncopal He has not had loss of consciousness, loss of postural tone He has had focal weakness and and a feeling that he is asleep This would be a very atypical presentation of an arrhythmia He has an appointment with neurology in  a few weeks If no other cause is identified, we may proceed with loop recorder  TIAs?  He has a history of recurrent TIAs This may be an indication for loop recorder to evaluate for AF    Signed, Maurice Small, MD  05/02/2023 9:16 AM    [facility]

## 2023-05-02 NOTE — Telephone Encounter (Signed)
Called and spoke with patient and got clarification.  Received call stating that patient is requesting a return to work note for Monday, May 06, 2023.  I reviewed Dr. Morrie Sheldon note from office visit today.  I have been following patient since December 2023 and past several office visits with me patient has described episodes of syncope with brief "loss of consciousness," however today with Dr. Nelly Laurence he described episodes as where, "In fact, he reports that he has never actually lost consciousness during these episodes he just loses bodily control to a certain extent. He has not fallen or lost postural tone during episodes. He notes that he was diagnosed with narcolepsy as a teenager, and feels that these events are attributable to his narcolepsy."  I spoke with him on the phone today and asked for clarification with this and he confirms this with me that he has not had LOC, and states that he wonders if this is related to his narcolepsy history/or possible mini strokes.   Dr. Morrie Sheldon progress note states that patient's episodes do not seem to be syncopal as he has not had loss of consciousness.  He stated if no other cause was identified after evaluation with neurology, okay to proceed with loop recorder to evaluate for AF as he has a history of recurrent TIA's.   Cardiology workup reviewed with patient on phone.  Mild nonobstructive CAD with no acute extracardiac findings on CCTA 10/2022.  Echocardiogram 11/2022 benign.  Carotid doppler revealed left ICA stenosis wound 1 to 39%, near normal right ICA, overall benign. Workup overall unremarkable. Due to overall reassuring cardiac workup, will write return to work note for patient and discussed strict ED precautions. He verbalized understanding. Discussed he will also need to get further evaluation from Neuro and PCP before returning to work. He verbalized understanding and was appreciative of my call.   [name], NP

## 2023-05-02 NOTE — Patient Instructions (Signed)
Medication Instructions:  Your physician recommends that you continue on your current medications as directed. Please refer to the Current Medication list given to you today. *If you need a refill on your cardiac medications before your next appointment, please call your pharmacy*   Follow-Up: At [facility], you and your health needs are our priority.  As part of our continuing mission to provide you with exceptional heart care, we have created designated Provider Care Teams.  These Care Teams include your primary Cardiologist (physician) and Advanced Practice Providers (APPs -  Physician Assistants and Nurse Practitioners) who all work together to provide you with the care you need, when you need it.  We recommend signing up for the patient portal called "MyChart".  Sign up information is provided on this After Visit Summary.  MyChart is used to connect with patients for Virtual Visits (Telemedicine).  Patients are able to view lab/test results, encounter notes, upcoming appointments, etc.  Non-urgent messages can be sent to your provider as well.   To learn more about what you can do with MyChart, go to [email or web address].    Your next appointment:   Second half of August - after your have followed up with neurology   Provider:   York Pellant, MD

## 2023-05-22 ENCOUNTER — Ambulatory Visit (INDEPENDENT_AMBULATORY_CARE_PROVIDER_SITE_OTHER): Payer: 59 | Admitting: Neurology

## 2023-05-22 ENCOUNTER — Encounter: Payer: Self-pay | Admitting: Neurology

## 2023-05-22 VITALS — BP 155/99 | HR 104 | Ht 74.0 in | Wt 223.6 lb

## 2023-05-22 DIAGNOSIS — R2990 Unspecified symptoms and signs involving the nervous system: Secondary | ICD-10-CM

## 2023-05-22 NOTE — Patient Instructions (Addendum)
Good to meet you.  Schedule MRI brain with and without contrast  2. Schedule 1-hour EEG. If normal, we will plan for a 3-day EEG to capture the episodes and see what the brain waves show during an event  3. Follow-up after tests, call for any changes

## 2023-05-22 NOTE — Progress Notes (Signed)
NEUROLOGY CONSULTATION NOTE  Gary Wyatt MRN: [id number] DOB: 05/01/1971  Referring provider: [name], NP Primary care provider: Dr. Doreen Beam  Reason for consult:  syncope   Thank you for your kind referral of Gary Wyatt for consultation of the above symptoms. Although his history is well known to you, please allow me to reiterate it for the purpose of our medical record. He is alone in the office today. Records and images were personally reviewed where available.   HISTORY OF PRESENT ILLNESS: This is a pleasant 52 year old right-handed man with a history of hypertension, DM2, nephrolithiasis, anxiety, depression, narcolepsy diagnosed at age 63 on Methylphenidate, presenting for evaluation of syncope. On further questioning, he states that around 2-3 months ago, he started having episodes where he would "just nod off and come right back." He thinks it is coming from his narcolepsy. He does state he loses time with these episodes, he works 3rd shift and takes Ritalin at 10pm and 4am, but would still be told he "nodded off." He also reports nocturnal episodes different from these. In 09/2022, his girlfriend noticed he was tossing in bed a lot more and then he woke up with the inside of his mouth being "chewed up" and felt very fatigued. He reports having another similar episode last month, he woke up again with his mouth chewed up and blood on his pillow, feeling exhausted. He would not be able to talk, he can get words out like his fiancee's name but they won't come out right. She says he looks confused, and he wakes up not knowing where he is. He has episodes suggestive of sleep paralysis occurring around twice a month where he could not lift his arms or roll over, he feels like he had a seizure, last episode was 3-4 weeks ago. His fiancee says his hands or legs jump, waking her up from sleep. He denies any loss of postural tone. There have been a couple of hypnopompic hallucinations  where he thought he saw his mom when waking up. He denies any staring episodes, olfactory/gustatory hallucinations, rising epigastric sensation. He has numbness in the balls of his feet due to diabetes. He reports hand tremors.   He states he has no prior history of headaches but in the past 3-4 weeks he has had a headache at least once a day with pressure over the temporal and frontal regions. No nausea/vomiting, visual obscurations, photo/phonosensitivity. He has neck pain. He gets dizzy after taking medications. No diplopia, dysarthria/dysphagia, back pain, bowel/bladder dysfunction. He had a concussion 6 years ago with loss of consciousness, no neurosurgical procedures. He had a normal birth and early development.  There is no history of febrile convulsions, CNS infections such as meningitis/encephalitis, or family history of seizures. He works in a [employer]. He usually gets 4 hours of sleep, gets up to cook dinner for his fiancee then sleeps for another 3 hours. He notes these episodes tend to happen more when he gets less sleep. He occasionally drinks alcohol. Memory is terrible, this is chronic but worse since episodes started.     PAST MEDICAL HISTORY: Past Medical History:  Diagnosis Date   Anxiety    Depression    Diabetes mellitus (HCC)    type 2   Hypertension    Insomnia    Kidney stone    Narcolepsy    TIA (transient ischemic attack)     PAST SURGICAL HISTORY: Past Surgical History:  Procedure Laterality Date  BIOPSY  07/25/2021   Procedure: BIOPSY;  Surgeon: Lanelle Bal, DO;  Location: [facility];  Service: Endoscopy;;   ESOPHAGOGASTRODUODENOSCOPY (EGD) WITH PROPOFOL N/A 07/25/2021   Procedure: ESOPHAGOGASTRODUODENOSCOPY (EGD) WITH PROPOFOL;  Surgeon: Lanelle Bal, DO;  Location: [facility];  Service: Endoscopy;  Laterality: N/A;  7:30am   NO PAST SURGERIES      MEDICATIONS: Current Outpatient Medications on File Prior to Visit  Medication Sig  Dispense Refill   ALPRAZolam (XANAX) 0.5 MG tablet Take 0.5 mg by mouth 2 (two) times daily as needed for anxiety.     amLODipine (NORVASC) 5 MG tablet Take 1 tablet (5 mg total) by mouth daily. 90 tablet 1   aspirin 81 MG EC tablet Take 81 mg by mouth daily. Swallow whole.     atorvastatin (LIPITOR) 20 MG tablet Take 1 tablet (20 mg total) by mouth daily. 30 tablet 6   chlorthalidone (HYGROTON) 25 MG tablet Take 25 mg by mouth daily.     HYDROcodone-acetaminophen (NORCO) 5-325 MG tablet Take 1 tablet by mouth every 8 (eight) hours as needed for moderate pain. 10 tablet 0   hydrOXYzine (ATARAX) 25 MG tablet Take 25-50 mg by mouth at bedtime as needed (sleep).     isosorbide mononitrate (IMDUR) 30 MG 24 hr tablet Take 1.5 tablets (45 mg total) by mouth daily. 135 tablet 1   methylphenidate (RITALIN) 20 MG tablet Take 20 mg by mouth 2 (two) times daily.      metoprolol tartrate (LOPRESSOR) 25 MG tablet Take 1 tablet (25 mg total) by mouth 2 (two) times daily. 180 tablet 1   nitroGLYCERIN (NITROSTAT) 0.4 MG SL tablet Place 0.4 mg under the tongue every 5 (five) minutes x 3 doses as needed for chest pain (if no relief after 3rd dose, proceed to ED or call 911).     OZEMPIC, 0.25 OR 0.5 MG/DOSE, 2 MG/3ML SOPN Inject 0.25 mg into the skin every Friday.     pantoprazole (PROTONIX) 40 MG tablet 40 Mg tablet BID for two weeks,then reduce back to 40 Mg tablet daily 44 tablet 0   sildenafil (VIAGRA) 100 MG tablet Take 100 mg by mouth daily as needed for erectile dysfunction.     TRESIBA FLEXTOUCH 200 UNIT/ML FlexTouch Pen Inject 32 Units into the skin at bedtime.     valsartan (DIOVAN) 320 MG tablet Take 160 mg by mouth daily.     alum & mag hydroxide-simeth (MAALOX MAX) 400-400-40 MG/5ML suspension Take 5 mLs by mouth every 6 (six) hours as needed for indigestion. (Patient not taking: Reported on 05/22/2023) 355 mL 0   Current Facility-Administered Medications on File Prior to Visit  Medication Dose Route  Frequency Provider Last Rate Last Admin   acetaminophen (TYLENOL) tablet 975 mg  975 mg Oral BID PRN [name], NP        ALLERGIES: Allergies  Allergen Reactions   Demerol [Meperidine] Itching and Other (See Comments)    irritation    FAMILY HISTORY: Family History  Problem Relation Age of Onset   Hypertension Mother    Diabetes Father    CAD Father    Prostate cancer Father    Colon cancer Neg Hx     SOCIAL HISTORY: Social History   Socioeconomic History   Marital status: Divorced    Spouse name: Not on file   Number of children: Not on file   Years of education: Not on file   Highest education level: Not on file  Occupational History   Not on file  Tobacco Use   Smoking status: Never    Passive exposure: Never   Smokeless tobacco: Current    Types: Snuff  Vaping Use   Vaping status: Never Used  Substance and Sexual Activity   Alcohol use: Yes    Comment: occas   Drug use: No   Sexual activity: Not on file  Other Topics Concern   Not on file  Social History Narrative   Are you right handed or left handed? Right    Are you currently employed ? 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 No    Who lives with you? Fiance    What type of home do you live in: 1 story or 2 story?  1 story        Social Determinants of Corporate investment banker Strain: Not on file  Food Insecurity: Not on file  Transportation Needs: Not on file  Physical Activity: Not on file  Stress: Not on file  Social Connections: Unknown (02/20/2022)   Received from [facility]   Social Network    Social Network: Not on file  Intimate Partner Violence: Unknown (01/12/2022)   Received from [facility]   HITS    Physically Hurt: Not on file    Insult or Talk Down To: Not on file    Threaten Physical Harm: Not on file    Scream or Curse: Not on file     PHYSICAL EXAM: Vitals:   05/22/23 1006  BP: (!) 155/99  Pulse: (!) 104   SpO2: 96%   General: No acute distress Head:  Normocephalic/atraumatic Skin/Extremities: No rash, no edema Neurological Exam: Mental status: alert and oriented to person, place, and time, no dysarthria or aphasia, Fund of knowledge is appropriate.  Recent and remote memory are intact, 3/3 delayed recall.  Attention and concentration are normal, 5/5 WORLD backwards.  Cranial nerves: CN I: not tested CN II: pupils equal, round, visual fields intact CN III, IV, VI:  full range of motion, no nystagmus, no ptosis CN V: facial sensation intact CN VII: upper and lower face symmetric CN VIII: hearing intact to conversation Bulk & Tone: normal, no fasciculations. Motor: 5/5 throughout with no pronator drift. Sensation: intact to light touch, cold, pin on both UE, decreased cold on right LE, intact pin and vibration sense on both LE. Romberg test negative Deep Tendon Reflexes: +1 throughout Cerebellar: no incoordination on finger to nose testing Gait: narrow-based and steady, able to tandem walk adequately. Tremor: none   IMPRESSION: This is a pleasant 52 year old right-handed man with a history of hypertension, DM2, nephrolithiasis, anxiety, depression, narcolepsy diagnosed at age 55 on Methylphenidate, presenting for evaluation of syncope. On further questioning, he reports 3 types of events, there are times he would "nod off" but he would lose time, he has had 2 episodes of waking up feeling exhausted with blood on pillow/mouth "chewed up," and episodes suggestive of sleep paralysis. He is also having new onset headaches. Etiology unclear, some may relate to the narcolepsy, however seizures are also a consideration with the nocturnal episodes with mouth injury. MRI brain with and without contrast and 1-hour EEG will be ordered. If normal, we will do a 72-hour EEG to further classify episodes. He may need a sleep study with MSLT in the future.  [place] driving laws were discussed with the patient, and  he knows  to stop driving after an episode of loss of awareness/consciousness until 6 months event-free. Follow-up after tests, call for any changes.    Thank you for allowing me to participate in the care of this patient. Please do not hesitate to call for any questions or concerns.   Patrcia Dolly, M.D.  CC: [name], NP, Dr. Sherril Croon

## 2023-05-23 ENCOUNTER — Ambulatory Visit (INDEPENDENT_AMBULATORY_CARE_PROVIDER_SITE_OTHER): Payer: 59 | Admitting: Neurology

## 2023-05-23 DIAGNOSIS — R2990 Unspecified symptoms and signs involving the nervous system: Secondary | ICD-10-CM

## 2023-05-24 NOTE — Procedures (Signed)
ELECTROENCEPHALOGRAM REPORT  Date of Study: 05/23/2023  Patient's Name: Gary Wyatt MRN: [id number] Date of Birth: 12/31/1980  Referring Provider: Dr. Patrcia Dolly  Clinical History: This is a 52 year old man with recurrent episodes of "nodding off," waking up with mouth injury. EEG for classification.  Medications: Xanax, Ritalin, Valsartan, aspirin, Lipitor, Evaristo Bury, Lopressor, Hygroton, Norvasc  Technical Summary: A multichannel digital 1-hour EEG recording measured by the international 10-20 system with electrodes applied with paste and impedances below 5000 ohms performed in our laboratory with EKG monitoring in an awake and asleep patient.  Hyperventilation was not performed. Photic stimulation was performed.  The digital EEG was referentially recorded, reformatted, and digitally filtered in a variety of bipolar and referential montages for optimal display.    Description: The patient is awake and asleep during the recording.  During maximal wakefulness, there is a symmetric, medium voltage 8.5-9 Hz posterior dominant rhythm that attenuates with eye opening.  The record is symmetric.  During drowsiness and sleep, there is an increase in theta slowing of the background.  Vertex waves and symmetric sleep spindles were seen. Photic stimulation did not elicit any abnormalities.  There were no epileptiform discharges or electrographic seizures seen.    EKG lead was unremarkable.  Impression: This 1-hour awake and asleep EEG is normal.    Clinical Correlation: A normal EEG does not exclude a clinical diagnosis of epilepsy.  If further clinical questions remain, prolonged EEG may be helpful.  Clinical correlation is advised.   Patrcia Dolly, M.D.

## 2023-05-27 ENCOUNTER — Ambulatory Visit: Payer: 59 | Attending: Nurse Practitioner | Admitting: Nurse Practitioner

## 2023-05-27 ENCOUNTER — Encounter: Payer: Self-pay | Admitting: Nurse Practitioner

## 2023-05-27 VITALS — BP 180/101 | HR 101 | Wt 220.0 lb

## 2023-05-27 DIAGNOSIS — R079 Chest pain, unspecified: Secondary | ICD-10-CM | POA: Diagnosis not present

## 2023-05-27 DIAGNOSIS — E785 Hyperlipidemia, unspecified: Secondary | ICD-10-CM

## 2023-05-27 DIAGNOSIS — I10 Essential (primary) hypertension: Secondary | ICD-10-CM

## 2023-05-27 DIAGNOSIS — R2990 Unspecified symptoms and signs involving the nervous system: Secondary | ICD-10-CM

## 2023-05-27 DIAGNOSIS — I25119 Atherosclerotic heart disease of native coronary artery with unspecified angina pectoris: Secondary | ICD-10-CM | POA: Diagnosis not present

## 2023-05-27 DIAGNOSIS — R42 Dizziness and giddiness: Secondary | ICD-10-CM

## 2023-05-27 MED ORDER — METOPROLOL TARTRATE 50 MG PO TABS: 50.0000 mg | ORAL_TABLET | Freq: Two times a day (BID) | ORAL | 1 refills | Status: DC

## 2023-05-27 NOTE — Addendum Note (Signed)
Addended by: Sharen Hones on: 05/27/2023 09:40 AM   Modules accepted: Orders

## 2023-05-27 NOTE — Progress Notes (Signed)
Virtual Visit via Telephone Note   Because of Gary Wyatt's co-morbid illnesses, he is at least at moderate risk for complications without adequate follow up.  This format is felt to be most appropriate for this patient at this time.  The patient did not have access to video technology/had technical difficulties with video requiring transitioning to audio format only (telephone).  All issues noted in this document were discussed and addressed.  No physical exam could be performed with this format.  Please refer to the patient's chart for his consent to telehealth for [facility].    Date:  05/27/2023  ID:  Gary Wyatt, DOB 10-16-1970, MRN [id number] The patient was identified using 2 identifiers. Patient Location: Home Provider Location: Office/Clinic PCP:  [name], MD [facility] Providers Cardiologist:  [name], MD Electrophysiologist:  Maurice Small, MD     Evaluation Performed:  Follow-Up Visit  Chief Complaint:  Here for follow-up  History of Present Illness:    Gary Wyatt is a 52 y.o. male with a PMH of  mild, nonobstructive CAD, hx chest pain, hx of of TIA, HTN, T2DM, aortic dilatation, and GERD, who presents today for follow-up.    I have been following him since 09/2022. Visit 09/2022 included multiple concerns. Most recently, he has had a hx of chest pain, SHOB,and syncopal episodes of unclear etiology. Work-up unremarkable and noted below.    Last seen by me on February 04, 2023. Reviewed past ED visits, was recommended he follow-up with GI with what sounded like GI symptoms contributing to chest pain as workup was unremarkable. Was instructed to follow-up with Neurology as scheduled for syncope.    Saw him for follow-up on April 02, 2023. Noted similar, chronic/intermittent chest pain from past OV, described as tightness along lower chest and upper abdomen. Did not follow up with GI as previously recommended. Admitted to 2-3 more  additional episodes of what sounds like narcolepsy, unsure of triggers/causes, brief episodes of LOC associated with spells per his report - see telephone note dated May 02, 2023 . Denied any warning signs prior to events. Denied any shortness of breath, palpitations, presyncope, dizziness, orthopnea, PND, swelling or significant weight changes, acute bleeding, or claudication.  Today he presents for telephone follow-up visit.  Denies any change to his symptoms from previous office visits.  It appears he has not followed up with GI as previously recommended. Denies any shortness of breath, palpitations, syncope, presyncope, dizziness, orthopnea, PND, swelling or significant weight changes, acute bleeding, or claudication.  SBP averages 140-180, DBP averages 90-100.    SH: Works third shift at [employer]. Denies any tobacco use.   ROS:   Please see the history of present illness.    All other systems reviewed and are negative.   Prior CV studies:   The following studies were reviewed today:  Cardiac monitor 12/06/2022: Patient had a min HR of 54 bpm, max HR of 197 bpm, and avg HR of 86 bpm. Predominant underlying rhythm was Sinus Rhythm. 10 Supraventricular Tachycardia runs occurred, the run with the fastest interval lasting 9.6 secs with a max rate of 197 bpm, the  longest lasting 25.2 secs with an avg rate of 136 bpm. Isolated SVEs were rare (<1.0%), SVE Couplets were rare (<1.0%), and SVE Triplets were rare (<1.0%). Isolated VEs were rare (<1.0%), and no VE Couplets or VE Triplets were present.    Carotid duplex on 11/11/2022:  L ICA stenosis 1-39%  R ICA was near-normal with minimal wall thickening or plaque.    Echocardiogram on 11/09/2022:  1. Left ventricular ejection fraction, by estimation, is 60 to 65%. The  left ventricle has normal function. The left ventricle has no regional  wall motion abnormalities. Left ventricular diastolic parameters are  consistent with Grade I  diastolic  dysfunction (impaired relaxation). The average left ventricular global  longitudinal strain is -18.6 %. The global longitudinal strain is normal.   2. Right ventricular systolic function is normal. The right ventricular  size is normal.   3. The mitral valve is normal in structure. No evidence of mitral valve  regurgitation. No evidence of mitral stenosis.   4. The aortic valve is tricuspid. Aortic valve regurgitation is not  visualized. No aortic stenosis is present.   Comparison(s): Echocardiogram done 11/21/21 showed an EF of 55-60%.     Cardiac monitor results pending 10/23/2022   Coronary CTA on October 17, 2022: IMPRESSION: 1. Coronary calcium score of 14.8. This was 62nd percentile for age-, sex, and race-matched controls.   2. Normal coronary origin with left dominance.   3. Mild non-calcified plaque in the mid LAD (25-49%).   4. Mild calcified plaque in the distal LCX (25-49%).   5. Minimal non-calcified plaque (<25%) in the non-dominant RCA.   6.  No significant extracardiac findings.   RECOMMENDATIONS: 1. Mild non-obstructive CAD (25-49%). Consider non-atherosclerotic causes of chest pain. Consider preventive therapy and risk factor modification.   Echocardiogram on 12/19/2021:  1. Left ventricular ejection fraction, by estimation, is 55 to 60%. The  left ventricle has normal function. The left ventricle has no regional  wall motion abnormalities. Left ventricular diastolic parameters are  indeterminate. Normal global longitudinla   strain of -17.1%.   2. Right ventricular systolic function is normal. The right ventricular  size is normal. Tricuspid regurgitation signal is inadequate for assessing  PA pressure.   3. The mitral valve is grossly normal. Trivial mitral valve  regurgitation.   4. The aortic valve is tricuspid. Aortic valve regurgitation is not  visualized. No aortic stenosis is present. Aortic valve mean gradient  measures 5.0 mmHg.    5. The inferior vena cava is normal in size with greater than 50%  respiratory variability, suggesting right atrial pressure of 3 mmHg.   6. Aortic dilatation noted. There is borderline dilatation of the aortic  arch, measuring 35 mm.   Labs/Other Tests and Data Reviewed:    EKG: EKG is not ordered today.   Objective:    Vital Signs:  There were no vitals taken for this visit.   Due to nature of today's visit, a physical exam was unable to be performed.  ASSESSMENT & PLAN:    Mild nonobstructive CAD, chest pain, hyperlipidemia Continues to note stable and atypical CP not associated with exertion. Previous CCTA - mild nonobstructive CAD. Low risk NST in 2021.  Believe anxiety is contributing to his symptoms.  Continue current medication regimen. Increasing Lopressor to 50 mg BID. Heart healthy diet and regular cardiovascular exercise encouraged.  ED precautions discussed.  Recommended to follow-up with PCP and GI as previously recommended.  Continue to follow with PCP.   Hypertension Blood pressure elevated, SBP averaging 140-180, DBP averaging 90-100.  Increasing Lopressor to 50 mg twice daily, continue rest of medication regimen. Discussed to monitor BP at home at least 2 hours after medications and sitting for 5-10 minutes.  He will let us know if his SBP is not < 130  in next few weeks. Heart healthy diet and regular cardiovascular exercise encouraged.    Seizure?, narcolepsy? (Neurological symptoms), orthostatic dizziness Questionable past possible seizure witnessed by s/o, no recent recurrences. ? Narcolepsy, see previous telephone encounter on May 02, 2023. Etiology unclear. Recent cardiac workup unremarkable. Orthostatics did reveal drop in BP on standing previously, but not considered orthostatic hypotension. Previously advised and recommended patient to avoid driving at the current time for at least 6 months.  ED precautions discussed.  Discussed conservative measures for  orthostatic dizziness including wearing compression stockings, changing positions slowly, and staying adequately hydrated.  He verbalized understanding. Follow-up with Neuro and EP, may benefit from loop recorder for further evaluation.     Time:   Today, I have spent 6 minutes with the patient with telehealth technology discussing the above problems.     Medication Adjustments/Labs and Tests Ordered: Current medicines are reviewed at length with the patient today.  Concerns regarding medicines are outlined above.   Tests Ordered: No orders of the defined types were placed in this encounter.   Medication Changes: No orders of the defined types were placed in this encounter.   Follow Up:  In Person in 6 month(s)  Signed, [name], NP  05/27/2023 8:19 AM    [facility]

## 2023-05-27 NOTE — Patient Instructions (Addendum)
Medication Instructions:  Your physician has recommended you make the following change in your medication:  Increase Lopressoer to 50 Mg BID (twice Daily) (Rx Sent to Pharmacy)   Labwork: None  Testing/Procedures: None   Follow-Up: Your physician recommends that you schedule a follow-up appointment in: 6 Months with JB   Any Other Special Instructions Will Be Listed Below (If Applicable).  If you need a refill on your cardiac medications before your next appointment, please call your pharmacy.

## 2023-05-30 ENCOUNTER — Encounter: Payer: Self-pay | Admitting: Neurology

## 2023-05-31 ENCOUNTER — Other Ambulatory Visit: Payer: Self-pay

## 2023-05-31 DIAGNOSIS — R2990 Unspecified symptoms and signs involving the nervous system: Secondary | ICD-10-CM

## 2023-06-05 NOTE — Progress Notes (Unsigned)
  Electrophysiology Office Note:    Date:  06/05/2023   ID:  Gary Wyatt, DOB 16-Aug-1971, MRN [id number]  PCP:  [name], MD   [facility] Providers Cardiologist:  [name], MD Electrophysiologist:  Maurice Small, MD     Referring MD: [name], MD   History of Present Illness:    Gary Wyatt is a 52 y.o. male with a medical history significant for narcolepsy, ministrokes, type 2 diabetes, mild nonobstructive coronary artery disease referred for evaluation of recurrent syncope.     He has a history of transient episodes. He had 1 this past Saturday --he awoke partially and noted that his right side was weak.  He was unable to move his right arm for several minutes.  This resolved spontaneously.  He did not go to the hospital.  Also recently, he had an episode as he was walking around in his yard.  He could not open his eyes and was not able to walk.  He did not completely lose consciousness, did not have weakness, and did not fall.  In fact, he reports that he has never actually lost consciousness during these episodes he just loses bodily control to a certain extent.  He has not fallen or lost postural tone during episodes.  He notes that he was diagnosed with narcolepsy as a teenager, and feels that these events are attributable to his narcolepsy.  He reports that he has had mini strokes in the past.     Today, he reports that he is doing very well.  Since our last visit he was seen in neurology clinic   EKGs/Labs/Other Studies Reviewed Today:    Echocardiogram:  TTE November 09, 2022 EF 60 to 65%.  Grade 1 diastolic dysfunction.  No significant valvular disease.   Monitors:  14-day ZIO February, 2024 my interpretation Sinus rhythm 54 to 136 bpm, average 86 bpm Brief episodes of SVT occurred.  No pauses or block. There were multiple patient triggered events associated with sinus rhythm  Stress testing:   Advanced imaging:  Coronary CTA  10/17/2022 Mild nonobstructive CAD  Cardiac catherization   EKG:         Physical Exam:    VS:  There were no vitals taken for this visit.    Wt Readings from Last 3 Encounters:  05/27/23 220 lb (99.8 kg)  05/22/23 223 lb 9.6 oz (101.4 kg)  05/02/23 220 lb 9.6 oz (100.1 kg)     GEN: Well nourished, well developed in no acute distress CARDIAC: RRR, no murmurs, rubs, gallops RESPIRATORY:  Normal work of breathing MUSCULOSKELETAL: no edema    ASSESSMENT & PLAN:    Cataplexy? These episodes do not seem to be syncopal He has not had loss of consciousness, loss of postural tone He has had focal weakness and and a feeling that he is asleep This would be a very atypical presentation of an arrhythmia He has an appointment with neurology in a few weeks If no other cause is identified, we may proceed with loop recorder  TIAs?  He has a history of recurrent TIAs This may be an indication for loop recorder to evaluate for AF    Signed, Maurice Small, MD  06/05/2023 6:59 PM    [facility]

## 2023-06-06 ENCOUNTER — Encounter: Payer: Self-pay | Admitting: Cardiovascular Disease

## 2023-06-06 ENCOUNTER — Ambulatory Visit: Payer: 59 | Attending: Cardiovascular Disease | Admitting: Cardiovascular Disease

## 2023-06-06 VITALS — BP 170/98 | HR 89 | Ht 74.0 in | Wt 222.0 lb

## 2023-06-06 DIAGNOSIS — R079 Chest pain, unspecified: Secondary | ICD-10-CM

## 2023-06-06 NOTE — Patient Instructions (Signed)
Medication Instructions:  Your physician recommends that you continue on your current medications as directed. Please refer to the Current Medication list given to you today. *If you need a refill on your cardiac medications before your next appointment, please call your pharmacy*   Follow-Up: At [facility], you and your health needs are our priority.  As part of our continuing mission to provide you with exceptional heart care, we have created designated Provider Care Teams.  These Care Teams include your primary Cardiologist (physician) and Advanced Practice Providers (APPs -  Physician Assistants and Nurse Practitioners) who all work together to provide you with the care you need, when you need it.  We recommend signing up for the patient portal called "MyChart".  Sign up information is provided on this After Visit Summary.  MyChart is used to connect with patients for Virtual Visits (Telemedicine).  Patients are able to view lab/test results, encounter notes, upcoming appointments, etc.  Non-urgent messages can be sent to your provider as well.   To learn more about what you can do with MyChart, go to [email or web address].    Your next appointment:   Patient will call to schedule appointment after he has followed up with neurologist   Provider:   York Pellant, MD

## 2023-06-28 ENCOUNTER — Other Ambulatory Visit: Payer: 59

## 2023-07-02 ENCOUNTER — Telehealth: Payer: 59 | Admitting: Nurse Practitioner

## 2023-07-13 ENCOUNTER — Other Ambulatory Visit: Payer: 59

## 2023-07-15 ENCOUNTER — Other Ambulatory Visit: Payer: Self-pay | Admitting: Nurse Practitioner

## 2023-07-15 IMAGING — CT CT ABD-PELV W/O CM
2 of 4 series · 16 of 46 positions shown, 18 images · non-contrast
Comparison: CT abdomen pelvis 06/05/2016

CLINICAL DATA: Right flank since [REDACTED] of last week History of
kidney stone 8 years ago



[Series 2: axial st · axial · 0.83mm/px · z∈[+1058,+1508]mm · 13 of 102 slices shown, 15 images]
[im 6/102  soft-tissue]
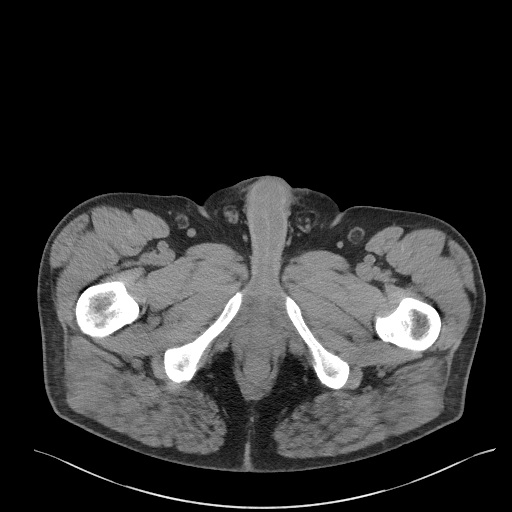
[im 6/102  bone]
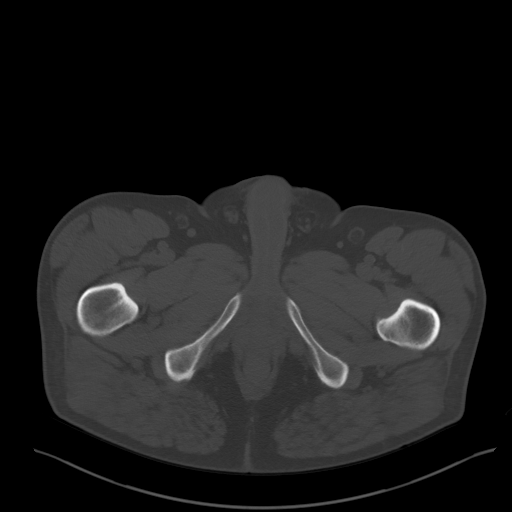
[im 16/102  soft-tissue]
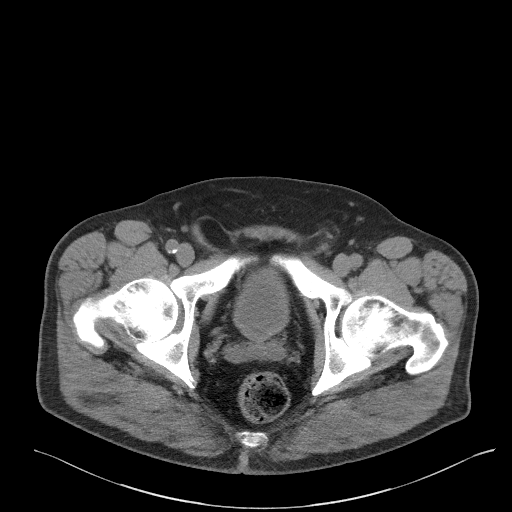
[im 22/102  soft-tissue]
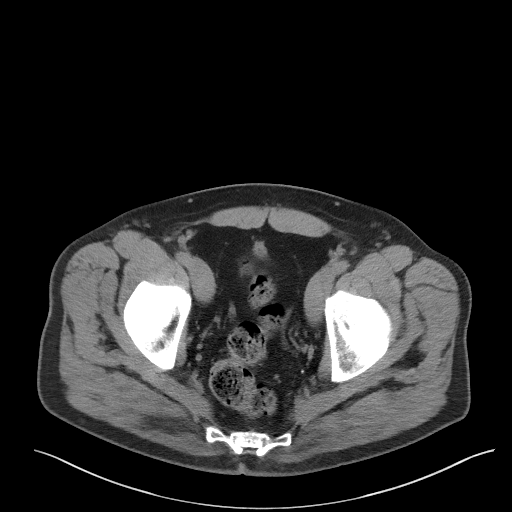
[im 27/102  soft-tissue]
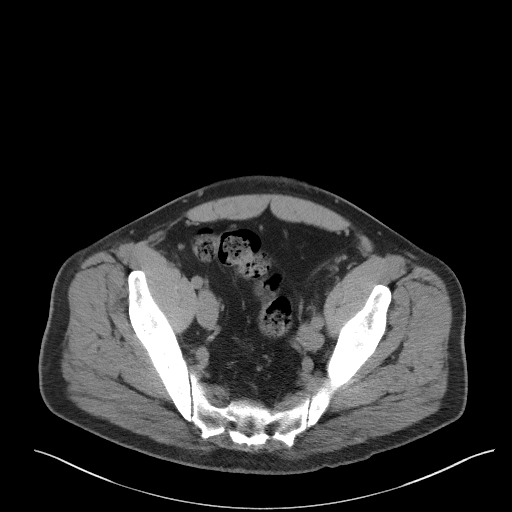
[im 38/102  soft-tissue]
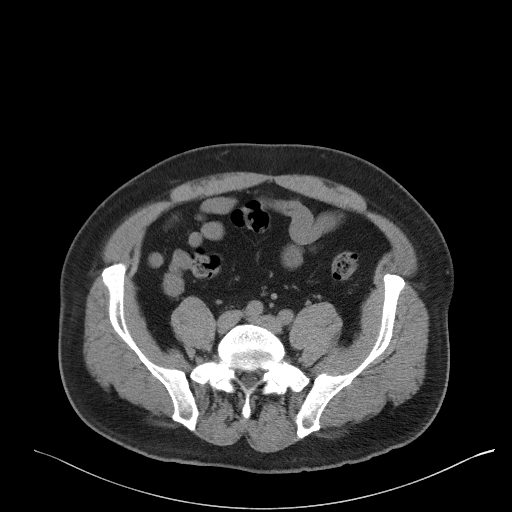
[im 43/102  soft-tissue]
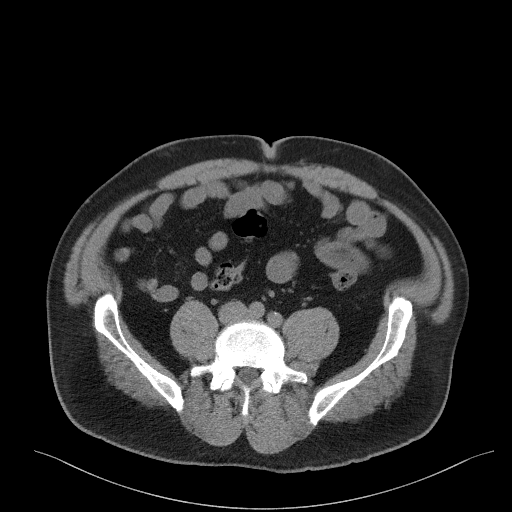
[im 54/102  soft-tissue]
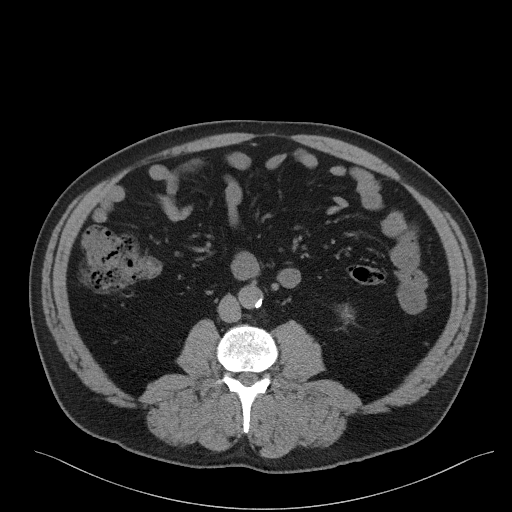
[im 59/102  soft-tissue]
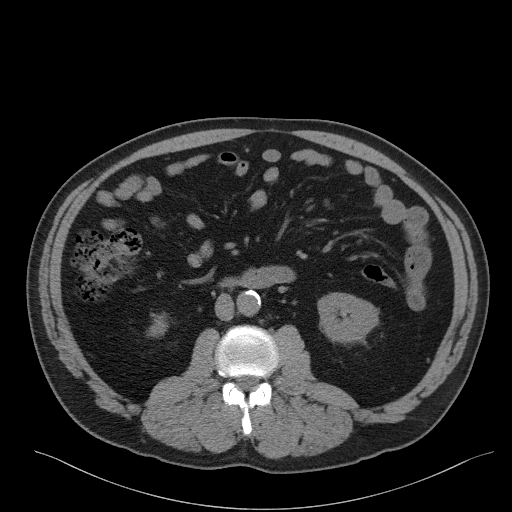
[im 64/102  soft-tissue]
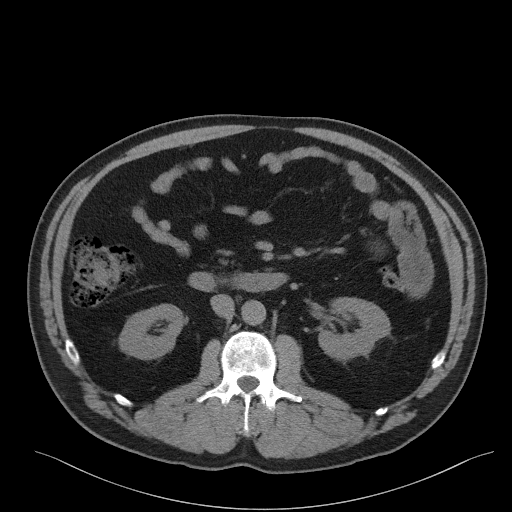
[im 64/102  bone]
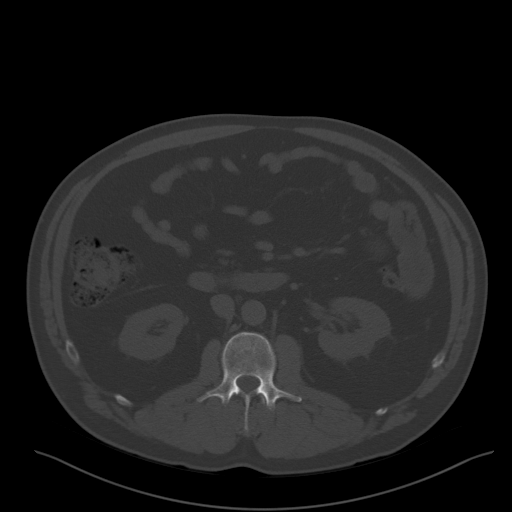
[im 75/102  soft-tissue]
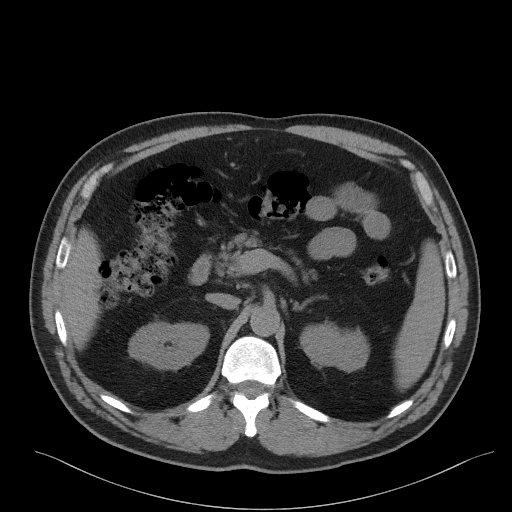
[im 80/102  soft-tissue]
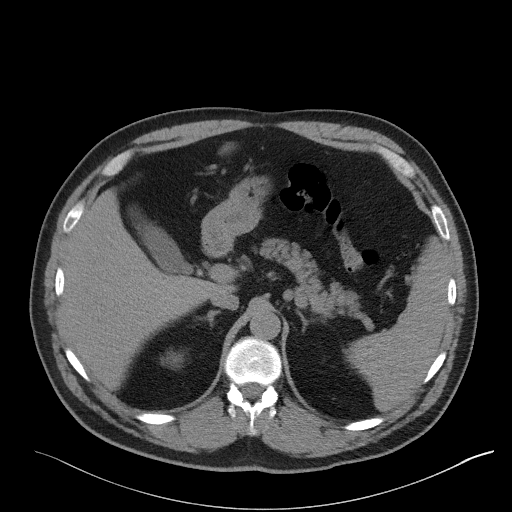
[im 86/102  soft-tissue]
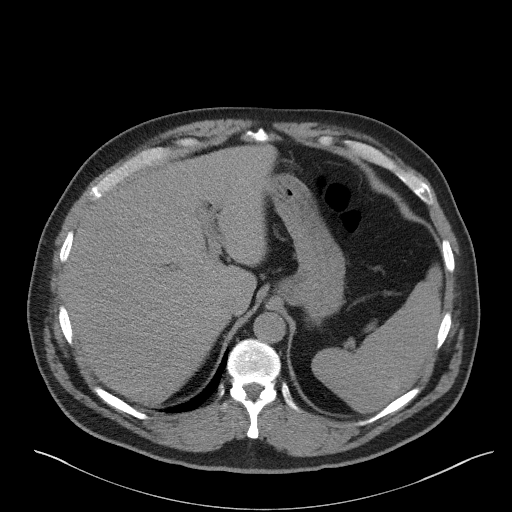
[im 96/102  soft-tissue]
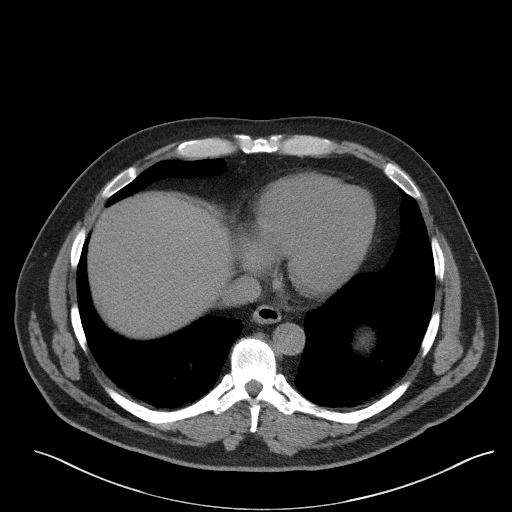

[Series 5: coronal st · coronal · 0.86mm/px · 3 of 101 slices shown]
[im 34/101  soft-tissue]
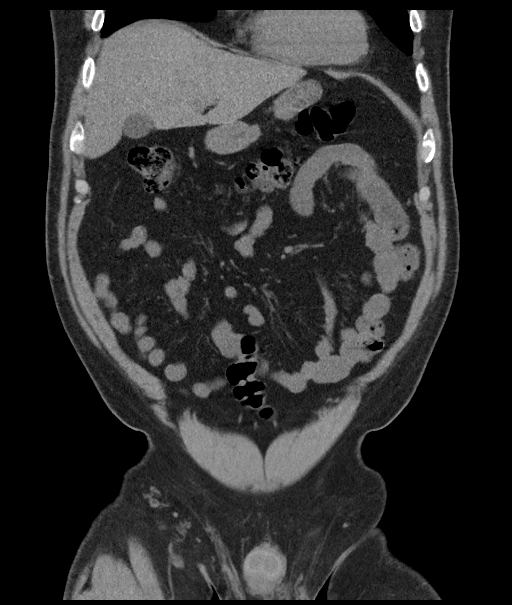
[im 45/101  soft-tissue]
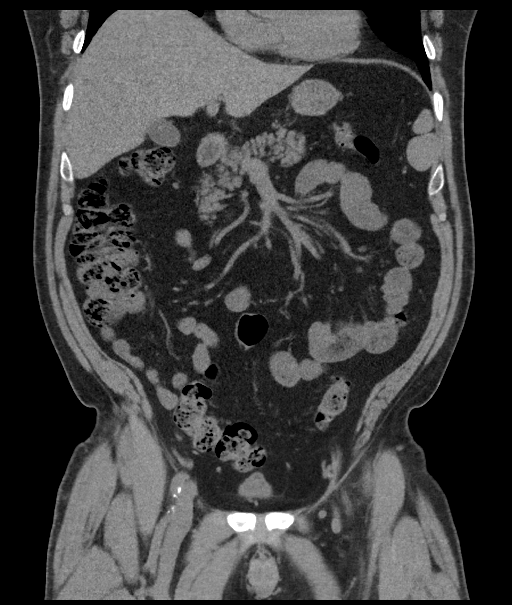
[im 56/101  soft-tissue]
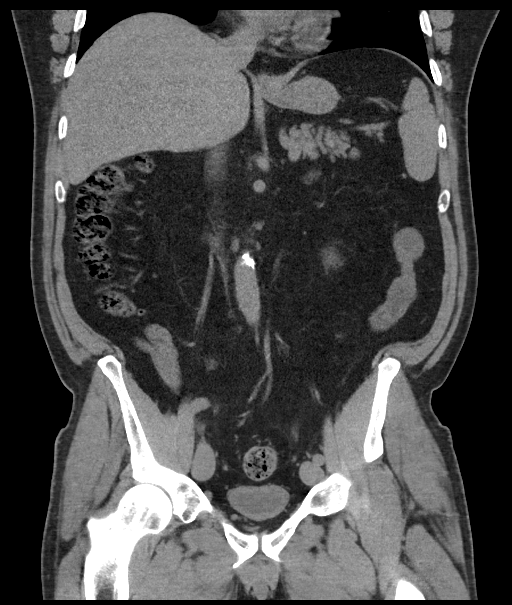

[16 of 46 positions shown; findings below may reference images not displayed]

FINDINGS: Lower chest: No acute abnormality.

Evaluation of the abdominal viscera is limited by the lack of IV
contrast.

Hepatobiliary: No focal liver abnormality is seen. Gallbladder
appears normal.

Pancreas: Unremarkable.  No surrounding inflammatory changes.

Spleen: Normal in size without focal abnormality.

Adrenals/Urinary Tract: Adrenal glands are unremarkable. Kidneys are
normal, without renal calculi, focal lesion, or hydronephrosis.
Bladder is unremarkable.

Stomach/Bowel: Stomach is within normal limits. Appendix appears
normal. No evidence of bowel wall thickening, distention, or
inflammatory changes.

Vascular/Lymphatic: Aortic atherosclerosis. No enlarged abdominal or
pelvic lymph nodes.

Reproductive: Mildly enlarged prostate measuring 5.2 cm in diameter.

Other: No abdominal wall hernia or abnormality. No abdominopelvic
ascites.

Musculoskeletal: No acute or significant osseous findings.
IMPRESSION: 1.  No evidence of renal calculi or obstructive uropathy.

2.  Mildly enlarged prostate.

3.  Aortic atherosclerosis.

## 2023-09-04 ENCOUNTER — Other Ambulatory Visit: Payer: Self-pay | Admitting: Nurse Practitioner

## 2023-11-07 ENCOUNTER — Encounter: Payer: Self-pay | Admitting: Family Medicine

## 2023-11-07 ENCOUNTER — Ambulatory Visit (INDEPENDENT_AMBULATORY_CARE_PROVIDER_SITE_OTHER): Payer: 59 | Admitting: Family Medicine

## 2023-11-07 VITALS — BP 148/89 | HR 87 | Temp 97.3°F | Ht 74.0 in | Wt 225.0 lb

## 2023-11-07 DIAGNOSIS — E1159 Type 2 diabetes mellitus with other circulatory complications: Secondary | ICD-10-CM

## 2023-11-07 DIAGNOSIS — G47429 Narcolepsy in conditions classified elsewhere without cataplexy: Secondary | ICD-10-CM | POA: Diagnosis not present

## 2023-11-07 DIAGNOSIS — Z8639 Personal history of other endocrine, nutritional and metabolic disease: Secondary | ICD-10-CM

## 2023-11-07 DIAGNOSIS — E1169 Type 2 diabetes mellitus with other specified complication: Secondary | ICD-10-CM

## 2023-11-07 DIAGNOSIS — I152 Hypertension secondary to endocrine disorders: Secondary | ICD-10-CM

## 2023-11-07 DIAGNOSIS — Z136 Encounter for screening for cardiovascular disorders: Secondary | ICD-10-CM | POA: Insufficient documentation

## 2023-11-07 DIAGNOSIS — Z794 Long term (current) use of insulin: Secondary | ICD-10-CM

## 2023-11-07 DIAGNOSIS — F1320 Sedative, hypnotic or anxiolytic dependence, uncomplicated: Secondary | ICD-10-CM

## 2023-11-07 LAB — BAYER DCA HB A1C WAIVED: HB A1C (BAYER DCA - WAIVED): 7.8 % — ABNORMAL HIGH (ref 4.8–5.6)

## 2023-11-07 MED ORDER — BLOOD GLUCOSE MONITORING SUPPL DEVI: 1.0000 | Freq: Three times a day (TID) | 0 refills | Status: DC

## 2023-11-07 MED ORDER — BLOOD GLUCOSE TEST VI STRP: 1.0000 | ORAL_STRIP | Freq: Three times a day (TID) | 0 refills | Status: DC

## 2023-11-07 MED ORDER — LANCETS MISC. MISC: 1.0000 | Freq: Three times a day (TID) | 0 refills | Status: DC

## 2023-11-07 MED ORDER — METFORMIN HCL 500 MG PO TABS: 500.0000 mg | ORAL_TABLET | Freq: Two times a day (BID) | ORAL | 0 refills | Status: AC

## 2023-11-07 MED ORDER — LANCET DEVICE MISC: 1.0000 | Freq: Three times a day (TID) | 0 refills | Status: DC

## 2023-11-07 NOTE — Progress Notes (Signed)
New Patient Office Visit  Subjective   Patient ID: Gary Wyatt, male    DOB: [birth year]  Age: 53 y.o. MRN: [id number]  CC:  Chief Complaint  Patient presents with   New Patient (Initial Visit)   HPI Gary Wyatt presents to establish care He was previously seeing primary care in [place], [place].  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 He does not smoke.  PMH includes uncontrolled HTN, Diabetes on insulin, GAD, and narcolepsy.   Hypertension associated with type 2 diabetes mellitus (HCC) Reports that he monitors his BP at home and at work. He averages 125-180/98-116. He is taking 320 mg of valsartan, 25 mg of chorthalidone, and 50 mg of metop BID. No longer taking amlodipine  Has not had renal artery [place] Has blurry vision, planning to see Eye doctor in [place] for annual screening for diabetes. He is established at [facility]. Endorses palpitations ~every 3 months. Controlled with metoprolol. He has established with Cardiology and EP. He was last seen by Nelly Laurence, MD 06/06/23. He reports BLE edema in the morning, wearing compression stockings. Resolves with elevation.  Endorses headaches. States that he knows his BP is high whenever he has a headache. States that it happens 2 times per week and he checks it at work it will 180/101. He has a medication at home that he takes as needed when BP is >150. Believes it to be labetalol  Type 2 diabetes mellitus with other specified complication, with long-term current use of insulin (HCC) Has lost BG monitor. Does not like GCM as it gave him a false alarm.  Taking Evaristo Bury, and ozempic has not missed any doses of this.  Has tried metformin in the past, but stopped it once he started ozempic. Does not believe that he had any side effects and is willing to restart it. He is worried for low BG with metformin, but is interested in getting off of insulin. He checks his BG in the mornings after 3rd shift. Last meal at 0300. BG averages 240-300, lowest at home  is 180. Taking Evaristo Bury ~9:30 pm   Generalized anxiety disorder States that he has been taking xanax for 5-6 years (since stressful life transition with divorce). States that he takes them once every few months. States that he does not really need a refill on it. He is willing to try other things if needed, but does not wish to start something today. Denies SI.   Narcolepsy   States that he was diagnosed at 53 years old. Works 3rd shift.  States that when he was diagnosed he completed sleep study in addition to completing psychiatric evaluation. Would like to stay on Ritalin  Takes it before work ~9:30 and at 0400. Is able to sleep through the day.  He is established with neurology.   ED  Continues to have ED  Using sildenafil as needed. States that this is working well for him.  Denies any side effects.   Outpatient Encounter Medications as of 11/07/2023  Medication Sig   ALPRAZolam (XANAX) 0.5 MG tablet Take 0.5 mg by mouth 2 (two) times daily as needed for anxiety.   alum & mag hydroxide-simeth (MAALOX MAX) 400-400-40 MG/5ML suspension Take 5 mLs by mouth every 6 (six) hours as needed for indigestion.   aspirin 81 MG EC tablet Take 81 mg by mouth daily. Swallow whole.   atorvastatin (LIPITOR) 20 MG tablet Take 1 tablet (20 mg total) by mouth daily.   B-D ULTRAFINE  III SHORT PEN 31G X 8 MM MISC SMARTSIG:1 pen needle SUB-Q Twice Daily   chlorthalidone (HYGROTON) 25 MG tablet Take 25 mg by mouth daily.   cyclobenzaprine (FLEXERIL) 10 MG tablet Take 10 mg by mouth 3 (three) times daily as needed.   HYDROcodone-acetaminophen (NORCO) 5-325 MG tablet Take 1 tablet by mouth every 8 (eight) hours as needed for moderate pain.   hydrOXYzine (ATARAX) 25 MG tablet Take 25-50 mg by mouth at bedtime as needed (sleep).   isosorbide mononitrate (IMDUR) 30 MG 24 hr tablet Take 1.5 tablets (45 mg total) by mouth daily.   meloxicam (MOBIC) 15 MG tablet Take 7.5-15 mg by mouth daily as needed.    methylphenidate (RITALIN) 20 MG tablet Take 20 mg by mouth 2 (two) times daily.    metoprolol tartrate (LOPRESSOR) 50 MG tablet Take 1 tablet (50 mg total) by mouth 2 (two) times daily.   nitroGLYCERIN (NITROSTAT) 0.4 MG SL tablet Place 0.4 mg under the tongue every 5 (five) minutes x 3 doses as needed for chest pain (if no relief after 3rd dose, proceed to ED or call 911).   pantoprazole (PROTONIX) 40 MG tablet Take 1 tablet (40 mg total) by mouth daily. TAKE ONE TABLET TWICE DAILY FOR 14 DAYS, THEN ONE DAILY   sildenafil (VIAGRA) 100 MG tablet Take 100 mg by mouth daily as needed for erectile dysfunction.   TRESIBA FLEXTOUCH 200 UNIT/ML FlexTouch Pen Inject 32 Units into the skin at bedtime.   valsartan (DIOVAN) 320 MG tablet TAKE 1/2 TABLET ONCE A DAY   [DISCONTINUED] OZEMPIC, 0.25 OR 0.5 MG/DOSE, 2 MG/3ML SOPN Inject 0.25 mg into the skin every Friday.   amLODipine (NORVASC) 10 MG tablet TAKE 1 TABLET DAILY IF BP IS GREATER THAN 140/90   baclofen (LIORESAL) 20 MG tablet Take 1 tablet by mouth 2 (two) times daily.   cloNIDine (CATAPRES) 0.1 MG tablet TAKE ONE TABLET TWICE DAILY AS NEEDED FOR BP OVER 170   hydrochlorothiazide (HYDRODIURIL) 12.5 MG tablet TAKE ONE TABLET DAILY AS NEEDED   ibuprofen (ADVIL) 800 MG tablet TAKE ONE TABLET THREE TIMES DAILY AS NEEDED FOR PAIN   loperamide (IMODIUM) 2 MG capsule TAKE ONE CAPSULE EVERY 6 HOURS AS NEEDED FOR DIARRHEA   metoprolol succinate (TOPROL-XL) 25 MG 24 hr tablet Take 1 tablet by mouth daily.   nabumetone (RELAFEN) 500 MG tablet Take 1 tablet by mouth 2 (two) times daily.   naproxen (NAPROSYN) 500 MG tablet Take 1 tablet by mouth 2 (two) times daily as needed.   OZEMPIC, 1 MG/DOSE, 4 MG/3ML SOPN INJECT ONE MG ONCE WEEKLY   valsartan-hydrochlorothiazide (DIOVAN-HCT) 320-12.5 MG tablet Take 1 tablet by mouth daily.   [DISCONTINUED] amLODipine (NORVASC) 5 MG tablet Take 1 tablet (5 mg total) by mouth daily.   Facility-Administered Encounter  Medications as of 11/07/2023  Medication   acetaminophen (TYLENOL) tablet 975 mg    Past Medical History:  Diagnosis Date   Anxiety    Depression    Diabetes mellitus (HCC)    type 2   Hypertension    Insomnia    Kidney stone    Narcolepsy    TIA (transient ischemic attack)     Past Surgical History:  Procedure Laterality Date   BIOPSY  07/25/2021   Procedure: BIOPSY;  Surgeon: Lanelle Bal, DO;  Location: [facility];  Service: Endoscopy;;   ESOPHAGOGASTRODUODENOSCOPY (EGD) WITH PROPOFOL N/A 07/25/2021   Procedure: ESOPHAGOGASTRODUODENOSCOPY (EGD) WITH PROPOFOL;  Surgeon: Lanelle Bal, DO;  Location: [facility];  Service: Endoscopy;  Laterality: N/A;  7:30am   NO PAST SURGERIES      Family History  Problem Relation Age of Onset   Hypertension Mother    Diabetes Father    CAD Father    Prostate cancer Father    Colon cancer Neg Hx     Social History   Socioeconomic History   Marital status: Divorced    Spouse name: Not on file   Number of children: Not on file   Years of education: Not on file   Highest education level: 12th grade  Occupational History   Not on file  Tobacco Use   Smoking status: Never    Passive exposure: Never   Smokeless tobacco: Current    Types: Snuff  Vaping Use   Vaping status: Never Used  Substance and Sexual Activity   Alcohol use: Yes    Comment: occas   Drug use: No   Sexual activity: Not on file  Other Topics Concern   Not on file  Social History Narrative   Are you right handed or left handed? Right    Are you currently employed ? 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 No    Who lives with you? Fiance    What type of home do you live in: 1 story or 2 story?  1 story        Social Drivers of Corporate investment banker Strain: Medium Risk (11/07/2023)   Overall Financial Resource Strain (CARDIA)    Difficulty of Paying Living Expenses: Somewhat hard  Food  Insecurity: No Food Insecurity (11/07/2023)   Hunger Vital Sign    Worried About Running Out of Food in the Last Year: Never true    Ran Out of Food in the Last Year: Never true  Recent Concern: Food Insecurity - Food Insecurity Present (11/07/2023)   Hunger Vital Sign    Worried About Running Out of Food in the Last Year: Sometimes true    Ran Out of Food in the Last Year: Sometimes true  Transportation Needs: No Transportation Needs (11/07/2023)   PRAPARE - Administrator, Civil Service (Medical): No    Lack of Transportation (Non-Medical): No  Physical Activity: Sufficiently Active (11/07/2023)   Exercise Vital Sign    Days of Exercise per Week: 5 days    Minutes of Exercise per Session: 30 min  Stress: No Stress Concern Present (11/07/2023)   [employer] of Occupational Health - Occupational Stress Questionnaire    Feeling of Stress : Not at all  Social Connections: Moderately Isolated (11/07/2023)   Social Connection and Isolation Panel [NHANES]    Frequency of Communication with Friends and Family: More than three times a week    Frequency of Social Gatherings with Friends and Family: More than three times a week    Attends Religious Services: More than 4 times per year    Active Member of [employer] or Organizations: No    Attends Engineer, structural: Not on file    Marital Status: Divorced  Intimate Partner Violence: Not At Risk (11/07/2023)   Humiliation, Afraid, Rape, and Kick questionnaire    Fear of Current or Ex-Partner: No    Emotionally Abused: No    Physically Abused: No    Sexually Abused: No    ROS As per HPI   Objective   BP (!) 148/89  Pulse 87   Temp (!) 97.3 F (36.3 C)   Ht 6\' 2"  (1.88 m)   Wt 225 lb (102.1 kg)   SpO2 95%   BMI 28.89 kg/m   Physical Exam Constitutional:      General: He is awake. He is not in acute distress.    Appearance: Normal appearance. He is well-developed, well-groomed and overweight. He is not  ill-appearing, toxic-appearing or diaphoretic.  Cardiovascular:     Rate and Rhythm: Normal rate and regular rhythm.     Pulses: Normal pulses.          Radial pulses are 2+ on the right side and 2+ on the left side.       Posterior tibial pulses are 2+ on the right side and 2+ on the left side.     Heart sounds: Normal heart sounds. No murmur heard.    No gallop.  Pulmonary:     Effort: Pulmonary effort is normal. No respiratory distress.     Breath sounds: Normal breath sounds. No stridor. No wheezing, rhonchi or rales.  Musculoskeletal:     Cervical back: Full passive range of motion without pain and neck supple.     Right lower leg: No edema.     Left lower leg: No edema.  Skin:    General: Skin is warm.     Capillary Refill: Capillary refill takes less than 2 seconds.  Neurological:     General: No focal deficit present.     Mental Status: He is alert, oriented to person, place, and time and easily aroused. Mental status is at baseline.     GCS: GCS eye subscore is 4. GCS verbal subscore is 5. GCS motor subscore is 6.     Motor: No weakness.  Psychiatric:        Attention and Perception: Attention and perception normal.        Mood and Affect: Mood and affect normal.        Speech: Speech normal.        Behavior: Behavior normal. Behavior is cooperative.        Thought Content: Thought content normal. Thought content does not include homicidal or suicidal ideation. Thought content does not include homicidal or suicidal plan.        Cognition and Memory: Cognition and memory normal.        Judgment: Judgment normal.       11/07/2023    8:44 AM  Depression screen PHQ 2/9  Decreased Interest 0  Down, Depressed, Hopeless 0  PHQ - 2 Score 0  Altered sleeping 0  Tired, decreased energy 2  Change in appetite 0  Feeling bad or failure about yourself  0  Trouble concentrating 0  Moving slowly or fidgety/restless 0  Suicidal thoughts 0  PHQ-9 Score 2      11/07/2023     8:45 AM  GAD 7 : Generalized Anxiety Score  Nervous, Anxious, on Edge 0  Control/stop worrying 2  Worry too much - different things 2  Trouble relaxing 0  Restless 0  Easily annoyed or irritable 0  Afraid - awful might happen 0  Total GAD 7 Score 4  Anxiety Difficulty Not difficult at all    Assessment & Plan:  1. Hypertension associated with type 2 diabetes mellitus (HCC) (Primary) Reviewed ETT from [facility] on 07/29/2020.  Reviewed Renal US from 09/26/2020  Reviewed cardiology notes from [place], MD 03/20/2021. Patient was switched from metoprolol to labetalol, which was  then stopped due to side effects. He experienced hypotension with symptoms. He was started on amlodipine 5 mg at that time. He increased to 10 mg amlodipine per notes on 08/22/2021 with Vincenza Hews, MD.  12/28/2022 Philis Nettle, NP stopped amlodipine and Toprol XL, started imdur and Lopressor 25 mg BID due to mild nonobstructive CAD, chest pain, and palpitations.  04/02/23 Philis Nettle, NP restarted amlodipine 5 and attempted PPI trial for chest pain.   Reviewed Zio report from [facility], MD on 05/02/2023 Reviewed notes from [facility], MD on 06/06/23, patient continuing evaluation with neurology and cardiology for possible arrhythmia, TIA, or cataplexy. Per note, patient to complete evaluation with neurology and then follow up with cardiology for possible loop recorder.  - CBC with Differential/Platelet - CMP14+EGFR - Microalbumin / creatinine urine ratio - US Renal Artery Stenosis; Future   [facility] RAD - 09/26/2020 10:28 PM EST  Normal renal ultrasound. No nephrolithiasis or obstructive uropathy.   Electronically Signed  By: Rise Mu M.D.  On: 09/26/2020 22:28  Echocardiogram on 12/19/2021:  1. Left ventricular ejection fraction, by estimation, is 55 to 60%. The  left ventricle has normal function. The left ventricle has no regional  wall motion abnormalities. Left ventricular diastolic parameters are  indeterminate. Normal global  longitudinla   strain of -17.1%.   2. Right ventricular systolic function is normal. The right ventricular  size is normal. Tricuspid regurgitation signal is inadequate for assessing  PA pressure.   3. The mitral valve is grossly normal. Trivial mitral valve  regurgitation.   4. The aortic valve is tricuspid. Aortic valve regurgitation is not  visualized. No aortic stenosis is present. Aortic valve mean gradient  measures 5.0 mmHg.   5. The inferior vena cava is normal in size with greater than 50%  respiratory variability, suggesting right atrial pressure of 3 mmHg.   6. Aortic dilatation noted. There is borderline dilatation of the aortic  arch, measuring 35 mm.    Coronary CTA on October 17, 2022: IMPRESSION: 1. Coronary calcium score of 14.8. This was 62nd percentile for age-, sex, and race-matched controls.   2. Normal coronary origin with left dominance.   3. Mild non-calcified plaque in the mid LAD (25-49%).   4. Mild calcified plaque in the distal LCX (25-49%).   5. Minimal non-calcified plaque (<25%) in the non-dominant RCA.   6.  No significant extracardiac findings.   RECOMMENDATIONS: 1. Mild non-obstructive CAD (25-49%). Consider non-atherosclerotic causes of chest pain. Consider preventive therapy and risk factor modification.  14-day ZIO February, 2024 my interpretation Sinus rhythm 54 to 136 bpm, average 86 bpm Brief episodes of SVT occurred.  No pauses or block. There were multiple patient triggered events associated with sinus rhythm  2. Type 2 diabetes mellitus with other specified complication, with long-term current use of insulin (HCC) Slightly above goal. Will aim to discontinue insulin. Restarted metformin as below. Provided patient with sample of BG kit in office for him to monitor BG at home. Provided refill as below.  - Bayer DCA Hb A1c Waived - metFORMIN (GLUCOPHAGE) 500 MG tablet; Take 1 tablet (500 mg total) by mouth 2 (two) times daily with a  meal.  Dispense: 180 tablet; Refill: 0 - Blood Glucose Monitoring Suppl DEVI; 1 each by Does not apply route in the morning, at noon, and at bedtime. May substitute to any manufacturer covered by patient's insurance.  Dispense: 1 each; Refill: 0 - Glucose Blood (BLOOD GLUCOSE TEST STRIPS) STRP; 1 each by In Vitro route in  the morning, at noon, and at bedtime. May substitute to any manufacturer covered by patient's insurance.  Dispense: 100 strip; Refill: 0 - Lancet Device MISC; 1 each by Does not apply route in the morning, at noon, and at bedtime. May substitute to any manufacturer covered by patient's insurance.  Dispense: 1 each; Refill: 0 - Lancets Misc. MISC; 1 each by Does not apply route in the morning, at noon, and at bedtime. May substitute to any manufacturer covered by patient's insurance.  Dispense: 100 each; Refill: 0  3. Benzodiazepine dependence (HCC) Patient states anxiety and depression are very well controlled. Does not wish to continue on xanax at this time. Will not refill. Discussed with patient other options for anxiety control. He declines at this time. Safety contract established. Denies SI. Patient to notify provider if he would like to start medication or counseling.   4. Narcolepsy due to underlying condition without cataplexy Patient is established with neurology. Reviewed notes form 05/22/2023 with Cory Roughen, MD.  Reviewed EEG report from 05/23/2023.  Patient to continue on current treatment and to follow up with neurology.  Will have patient follow up to start controlled substance agreement.   5. Encounter for screening for cardiovascular disorders Labs as below. Will communicate results to patient once available. Will await results to determine next steps.  Last meal 0300 - Lipid panel  6. Personal history of other endocrine, nutritional and metabolic disease Labs as below. Will communicate results to patient once available. Will await results to determine next steps.   - CBC with Differential/Platelet - CMP14+EGFR - Bayer DCA Hb A1c Waived - Vitamin B12 - VITAMIN D 25 Hydroxy (Vit-D Deficiency, Fractures) - TSH   The above assessment and management plan was discussed with the patient. The patient verbalized understanding of and has agreed to the management plan using shared-decision making. Patient is aware to call the clinic if they develop any new symptoms or if symptoms fail to improve or worsen. Patient is aware when to return to the clinic for a follow-up visit. Patient educated on when it is appropriate to go to the emergency department.   Return in about 4 weeks (around 12/05/2023) for Chronic Condition Follow up.   Neale Burly, DNP-FNP [facility] [address] [place], [place] [address] [phone]

## 2023-11-07 NOTE — Patient Instructions (Signed)
Metformin 500 mg twice a day for a week  500mg  in am and 1000 mg in the pm for a week  1000mg  twice a day

## 2023-11-08 ENCOUNTER — Telehealth: Payer: Self-pay | Admitting: Family Medicine

## 2023-11-08 ENCOUNTER — Encounter: Payer: Self-pay | Admitting: Family Medicine

## 2023-11-08 LAB — CBC WITH DIFFERENTIAL/PLATELET
Basophils Absolute: 0.1 10*3/uL (ref 0.0–0.2)
Basos: 1 %
EOS (ABSOLUTE): 0.5 10*3/uL — ABNORMAL HIGH (ref 0.0–0.4)
Eos: 6 %
Hematocrit: 50.2 % (ref 37.5–51.0)
Hemoglobin: 16.7 g/dL (ref 13.0–17.7)
Immature Grans (Abs): 0 10*3/uL (ref 0.0–0.1)
Immature Granulocytes: 0 %
Lymphocytes Absolute: 1.5 10*3/uL (ref 0.7–3.1)
Lymphs: 19 %
MCH: 27.9 pg (ref 26.6–33.0)
MCHC: 33.3 g/dL (ref 31.5–35.7)
MCV: 84 fL (ref 79–97)
Monocytes Absolute: 0.8 10*3/uL (ref 0.1–0.9)
Monocytes: 10 %
Neutrophils Absolute: 5.3 10*3/uL (ref 1.4–7.0)
Neutrophils: 64 %
Platelets: 292 10*3/uL (ref 150–450)
RBC: 5.99 x10E6/uL — ABNORMAL HIGH (ref 4.14–5.80)
RDW: 13.6 % (ref 11.6–15.4)
WBC: 8.1 10*3/uL (ref 3.4–10.8)

## 2023-11-08 LAB — CMP14+EGFR
ALT: 26 [IU]/L (ref 0–44)
AST: 22 [IU]/L (ref 0–40)
Albumin: 4.5 g/dL (ref 3.8–4.9)
Alkaline Phosphatase: 93 [IU]/L (ref 44–121)
BUN/Creatinine Ratio: 10 (ref 9–20)
BUN: 12 mg/dL (ref 6–24)
Bilirubin Total: 0.6 mg/dL (ref 0.0–1.2)
CO2: 25 mmol/L (ref 20–29)
Calcium: 9.6 mg/dL (ref 8.7–10.2)
Chloride: 99 mmol/L (ref 96–106)
Creatinine, Ser: 1.18 mg/dL (ref 0.76–1.27)
Globulin, Total: 2.4 g/dL (ref 1.5–4.5)
Glucose: 131 mg/dL — ABNORMAL HIGH (ref 70–99)
Potassium: 4 mmol/L (ref 3.5–5.2)
Sodium: 139 mmol/L (ref 134–144)
Total Protein: 6.9 g/dL (ref 6.0–8.5)
eGFR: 73 mL/min/{1.73_m2} (ref >59)

## 2023-11-08 LAB — LIPID PANEL
Chol/HDL Ratio: 2.9 {ratio} (ref 0.0–5.0)
Cholesterol, Total: 98 mg/dL — ABNORMAL LOW (ref 100–199)
HDL: 34 mg/dL — ABNORMAL LOW (ref >39)
LDL Chol Calc (NIH): 48 mg/dL (ref 0–99)
Triglycerides: 75 mg/dL (ref 0–149)
VLDL Cholesterol Cal: 16 mg/dL (ref 5–40)

## 2023-11-08 LAB — VITAMIN B12: Vitamin B-12: 347 pg/mL (ref 232–1245)

## 2023-11-08 LAB — MICROALBUMIN / CREATININE URINE RATIO
Creatinine, Urine: 166.6 mg/dL
Microalb/Creat Ratio: 13 mg/g{creat} (ref 0–29)
Microalbumin, Urine: 22.3 ug/mL

## 2023-11-08 LAB — VITAMIN D 25 HYDROXY (VIT D DEFICIENCY, FRACTURES): Vit D, 25-Hydroxy: 31.7 ng/mL (ref 30.0–100.0)

## 2023-11-08 LAB — TSH: TSH: 2.71 u[IU]/mL (ref 0.450–4.500)

## 2023-11-08 NOTE — Progress Notes (Signed)
Mild variations on CBC, not concerning at this time. Elevated BG consistent with A1C. A1C not at goal as discussed in office visit. Will restart metformin. Total cholesterol is low, which is great. HDL is "good cholesterol" and also low. Recommend increasing healthy fats in diet such as fish, nuts, avocados, eggs. Based on ascvd risk score, recommend  patient stay on statin.   ASCVD (Atherosclerotic Cardiovascular Disease) Risk Algorithm including Known ASCVD from AHA/ACC from [email or web address]  on 11/08/2023 RESULT SUMMARY: 16.6 % Risk of cardiovascular event (coronary or stroke death or non-fatal MI or stroke) in next 10 years. High-intensity statin recommended because of known diabetes and 10-year risk >7.5%.  If LDL 70mg /dl (5.62 mmol/L), additional factors like lifestyle and risk-benefits can be considered before starting statins INPUTS: History of ASCVD -> 0 = No LDL Cholesterol >=190mg /dL (5.63 mmol/L) -> 0 = No Age -> 52 years Diabetes -> 1 = Yes Sex -> 1 = Male Total Cholesterol -> 98 mg/dL HDL Cholesterol -> 34 mg/dL Systolic Blood Pressure -> 148 mm Hg Treatment for Hypertension -> 1 = Yes Smoker -> 1 = Yes Race -> 1 = White

## 2023-11-08 NOTE — Telephone Encounter (Addendum)
Patient aware. Patient rescheduled for Feb 7th

## 2023-11-08 NOTE — Telephone Encounter (Signed)
Discussed with supervising provider that we can continue treatment for narcolepsy. Please have him reschedule his Feb 27 appt to any day before the 13th so that we can complete CSA and complete toxassure.

## 2023-11-12 NOTE — Progress Notes (Addendum)
 GI Office Note    Referring Provider: Cathlene Marry Lenis* Primary Care Physician:  Cathlene Marry Lenis, FNP  Primary Gastroenterologist:Charles K. Cindie, DO   Chief Complaint   Chief Complaint  Patient presents with   Follow-up    Some vomiting off and on past 2 weeks. Pt's stool are yellow and a lot of gas    History of Present Illness   Gary Wyatt is a 53 y.o. male presenting today for further evaluation of vomiting, change in stools. Last seen September 2022 for postprandial epigastric pain, chronic GERD, loose stool.  Celiac serologies negative back in 2022.  Recent labs November 07, 2023: A1c 7.8, hemoglobin 16.7, white blood cell count 8100, platelets 292, eosinophils 0.5 slightly elevated, LFTs normal, B12 347, TSH 2.710.  Today: Patient states that he had episode of vomiting last Monday, occurred after eating spicy foods, threw up brown liquid.  Did okay until last Friday when he had another episode of emesis, this time not occurring with spicy foods.  Having frequent nausea.  Prior to last Monday denied any of the symptoms.  Over the past several weeks he has been having breakthrough heartburn symptoms.  Burning all the way up into his throat.  States he takes pantoprazole  40 mg after breakfast and over-the-counter omeprazole  in the evenings around 10 PM before going to work.  No dysphagia.  No melena or rectal bleeding.  For about 1 month his stools have been intermittently been loose, yellow in color with a lot of air.  This morning he had a normal stool.  No abdominal pain.     He did not bring a complete list of his medications.  He will let us  know accurate list once he is able to get home.  There are several NSAIDs listed.  He is not sure if he is taking these.  He does mention recently starting Ozempic about 4 to 5 weeks ago, just took increased dosage 1 mg on Monday.  States that he has not been taking Ozempic regularly, took the 0.5 mg dose about 3 of  the 4 doses in the past 5 weeks.  Denies significant alcohol use.  Only occasionally consumes alcohol.  No prior colonoscopy.  He believes he completed a Cologuard last year with his PCP.  CTA chest/abdomen/pelvis April 2024: IMPRESSION: 1. Negative for aortic dissection, aneurysm or other acute vascular pathology. 2. Mild scattered atherosclerotic plaque. Aortic Atherosclerosis (ICD10-I70.0). 3. Replaced right hepatic artery. 4. Multiple renal arteries bilaterally. 5. Small fat containing right inguinal hernia. 6.  Patent celiac, SMA, IMA.  EGD July 25, 2021: -Esophageal mucosal changes suspicious for eosinophilic esophagitis status post biopsy, negative for eosinophilic esophagitis, consistent with chronic reflux findings -Z-line irregular, 40 cm from the incisors status post biopsy, no specific histopathologic changes -Gastritis status post biopsy, reactive gastropathy, no H. pylori -Normal duodenal bulb, first portion duodenum and second portion duodenum  Medications   Current Outpatient Medications  Medication Sig Dispense Refill   Baclofen 20mg  BID            atorvastatin  (LIPITOR) 20 MG tablet Take 1 tablet (20 mg total) by mouth daily. 30 tablet 6    Isosorbide  monomitrate 45 mg daily      cloNIDine (CATAPRES) 0.1 MG tablet TAKE ONE TABLET TWICE DAILY AS NEEDED FOR BP OVER 170     cyclobenzaprine  (FLEXERIL ) 10 MG tablet Take 10 mg by mouth 3 (three) times daily as needed.     hydrochlorothiazide  (HYDRODIURIL )  12.5 MG tablet TAKE ONE TABLET DAILY AS NEEDED     HYDROcodone -acetaminophen  (NORCO) 5-325 MG tablet Take 1 tablet by mouth every 8 (eight) hours as needed for moderate pain. 10 tablet 0               isosorbide  mononitrate (IMDUR ) 30 MG 24 hr tablet Take 1.5 tablets (45 mg total) by mouth daily. 135 tablet 1          meloxicam (MOBIC) 15 MG tablet Take 7.5-15 mg by mouth daily as needed.     metFORMIN  (GLUCOPHAGE ) 500 MG tablet Take 1 tablet (500 mg total) by  mouth 2 (two) times daily with a meal. 180 tablet 0                nabumetone (RELAFEN) 500 MG tablet Take 1 tablet by mouth 2 (two) times daily.            nitroGLYCERIN  (NITROSTAT ) 0.4 MG SL tablet Place 0.4 mg under the tongue every 5 (five) minutes x 3 doses as needed for chest pain (if no relief after 3rd dose, proceed to ED or call 911).     omeprazole  (PRILOSEC) 20 MG capsule Take 1 capsule (20 mg total) by mouth at bedtime.     OZEMPIC, 1 MG/DOSE, 4 MG/3ML SOPN INJECT ONE MG ONCE WEEKLY     pantoprazole  (PROTONIX ) 40 MG tablet Take 1 tablet (40 mg total) by mouth daily. TAKE ONE TABLET TWICE DAILY FOR 14 DAYS, THEN ONE DAILY (Patient taking differently: Take 40 mg by mouth daily. Take once daily before breakfast) 90 tablet 0   sildenafil (VIAGRA) 100 MG tablet Take 100 mg by mouth daily as needed for erectile dysfunction.     TRESIBA FLEXTOUCH 200 UNIT/ML FlexTouch Pen Inject 32 Units into the skin at bedtime.     valsartan  (DIOVAN ) 320 MG tablet TAKE 1/2 TABLET ONCE A DAY 45 tablet 2   valsartan -hydrochlorothiazide  (DIOVAN -HCT) 320-12.5 MG tablet Take 1 tablet by mouth daily.                                   Current Facility-Administered Medications  Medication Dose Route Frequency Provider Last Rate Last Admin   acetaminophen  (TYLENOL ) tablet 975 mg  975 mg Oral BID PRN Miriam Norris, NP        Allergies   Allergies as of 11/13/2023 - Review Complete 11/13/2023  Allergen Reaction Noted   Demerol [meperidine] Itching and Other (See Comments) 03/25/2012        Review of Systems   General: Negative for anorexia, weight loss, fever, chills, fatigue, weakness. Eyes: Negative for vision changes.  ENT: Negative for hoarseness, difficulty swallowing , nasal congestion. CV: Negative for chest pain, angina, palpitations, dyspnea on exertion, peripheral edema.  Respiratory: Negative for dyspnea at rest, dyspnea on exertion, cough, sputum, wheezing.  GI: See history of present  illness. GU:  Negative for dysuria, hematuria, urinary incontinence, urinary frequency, nocturnal urination.  MS: Negative for joint pain, low back pain.  Derm: Negative for rash or itching.  Neuro: Negative for weakness, abnormal sensation, seizure, frequent headaches, memory loss,  confusion.  Psych: Negative for anxiety, depression, suicidal ideation, hallucinations.  Endo: Negative for unusual weight change.  Heme: Negative for bruising or bleeding. Allergy: Negative for rash or hives.  Physical Exam   BP (!) 173/98   Pulse 97   Temp 98.6 F (37 C)   Ht  6' 2 (1.88 m)   Wt 223 lb 3.2 oz (101.2 kg)   BMI 28.66 kg/m    General: Well-nourished, well-developed in no acute distress.  Head: Normocephalic, atraumatic.   Eyes: Conjunctiva pink, no icterus. Mouth: Oropharyngeal mucosa moist and pink  Neck: Supple without thyromegaly, masses, or lymphadenopathy.  Lungs: Clear to auscultation bilaterally.  Heart: Regular rate and rhythm, no murmurs rubs or gallops.  Abdomen: Bowel sounds are normal, nontender, nondistended, no hepatosplenomegaly or masses,  no abdominal bruits or hernia, no rebound or guarding.  This diastases Rectal: Not performed Extremities: No lower extremity edema. No clubbing or deformities.  Neuro: Alert and oriented x 4 , grossly normal neurologically.  Skin: Warm and dry, no rash or jaundice.   Psych: Alert and cooperative, normal mood and affect.  Labs   Lab Results  Component Value Date   TSH 2.710 11/07/2023   Lab Results  Component Value Date   NA 139 11/07/2023   CL 99 11/07/2023   K 4.0 11/07/2023   CO2 25 11/07/2023   BUN 12 11/07/2023   CREATININE 1.18 11/07/2023   EGFR 73 11/07/2023   CALCIUM  9.6 11/07/2023   ALBUMIN 4.5 11/07/2023   GLUCOSE 131 (H) 11/07/2023   Lab Results  Component Value Date   ALT 26 11/07/2023   AST 22 11/07/2023   ALKPHOS 93 11/07/2023   BILITOT 0.6 11/07/2023   Lab Results  Component Value Date   WBC  8.1 11/07/2023   HGB 16.7 11/07/2023   HCT 50.2 11/07/2023   MCV 84 11/07/2023   PLT 292 11/07/2023   Lab Results  Component Value Date   VITAMINB12 347 11/07/2023   Lab Results  Component Value Date   HGBA1C 7.8 (H) 11/07/2023     Imaging Studies   No results found.  Assessment/plan   GERD: -breakthrough symptoms using pantoprazole  40 mg daily after breakfast and over-the-counter omeprazole  at 10 PM before work -Suspect symptoms exacerbated by use of Ozempic causing delayed gastric emptying -Encouraged him to take his pantoprazole  before breakfast for better efficacy.  He will let us  know list of all of his medications and we may be able to make some changes to better control his symptoms -EGD 2022 as outlined above, may need to repeat if his symptoms do not settle down -reinforced antireflux measures, encouraged small frequent meals, avoid overeating, eat slowly  N/V: -Suspect side effect from Ozempic as symptoms just recently starting after beginning medication but other etiologies could be refractory GERD, peptic ulcer disease, biliary disease although patient denies any abdominal pain -He will get us  a list of his medication so that we can make some adjustments -Encouraged him to avoid spicy foods, greasy foods, eat slowly, encourage small frequent meals due to likely symptomatic delayed gastric emptying related to Ozempic -encouraged him to take Ozempic on a regular schedule if he plans to continue, he admits to skipping doses, this could further exacerbate symptoms/side effects.  -If symptoms do not settle down, he will require further workup  Loose stool: -Intermittent, more notable in the past 3 to 4 weeks since being on Ozempic.  We saw him in 2022 with similar symptoms.  May have underlying diabetic enteropathy, IBS.  Cannot rule out IBD.  Celiac screen was negative at that time.  Patient reports negative Cologuard last year, we requested records.  Cannot rule out need  for colonoscopy if ongoing symptoms   Sonny RAMAN. Ezzard, MHS, PA-C [facility]

## 2023-11-13 ENCOUNTER — Encounter: Payer: Self-pay | Admitting: Gastroenterology

## 2023-11-13 ENCOUNTER — Ambulatory Visit (INDEPENDENT_AMBULATORY_CARE_PROVIDER_SITE_OTHER): Payer: 59 | Admitting: Gastroenterology

## 2023-11-13 VITALS — BP 173/98 | HR 97 | Temp 98.6°F | Ht 74.0 in | Wt 223.2 lb

## 2023-11-13 DIAGNOSIS — R112 Nausea with vomiting, unspecified: Secondary | ICD-10-CM | POA: Insufficient documentation

## 2023-11-13 DIAGNOSIS — R195 Other fecal abnormalities: Secondary | ICD-10-CM | POA: Diagnosis not present

## 2023-11-13 DIAGNOSIS — K219 Gastro-esophageal reflux disease without esophagitis: Secondary | ICD-10-CM | POA: Diagnosis not present

## 2023-11-13 MED ORDER — OMEPRAZOLE 20 MG PO CPDR: 20.0000 mg | DELAYED_RELEASE_CAPSULE | Freq: Every evening | ORAL | Status: DC

## 2023-11-13 NOTE — Patient Instructions (Addendum)
 Please call with complete list of your medications including any over the counter medications or prescription medications that you use as needed.  We will provide further recommendations at that time.  I suspect your nausea/vomiting, change in stools, worsening heartburn patient reports early are related to the ozempic. It is important to avoid fatty foods, spicy foods, and eat small amounts at a time.

## 2023-11-14 ENCOUNTER — Telehealth: Payer: Self-pay

## 2023-11-14 ENCOUNTER — Other Ambulatory Visit: Payer: Self-pay

## 2023-11-14 DIAGNOSIS — R195 Other fecal abnormalities: Secondary | ICD-10-CM

## 2023-11-14 DIAGNOSIS — R112 Nausea with vomiting, unspecified: Secondary | ICD-10-CM

## 2023-11-14 NOTE — Telephone Encounter (Signed)
 He told me yesterday that he was taking omeprazole  OTC in addition to his pantoprazole . He was also on ozempic and tresiba.  Did these change?  Let's have him increase his pantoprazole  to 40mg  BID before meals. Stop omeprazole  OTC.  He should not take both nabumetone and meloxicam due to them both being NSAIDs, it can damage kidneys and irritate GI tract/cause ulcers  Let's have him complete labs: CRP, sed rate, lipase.

## 2023-11-14 NOTE — Progress Notes (Signed)
 Subjective:  Patient ID: Gary Wyatt, male    DOB: 08-06-1971, 53 y.o.   MRN: [id number]  Patient Care Team: Cathlene Marry Lenis, FNP as PCP - General (Family Medicine) Alvan Dorn FALCON, MD as PCP - Cardiology (Cardiology) Mealor, Eulas BRAVO, MD as PCP - Electrophysiology (Cardiology) Shaaron Lamar HERO, MD as Consulting Physician (Gastroenterology) Georjean Darice HERO, MD as Consulting Physician (Neurology) Vicci Mcardle, [place] (Optometry)   Chief Complaint:  controlled substance agreement  HPI: Gary Wyatt is a 53 y.o. male presenting on 11/15/2023 for controlled substance agreement  Patient presents today to establish CSA and complete toxassure. Pt takes methylphenidate  (ritalin ) for treatment of narcolepsy. He is established with neurology, but has not followed up with them since August 2024. He is coming up on a refill, due 11/20/22 Medication side effects- none  Weight loss- none Sleeping habits- works night shift and is able to sleep during the day  Any concerns- HTN  Elevated BP, worked night shift last night.  States that he still struggles to stay awake all night.  [place] CS[place] reviewed: Yes Any suspicious activity on [place] Csrs: No Contract signed: yes - 11/15/23  Diabetes  Unable to tolerate ozempic, would like to try something else.   HTN  No longer taking amlodipine . Believes that he was taken off by Cardiology.  He is taking clonidine, hydrochlorothiazide , valsartan , imdur   Relevant past medical, surgical, family, and social history reviewed and updated as indicated.  Allergies and medications reviewed and updated. Data reviewed: Chart in Epic.   Past Medical History:  Diagnosis Date   Anxiety    Depression    Diabetes mellitus (HCC)    type 2   Hypertension    Insomnia    Kidney stone    Narcolepsy    TIA (transient ischemic attack)     Past Surgical History:  Procedure Laterality Date   BIOPSY  07/25/2021   Procedure: BIOPSY;  Surgeon: Cindie Carlin POUR, DO;  [facility];  Service: Endoscopy;;   ESOPHAGOGASTRODUODENOSCOPY (EGD) WITH PROPOFOL  N/A 07/25/2021   Procedure: ESOPHAGOGASTRODUODENOSCOPY (EGD) WITH PROPOFOL ;  Surgeon: Cindie Carlin POUR, DO;  Location: [facility];  Service: Endoscopy;  Laterality: N/A;  7:30am   NO PAST SURGERIES      Social History   Socioeconomic History   Marital status: Divorced    Spouse name: Not on file   Number of children: Not on file   Years of education: Not on file   Highest education level: 12th grade  Occupational History   Not on file  Tobacco Use   Smoking status: Never    Passive exposure: Never   Smokeless tobacco: Current    Types: Snuff  Vaping Use   Vaping status: Never Used  Substance and Sexual Activity   Alcohol use: Yes    Comment: occas   Drug use: No   Sexual activity: Not on file  Other Topics Concern   Not on file  Social History Narrative   Are you right handed or left handed? Right    Are you currently employed ? 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 No    Who lives with you? Fiance    What type of home do you live in: 1 story or 2 story?  1 story        Social Drivers of Health   Financial Resource Strain: Medium Risk (11/07/2023)  Overall Financial Resource Strain (CARDIA)    Difficulty of Paying Living Expenses: Somewhat hard  Food Insecurity: No Food Insecurity (11/07/2023)   Hunger Vital Sign    Worried About Running Out of Food in the Last Year: Never true    Ran Out of Food in the Last Year: Never true  Recent Concern: Food Insecurity - Food Insecurity Present (11/07/2023)   Hunger Vital Sign    Worried About Running Out of Food in the Last Year: Sometimes true    Ran Out of Food in the Last Year: Sometimes true  Transportation Needs: No Transportation Needs (11/07/2023)   PRAPARE - Administrator, Civil Service (Medical): No    Lack of Transportation (Non-Medical): No  Physical  Activity: Sufficiently Active (11/07/2023)   Exercise Vital Sign    Days of Exercise per Week: 5 days    Minutes of Exercise per Session: 30 min  Stress: No Stress Concern Present (11/07/2023)   [employer] of Occupational Health - Occupational Stress Questionnaire    Feeling of Stress : Not at all  Social Connections: Moderately Isolated (11/07/2023)   Social Connection and Isolation Panel [NHANES]    Frequency of Communication with Friends and Family: More than three times a week    Frequency of Social Gatherings with Friends and Family: More than three times a week    Attends Religious Services: More than 4 times per year    Active Member of [employer] or Organizations: No    Attends Engineer, Structural: Not on file    Marital Status: Divorced  Intimate Partner Violence: Not At Risk (11/07/2023)   Humiliation, Afraid, Rape, and Kick questionnaire    Fear of Current or Ex-Partner: No    Emotionally Abused: No    Physically Abused: No    Sexually Abused: No    Outpatient Encounter Medications as of 11/15/2023  Medication Sig   ALPRAZolam (XANAX) 0.5 MG tablet Take 0.5 mg by mouth 2 (two) times daily as needed for anxiety.   aspirin  81 MG EC tablet Take 81 mg by mouth daily. Swallow whole.   atorvastatin  (LIPITOR) 20 MG tablet Take 1 tablet (20 mg total) by mouth daily.   B-D ULTRAFINE III SHORT PEN 31G X 8 MM MISC SMARTSIG:1 pen needle SUB-Q Twice Daily (Patient not taking: Reported on 11/13/2023)   Blood Glucose Monitoring Suppl DEVI 1 each by Does not apply route in the morning, at noon, and at bedtime. May substitute to any manufacturer covered by patient's insurance. (Patient not taking: Reported on 11/13/2023)   chlorthalidone  (HYGROTON ) 25 MG tablet Take 25 mg by mouth daily.   cloNIDine (CATAPRES) 0.1 MG tablet TAKE ONE TABLET TWICE DAILY AS NEEDED FOR BP OVER 170   cyclobenzaprine  (FLEXERIL ) 10 MG tablet Take 10 mg by mouth 3 (three) times daily as needed.   Glucose Blood  (BLOOD GLUCOSE TEST STRIPS) STRP 1 each by In Vitro route in the morning, at noon, and at bedtime. May substitute to any manufacturer covered by patient's insurance. (Patient not taking: Reported on 11/13/2023)   hydrochlorothiazide  (HYDRODIURIL ) 12.5 MG tablet TAKE ONE TABLET DAILY AS NEEDED   HYDROcodone -acetaminophen  (NORCO) 5-325 MG tablet Take 1 tablet by mouth every 8 (eight) hours as needed for moderate pain.   hydrOXYzine (ATARAX) 25 MG tablet Take 25-50 mg by mouth at bedtime as needed (sleep).   ibuprofen  (ADVIL ) 800 MG tablet TAKE ONE TABLET THREE TIMES DAILY AS NEEDED FOR PAIN   isosorbide  mononitrate (IMDUR )  30 MG 24 hr tablet Take 1.5 tablets (45 mg total) by mouth daily.   Lancet Device MISC 1 each by Does not apply route in the morning, at noon, and at bedtime. May substitute to any manufacturer covered by patient's insurance. (Patient not taking: Reported on 11/13/2023)   Lancets Misc. MISC 1 each by Does not apply route in the morning, at noon, and at bedtime. May substitute to any manufacturer covered by patient's insurance. (Patient not taking: Reported on 11/13/2023)   loperamide (IMODIUM) 2 MG capsule TAKE ONE CAPSULE EVERY 6 HOURS AS NEEDED FOR DIARRHEA   meloxicam (MOBIC) 15 MG tablet Take 7.5-15 mg by mouth daily as needed.   metFORMIN  (GLUCOPHAGE ) 500 MG tablet Take 1 tablet (500 mg total) by mouth 2 (two) times daily with a meal.   methylphenidate  (RITALIN ) 20 MG tablet Take 20 mg by mouth 2 (two) times daily.    metoprolol  tartrate (LOPRESSOR ) 50 MG tablet Take 1 tablet (50 mg total) by mouth 2 (two) times daily.   nabumetone (RELAFEN) 500 MG tablet Take 1 tablet by mouth 2 (two) times daily.   naproxen (NAPROSYN) 500 MG tablet Take 1 tablet by mouth 2 (two) times daily as needed.   nitroGLYCERIN  (NITROSTAT ) 0.4 MG SL tablet Place 0.4 mg under the tongue every 5 (five) minutes x 3 doses as needed for chest pain (if no relief after 3rd dose, proceed to ED or call 911).    omeprazole  (PRILOSEC) 20 MG capsule Take 1 capsule (20 mg total) by mouth at bedtime.   OZEMPIC, 1 MG/DOSE, 4 MG/3ML SOPN INJECT ONE MG ONCE WEEKLY   pantoprazole  (PROTONIX ) 40 MG tablet Take 1 tablet (40 mg total) by mouth daily. TAKE ONE TABLET TWICE DAILY FOR 14 DAYS, THEN ONE DAILY (Patient taking differently: Take 40 mg by mouth daily. Take once daily before breakfast)   sildenafil (VIAGRA) 100 MG tablet Take 100 mg by mouth daily as needed for erectile dysfunction.   TRESIBA FLEXTOUCH 200 UNIT/ML FlexTouch Pen Inject 32 Units into the skin at bedtime.   valsartan  (DIOVAN ) 320 MG tablet TAKE 1/2 TABLET ONCE A DAY   valsartan -hydrochlorothiazide  (DIOVAN -HCT) 320-12.5 MG tablet Take 1 tablet by mouth daily.   Facility-Administered Encounter Medications as of 11/15/2023  Medication   acetaminophen  (TYLENOL ) tablet 975 mg    Allergies  Allergen Reactions   Demerol [Meperidine] Itching and Other (See Comments)    irritation    Review of Systems As per HPI  Objective:  BP (!) 158/94   Pulse 92   Temp 98.5 F (36.9 C)   Ht 6' 2 (1.88 m)   Wt 224 lb (101.6 kg)   SpO2 97%   BMI 28.76 kg/m    Wt Readings from Last 3 Encounters:  11/13/23 223 lb 3.2 oz (101.2 kg)  11/07/23 225 lb (102.1 kg)  06/06/23 222 lb (100.7 kg)    Physical Exam Constitutional:      General: He is awake. He is not in acute distress.    Appearance: Normal appearance. He is well-developed, well-groomed and overweight. He is not ill-appearing, toxic-appearing or diaphoretic.  Cardiovascular:     Rate and Rhythm: Normal rate and regular rhythm.     Pulses: Normal pulses.          Radial pulses are 2+ on the right side and 2+ on the left side.       Posterior tibial pulses are 2+ on the right side and 2+ on the left side.  Heart sounds: Normal heart sounds. No murmur heard.    No gallop.  Pulmonary:     Effort: Pulmonary effort is normal. No respiratory distress.     Breath sounds: Normal breath  sounds. No stridor. No wheezing, rhonchi or rales.  Musculoskeletal:     Cervical back: Full passive range of motion without pain and neck supple.     Right lower leg: No edema.     Left lower leg: No edema.  Skin:    General: Skin is warm.     Capillary Refill: Capillary refill takes less than 2 seconds.  Neurological:     General: No focal deficit present.     Mental Status: He is alert, oriented to person, place, and time and easily aroused. Mental status is at baseline.     GCS: GCS eye subscore is 4. GCS verbal subscore is 5. GCS motor subscore is 6.     Motor: No weakness.  Psychiatric:        Attention and Perception: Attention and perception normal.        Mood and Affect: Mood and affect normal.        Speech: Speech normal.        Behavior: Behavior normal. Behavior is cooperative.        Thought Content: Thought content normal. Thought content does not include homicidal or suicidal ideation. Thought content does not include homicidal or suicidal plan.        Cognition and Memory: Cognition and memory normal.        Judgment: Judgment normal.     Results for orders placed or performed in visit on 11/07/23  Microalbumin / creatinine urine ratio   Collection Time: 11/07/23  8:47 AM  Result Value Ref Range   Creatinine, Urine 166.6 Not Estab. mg/dL   Microalbumin, Urine 77.6 Not Estab. ug/mL   Microalb/Creat Ratio 13 0 - 29 mg/g creat  Bayer DCA Hb A1c Waived   Collection Time: 11/07/23  8:53 AM  Result Value Ref Range   HB A1C (BAYER DCA - WAIVED) 7.8 (H) 4.8 - 5.6 %  CBC with Differential/Platelet   Collection Time: 11/07/23  8:54 AM  Result Value Ref Range   WBC 8.1 3.4 - 10.8 x10E3/uL   RBC 5.99 (H) 4.14 - 5.80 x10E6/uL   Hemoglobin 16.7 13.0 - 17.7 g/dL   Hematocrit 49.7 62.4 - 51.0 %   MCV 84 79 - 97 fL   MCH 27.9 26.6 - 33.0 pg   MCHC 33.3 31.5 - 35.7 g/dL   RDW 86.3 88.3 - 84.5 %   Platelets 292 150 - 450 x10E3/uL   Neutrophils 64 Not Estab. %   Lymphs  19 Not Estab. %   Monocytes 10 Not Estab. %   Eos 6 Not Estab. %   Basos 1 Not Estab. %   Neutrophils Absolute 5.3 1.4 - 7.0 x10E3/uL   Lymphocytes Absolute 1.5 0.7 - 3.1 x10E3/uL   Monocytes Absolute 0.8 0.1 - 0.9 x10E3/uL   EOS (ABSOLUTE) 0.5 (H) 0.0 - 0.4 x10E3/uL   Basophils Absolute 0.1 0.0 - 0.2 x10E3/uL   Immature Granulocytes 0 Not Estab. %   Immature Grans (Abs) 0.0 0.0 - 0.1 x10E3/uL  CMP14+EGFR   Collection Time: 11/07/23  8:54 AM  Result Value Ref Range   Glucose 131 (H) 70 - 99 mg/dL   BUN 12 6 - 24 mg/dL   Creatinine, Ser 8.81 0.76 - 1.27 mg/dL   eGFR 73 >40 fO/fpw/8.26  BUN/Creatinine Ratio 10 9 - 20   Sodium 139 134 - 144 mmol/L   Potassium 4.0 3.5 - 5.2 mmol/L   Chloride 99 96 - 106 mmol/L   CO2 25 20 - 29 mmol/L   Calcium  9.6 8.7 - 10.2 mg/dL   Total Protein 6.9 6.0 - 8.5 g/dL   Albumin 4.5 3.8 - 4.9 g/dL   Globulin, Total 2.4 1.5 - 4.5 g/dL   Bilirubin Total 0.6 0.0 - 1.2 mg/dL   Alkaline Phosphatase 93 44 - 121 IU/L   AST 22 0 - 40 IU/L   ALT 26 0 - 44 IU/L  Lipid panel   Collection Time: 11/07/23  8:54 AM  Result Value Ref Range   Cholesterol, Total 98 (L) 100 - 199 mg/dL   Triglycerides 75 0 - 149 mg/dL   HDL 34 (L) >60 mg/dL   VLDL Cholesterol Cal 16 5 - 40 mg/dL   LDL Chol Calc (NIH) 48 0 - 99 mg/dL   Chol/HDL Ratio 2.9 0.0 - 5.0 ratio  Vitamin B12   Collection Time: 11/07/23  8:54 AM  Result Value Ref Range   Vitamin B-12 347 232 - 1,245 pg/mL  VITAMIN D  25 Hydroxy (Vit-D Deficiency, Fractures)   Collection Time: 11/07/23  8:54 AM  Result Value Ref Range   Vit D, 25-Hydroxy 31.7 30.0 - 100.0 ng/mL  TSH   Collection Time: 11/07/23  8:54 AM  Result Value Ref Range   TSH 2.710 0.450 - 4.500 uIU/mL       11/07/2023    8:44 AM  Depression screen PHQ 2/9  Decreased Interest 0  Down, Depressed, Hopeless 0  PHQ - 2 Score 0  Altered sleeping 0  Tired, decreased energy 2  Change in appetite 0  Feeling bad or failure about yourself  0   Trouble concentrating 0  Moving slowly or fidgety/restless 0  Suicidal thoughts 0  PHQ-9 Score 2       11/07/2023    8:45 AM  GAD 7 : Generalized Anxiety Score  Nervous, Anxious, on Edge 0  Control/stop worrying 2  Worry too much - different things 2  Trouble relaxing 0  Restless 0  Easily annoyed or irritable 0  Afraid - awful might happen 0  Total GAD 7 Score 4  Anxiety Difficulty Not difficult at all   Pertinent labs & imaging results that were available during my care of the patient were reviewed by me and considered in my medical decision making.  Assessment & Plan:  Nima was seen today for controlled substance agreement.  Diagnoses and all orders for this visit:  Narcolepsy due to underlying condition without cataplexy Completed toxassure. Will send in medications as below. Will coordinate with neurology for further care.  -     ToxASSURE Select 13 (MW), Urine -     methylphenidate  (RITALIN ) 20 MG tablet; Take 1 tablet (20 mg total) by mouth 2 (two) times daily. -     methylphenidate  (RITALIN ) 20 MG tablet; Take 1 tablet (20 mg total) by mouth 2 (two) times daily. -     methylphenidate  (RITALIN ) 20 MG tablet; Take 1 tablet (20 mg total) by mouth 2 (two) times daily.  Controlled substance agreement signed Labs as below. Will communicate results to patient once available. Will await results to determine next steps.  -     ToxASSURE Select 13 (MW), Urine  Hypertension associated with type 2 diabetes mellitus [facility]) Reviewed notes from Cardiology, Miriam, NP 05/27/2023 and Nancey, MD  06/06/2023. Will restart medication as below at a low dose. Will also increase hydrochlorothiazide  to 25 mg daily.  -     amLODipine  (NORVASC ) 5 MG tablet; Take 1 tablet (5 mg total) by mouth daily. - hydrochlorothiazide  (HYDRODIURIL ) 12.5 MG tablet; Take 2 tablets (25 mg total) by mouth daily.  Dispense: 60 tablet; Refill: 0  Type 2 diabetes mellitus with other specified complication, with  long-term current use of insulin  (HCC) Unable to tolerate injection. Will start medication as below.  -     Semaglutide  (RYBELSUS ) 3 MG TABS; Take 1 tablet (3 mg total) by mouth daily.   Continue all other maintenance medications.  Follow up plan: Return in about 6 weeks (around 12/27/2023).  Continue healthy lifestyle choices, including diet (rich in fruits, vegetables, and lean proteins, and low in salt and simple carbohydrates) and exercise (at least 30 minutes of moderate physical activity daily).  Written and verbal instructions provided   The above assessment and management plan was discussed with the patient. The patient verbalized understanding of and has agreed to the management plan. Patient is aware to call the clinic if they develop any new symptoms or if symptoms persist or worsen. Patient is aware when to return to the clinic for a follow-up visit. Patient educated on when it is appropriate to go to the emergency department.   Marry Kins, DNP-FNP [facility] [address] [place], [place] [address] [phone]

## 2023-11-14 NOTE — Telephone Encounter (Signed)
Pt's medication list has been updated.  

## 2023-11-14 NOTE — Telephone Encounter (Signed)
 Contacted pt and he stated that he is taking the tresiba and will no longer be taking the ozempic. Med list was updated. Pt was made aware of the increase in pantoprazole  and recommendations on Nsaids. Labs were ordered and pt was instructed to have done.

## 2023-11-14 NOTE — Addendum Note (Signed)
 Addended by: Beatrice Lin on: 11/14/2023 10:32 AM   Modules accepted: Orders

## 2023-11-15 ENCOUNTER — Encounter: Payer: Self-pay | Admitting: Family Medicine

## 2023-11-15 ENCOUNTER — Ambulatory Visit (INDEPENDENT_AMBULATORY_CARE_PROVIDER_SITE_OTHER): Payer: 59 | Admitting: Family Medicine

## 2023-11-15 VITALS — BP 158/94 | HR 92 | Temp 98.5°F | Ht 74.0 in | Wt 224.0 lb

## 2023-11-15 DIAGNOSIS — Z794 Long term (current) use of insulin: Secondary | ICD-10-CM | POA: Diagnosis not present

## 2023-11-15 DIAGNOSIS — I152 Hypertension secondary to endocrine disorders: Secondary | ICD-10-CM

## 2023-11-15 DIAGNOSIS — E1169 Type 2 diabetes mellitus with other specified complication: Secondary | ICD-10-CM

## 2023-11-15 DIAGNOSIS — Z79899 Other long term (current) drug therapy: Secondary | ICD-10-CM

## 2023-11-15 DIAGNOSIS — G47429 Narcolepsy in conditions classified elsewhere without cataplexy: Secondary | ICD-10-CM | POA: Diagnosis not present

## 2023-11-15 DIAGNOSIS — E1159 Type 2 diabetes mellitus with other circulatory complications: Secondary | ICD-10-CM

## 2023-11-15 MED ORDER — RYBELSUS 3 MG PO TABS
3.0000 mg | ORAL_TABLET | Freq: Every day | ORAL | 0 refills | Status: AC
Start: 2023-11-15 — End: ?

## 2023-11-15 MED ORDER — AMLODIPINE BESYLATE 5 MG PO TABS: 5.0000 mg | ORAL_TABLET | Freq: Every day | ORAL | 0 refills | Status: AC

## 2023-11-15 MED ORDER — METHYLPHENIDATE HCL 20 MG PO TABS: 20.0000 mg | ORAL_TABLET | Freq: Two times a day (BID) | ORAL | 0 refills | Status: AC

## 2023-11-15 MED ORDER — HYDROCHLOROTHIAZIDE 12.5 MG PO TABS: 25.0000 mg | ORAL_TABLET | Freq: Every day | ORAL | 0 refills | Status: AC

## 2023-11-18 ENCOUNTER — Telehealth: Payer: Self-pay

## 2023-11-18 NOTE — Telephone Encounter (Signed)
-----   Message from Gary Wyatt sent at 11/18/2023 12:48 PM EST ----- Pls let him know his PCP contacted us  for follow-up. We had discussed doing a brain MRI and 3-day EEG for his symptoms, if he would like to proceed, pls re-order if we need to re-order, thanks ----- Message ----- From: Chrystine Crate, FNP Sent: 11/15/2023   2:41 PM EST To: Gary Moss, MD  Hi Dr. Ty Gales,  I wanted to reach out about a mutual patient. I have resumed his ritalin  for treatment of narcolepsy and wanted to make sure that was in line with your treatment plan. He was seen by your office on 05/22/23 and was lost to follow up.  Thank you!

## 2023-11-18 NOTE — Telephone Encounter (Signed)
 Pt called no answer left a voice mail to call the office back when he calls back we need to let him know that his PCP contacted us  for follow-up. We had discussed doing a brain MRI and 3-day EEG for his symptoms, if he would like to proceed, we will refax his MRI order and Joie Narrow will call him to schedule EEG ,

## 2023-11-19 ENCOUNTER — Encounter: Payer: Self-pay | Admitting: Family Medicine

## 2023-11-19 LAB — TOXASSURE SELECT 13 (MW), URINE

## 2023-11-19 NOTE — Addendum Note (Signed)
Addended by: Neale Burly on: 11/19/2023 02:43 PM   Modules accepted: Orders

## 2023-11-19 NOTE — Telephone Encounter (Signed)
Pt called no answer

## 2023-11-19 NOTE — Progress Notes (Signed)
Please have patient come in this week to complete second toxassure.

## 2023-11-20 NOTE — Telephone Encounter (Signed)
Pt called no answer will also send a mychart message

## 2023-11-22 ENCOUNTER — Telehealth: Payer: Self-pay | Admitting: Family Medicine

## 2023-11-22 ENCOUNTER — Other Ambulatory Visit: Payer: 59

## 2023-11-22 NOTE — Telephone Encounter (Signed)
Pt came in to do lab work and after finding that he had to give a urine sample, he refused and said that he was just going to go back to seeing his other doctor.

## 2023-11-26 NOTE — Telephone Encounter (Signed)
 [facility] and canceled refills. Patient should have 30 day supply to bridge until he follows up with his former prescriber.

## 2023-12-02 ENCOUNTER — Encounter: Payer: Self-pay | Admitting: Family Medicine

## 2023-12-02 ENCOUNTER — Ambulatory Visit (INDEPENDENT_AMBULATORY_CARE_PROVIDER_SITE_OTHER): Payer: 59 | Admitting: Family Medicine

## 2023-12-02 VITALS — BP 146/95 | HR 102 | Temp 98.9°F | Ht 74.0 in | Wt 224.0 lb

## 2023-12-02 DIAGNOSIS — J069 Acute upper respiratory infection, unspecified: Secondary | ICD-10-CM

## 2023-12-02 DIAGNOSIS — R52 Pain, unspecified: Secondary | ICD-10-CM | POA: Diagnosis not present

## 2023-12-02 DIAGNOSIS — J101 Influenza due to other identified influenza virus with other respiratory manifestations: Secondary | ICD-10-CM | POA: Diagnosis not present

## 2023-12-02 LAB — VERITOR FLU A/B WAIVED
Influenza A: POSITIVE — AB
Influenza B: NEGATIVE

## 2023-12-02 MED ORDER — OSELTAMIVIR PHOSPHATE 75 MG PO CAPS
75.0000 mg | ORAL_CAPSULE | Freq: Two times a day (BID) | ORAL | 0 refills | Status: AC
Start: 2023-12-02 — End: 2023-12-07

## 2023-12-02 MED ORDER — PROMETHAZINE-DM 6.25-15 MG/5ML PO SYRP: 5.0000 mL | ORAL_SOLUTION | Freq: Four times a day (QID) | ORAL | 0 refills | Status: AC | PRN

## 2023-12-02 NOTE — Progress Notes (Signed)
 BP (!) 146/95   Pulse (!) 102   Temp 98.9 F (37.2 C)   Ht 6\' 2"  (1.88 m)   Wt 224 lb (101.6 kg)   SpO2 96%   BMI 28.76 kg/m    Subjective:   Patient ID: Gary Wyatt, male    DOB: January 19, 1971, 53 y.o.   MRN: [id number]  HPI: Gary Wyatt is a 53 y.o. male presenting on 12/02/2023 for URI   HPI Patient today symptoms congestion and coughing body aches and posttussive emesis.  He says it all started yesterday and then last night he had some high fevers in the 101 range and just feeling achy and not feeling well.  He is taking Mucinex and cough syrup and some Tylenol and that has brought his temperature down.  He denies any shortness of breath or wheezing.  He still feels very sweaty and clammy today and just not feeling well.  He has sick contacts that had flu at work.  Relevant past medical, surgical, family and social history reviewed and updated as indicated. Interim medical history since our last visit reviewed. Allergies and medications reviewed and updated.  Review of Systems  Constitutional:  Positive for chills and fever.  HENT:  Positive for congestion, postnasal drip, rhinorrhea, sinus pressure, sneezing and sore throat. Negative for ear discharge, ear pain and voice change.   Eyes:  Negative for pain, discharge, redness and visual disturbance.  Respiratory:  Positive for cough. Negative for shortness of breath and wheezing.   Cardiovascular:  Negative for chest pain and leg swelling.  Musculoskeletal:  Positive for myalgias. Negative for gait problem.  Skin:  Negative for rash.  All other systems reviewed and are negative.   Per HPI unless specifically indicated above   Allergies as of 12/02/2023       Reactions   Demerol [meperidine] Itching, Other (See Comments)   irritation        Medication List        Accurate as of December 02, 2023  9:21 AM. If you have any questions, ask your nurse or doctor.          amLODipine 5 MG tablet Commonly known  as: NORVASC Take 1 tablet (5 mg total) by mouth daily.   atorvastatin 20 MG tablet Commonly known as: LIPITOR Take 1 tablet (20 mg total) by mouth daily.   baclofen 20 MG tablet Commonly known as: LIORESAL Take 20 mg by mouth 2 (two) times daily.   cloNIDine 0.1 MG tablet Commonly known as: CATAPRES TAKE ONE TABLET TWICE DAILY AS NEEDED FOR BP OVER 170   cyclobenzaprine 10 MG tablet Commonly known as: FLEXERIL Take 10 mg by mouth 3 (three) times daily as needed.   hydrochlorothiazide 12.5 MG tablet Commonly known as: HYDRODIURIL Take 2 tablets (25 mg total) by mouth daily.   isosorbide mononitrate 30 MG 24 hr tablet Commonly known as: IMDUR Take 1.5 tablets (45 mg total) by mouth daily.   meloxicam 15 MG tablet Commonly known as: MOBIC Take 7.5-15 mg by mouth daily as needed.   metFORMIN 500 MG tablet Commonly known as: GLUCOPHAGE Take 1 tablet (500 mg total) by mouth 2 (two) times daily with a meal.   methylphenidate 20 MG tablet Commonly known as: RITALIN Take 1 tablet (20 mg total) by mouth 2 (two) times daily.   methylphenidate 20 MG tablet Commonly known as: RITALIN Take 1 tablet (20 mg total) by mouth 2 (two) times daily. Start taking on: December 21, 2023   methylphenidate 20 MG tablet Commonly known as: RITALIN Take 1 tablet (20 mg total) by mouth 2 (two) times daily. Start taking on: January 20, 2024   nabumetone 500 MG tablet Commonly known as: RELAFEN Take 1 tablet by mouth 2 (two) times daily.   nitroGLYCERIN 0.4 MG SL tablet Commonly known as: NITROSTAT Place 0.4 mg under the tongue every 5 (five) minutes x 3 doses as needed for chest pain (if no relief after 3rd dose, proceed to ED or call 911).   oseltamivir 75 MG capsule Commonly known as: Tamiflu Take 1 capsule (75 mg total) by mouth 2 (two) times daily for 5 days. Started by: Elige Radon Benn Tarver   pantoprazole 40 MG tablet Commonly known as: PROTONIX Take 1 tablet (40 mg total) by mouth  daily. TAKE ONE TABLET TWICE DAILY FOR 14 DAYS, THEN ONE DAILY What changed: additional instructions   promethazine-dextromethorphan 6.25-15 MG/5ML syrup Commonly known as: PROMETHAZINE-DM Take 5 mLs by mouth 4 (four) times daily as needed for cough. Started by: Elige Radon Olivier Frayre   Rybelsus 3 MG Tabs Generic drug: Semaglutide Take 1 tablet (3 mg total) by mouth daily.   sildenafil 100 MG tablet Commonly known as: VIAGRA Take 100 mg by mouth daily as needed for erectile dysfunction.   Evaristo Bury FlexTouch 200 UNIT/ML FlexTouch Pen Generic drug: insulin degludec Inject 32 Units into the skin daily.   valsartan 320 MG tablet Commonly known as: DIOVAN TAKE 1/2 TABLET ONCE A DAY         Objective:   BP (!) 146/95   Pulse (!) 102   Temp 98.9 F (37.2 C)   Ht 6\' 2"  (1.88 m)   Wt 224 lb (101.6 kg)   SpO2 96%   BMI 28.76 kg/m   Wt Readings from Last 3 Encounters:  12/02/23 224 lb (101.6 kg)  11/15/23 224 lb (101.6 kg)  11/13/23 223 lb 3.2 oz (101.2 kg)    Physical Exam Vitals and nursing note reviewed.  Constitutional:      General: He is not in acute distress.    Appearance: He is well-developed. He is not diaphoretic.  HENT:     Right Ear: Tympanic membrane, ear canal and external ear normal.     Left Ear: Tympanic membrane, ear canal and external ear normal.     Nose: Mucosal edema and rhinorrhea present.     Right Sinus: Maxillary sinus tenderness present. No frontal sinus tenderness.     Left Sinus: Maxillary sinus tenderness present. No frontal sinus tenderness.     Mouth/Throat:     Pharynx: Uvula midline. Posterior oropharyngeal erythema present. No oropharyngeal exudate.     Tonsils: No tonsillar abscesses.  Eyes:     General: No scleral icterus.       Right eye: No discharge.     Conjunctiva/sclera: Conjunctivae normal.     Pupils: Pupils are equal, round, and reactive to light.  Neck:     Thyroid: No thyromegaly.  Cardiovascular:     Rate and Rhythm:  Normal rate and regular rhythm.     Heart sounds: Normal heart sounds. No murmur heard. Pulmonary:     Effort: Pulmonary effort is normal. No respiratory distress.     Breath sounds: Normal breath sounds. No wheezing, rhonchi or rales.  Musculoskeletal:        General: Normal range of motion.     Cervical back: Neck supple.  Lymphadenopathy:     Cervical: No cervical adenopathy.  Skin:    General: Skin is warm and dry.     Findings: No rash.  Neurological:     Mental Status: He is alert and oriented to person, place, and time.     Coordination: Coordination normal.  Psychiatric:        Behavior: Behavior normal.       Assessment & Plan:   Problem List Items Addressed This Visit   None Visit Diagnoses       Upper respiratory tract infection, unspecified type    -  Primary   Relevant Medications   promethazine-dextromethorphan (PROMETHAZINE-DM) 6.25-15 MG/5ML syrup   oseltamivir (TAMIFLU) 75 MG capsule   Other Relevant Orders   Veritor Flu A/B Waived   Novel Coronavirus, NAA ([facility])     Body aches       Relevant Medications   promethazine-dextromethorphan (PROMETHAZINE-DM) 6.25-15 MG/5ML syrup   oseltamivir (TAMIFLU) 75 MG capsule   Other Relevant Orders   Veritor Flu A/B Waived   Novel Coronavirus, NAA ([facility])     Influenza A       Relevant Medications   promethazine-dextromethorphan (PROMETHAZINE-DM) 6.25-15 MG/5ML syrup   oseltamivir (TAMIFLU) 75 MG capsule     Flu a positive, will give Tamiflu and also sent a cough syrup. Continue with Mucinex and Tylenol Follow up plan: Return if symptoms worsen or fail to improve.  Counseling provided for all of the vaccine components Orders Placed This Encounter  Procedures   Novel Coronavirus, NAA ([facility])   Veritor Flu A/B Waived    Arville Care, MD [facility] 12/02/2023, 9:21 AM

## 2023-12-03 ENCOUNTER — Telehealth: Payer: Self-pay | Admitting: Family Medicine

## 2023-12-03 LAB — NOVEL CORONAVIRUS, NAA: SARS-CoV-2, NAA: NOT DETECTED

## 2023-12-03 NOTE — Telephone Encounter (Signed)
 Spoke with pt. He believes he pulled a muscle in his stomach due to how bad he is coughing. Confirmed with pt that he does have a muscle relaxer, he can take IBU and he is currently taking his cough med as prescribed. Letter provider for pt to be out of work until Monday. Pt will call back on Monday if he has not improved. He will come by office and pick up letter on 2/26.

## 2023-12-03 NOTE — Telephone Encounter (Signed)
 Copied from CRM [id number]. Topic: Clinical - Medical Advice >> Dec 03, 2023  9:05 AM Gaetano Hawthorne wrote: Reason for CRM: Patient seen yesterday - he was provided with a work note and he needs that extended through Friday, if that note could be modified and sent via MyChart - He is coughing so much that his stomach is tightening - he wanted to make the office aware of that - Please call patient when you have a moment.

## 2023-12-04 ENCOUNTER — Encounter: Payer: Self-pay | Admitting: Family Medicine

## 2023-12-05 ENCOUNTER — Ambulatory Visit: Payer: 59 | Admitting: Family Medicine

## 2023-12-09 ENCOUNTER — Telehealth: Payer: Self-pay | Admitting: Family Medicine

## 2023-12-09 ENCOUNTER — Ambulatory Visit: Payer: 59 | Admitting: Cardiology

## 2023-12-09 DIAGNOSIS — Z0279 Encounter for issue of other medical certificate: Secondary | ICD-10-CM

## 2023-12-09 NOTE — Telephone Encounter (Signed)
 Gary Wyatt dropped off FMLA forms to be completed and signed.  Form Fee Paid? (Y/N)      y      If NO, form is placed on front office manager desk to hold until payment received. If YES, then form will be placed in the RX/HH Nurse Coordinators box for completion.  Form will not be processed until payment is received

## 2023-12-13 ENCOUNTER — Telehealth: Payer: Self-pay | Admitting: *Deleted

## 2023-12-13 NOTE — Telephone Encounter (Signed)
 PCP completed and signed FMLA forms. They have been faxed to [employer] at fax number [phone]. Patient has been contacted and informed they are complete. Copy at front desk.

## 2023-12-19 ENCOUNTER — Telehealth: Payer: Self-pay | Admitting: *Deleted

## 2023-12-24 ENCOUNTER — Other Ambulatory Visit: Payer: Self-pay | Admitting: Cardiology

## 2023-12-27 ENCOUNTER — Encounter: Payer: Self-pay | Admitting: Family Medicine

## 2023-12-27 ENCOUNTER — Ambulatory Visit: Payer: 59 | Admitting: Family Medicine

## 2023-12-30 ENCOUNTER — Encounter: Payer: Self-pay | Admitting: Family Medicine

## 2023-12-30 ENCOUNTER — Encounter: Payer: Self-pay | Admitting: *Deleted

## 2024-01-03 ENCOUNTER — Ambulatory Visit: Payer: 59 | Attending: Cardiology | Admitting: Cardiology

## 2024-01-03 NOTE — Progress Notes (Deleted)
 Clinical Summary [name] is a 53 y.o.male  1.Chest pain/SOB - seen by PA Vincenza Hews 08/2021 in clinic with chest pain - exertional pain with associated diaphoresis - 07/2020 nuclear stress: no ischemia. Exericsed nearly 10 minutes -   Jan 2024 coronary CTA: mild CAD 11/2022 echo: LVEF 60-65%, no WMAs, grade I dd - 12/2022 monitor: 10 runs SVT longest 25 seconds.    2. Epigastric pain - Recent EGD on 07/25/2021 showed esophageal mucosal changes suspicious for eosinophilic esophagitis   3. HTN - compliant with meds - home bp's 160-180s/90s-110s     - no snoring, no witnessed apneic episodes, some chronic fatigue/hypersomnolence - no significant NSAID use - just occasional EtOH, once a month    4. Syncope vs seizure - seen by EP, not thought consistent with syncope. Patient was to see neuro  - neuro planned for MRI, EEG. Patient has not completed testing.    Past Medical History:  Diagnosis Date   Anxiety    Depression    Diabetes mellitus (HCC)    type 2   Hypertension    Insomnia    Kidney stone    Narcolepsy    TIA (transient ischemic attack)      Allergies  Allergen Reactions   Demerol [Meperidine] Itching and Other (See Comments)    irritation     Current Outpatient Medications  Medication Sig Dispense Refill   amLODipine (NORVASC) 5 MG tablet Take 1 tablet (5 mg total) by mouth daily. 90 tablet 0   atorvastatin (LIPITOR) 20 MG tablet Take 1 tablet (20 mg total) by mouth daily. 30 tablet 6   baclofen (LIORESAL) 20 MG tablet Take 20 mg by mouth 2 (two) times daily.     cloNIDine (CATAPRES) 0.1 MG tablet TAKE ONE TABLET TWICE DAILY AS NEEDED FOR BP OVER 170     cyclobenzaprine (FLEXERIL) 10 MG tablet Take 10 mg by mouth 3 (three) times daily as needed.     hydrochlorothiazide (HYDRODIURIL) 12.5 MG tablet Take 2 tablets (25 mg total) by mouth daily. 60 tablet 0   insulin degludec (TRESIBA FLEXTOUCH) 200 UNIT/ML FlexTouch Pen Inject 32 Units into the skin  daily.     isosorbide mononitrate (IMDUR) 30 MG 24 hr tablet Take 1.5 tablets (45 mg total) by mouth daily. 135 tablet 1   meloxicam (MOBIC) 15 MG tablet Take 7.5-15 mg by mouth daily as needed.     metFORMIN (GLUCOPHAGE) 500 MG tablet Take 1 tablet (500 mg total) by mouth 2 (two) times daily with a meal. 180 tablet 0   methylphenidate (RITALIN) 20 MG tablet Take 1 tablet (20 mg total) by mouth 2 (two) times daily. 60 tablet 0   methylphenidate (RITALIN) 20 MG tablet Take 1 tablet (20 mg total) by mouth 2 (two) times daily. 60 tablet 0   [START ON 01/20/2024] methylphenidate (RITALIN) 20 MG tablet Take 1 tablet (20 mg total) by mouth 2 (two) times daily. 60 tablet 0   nabumetone (RELAFEN) 500 MG tablet Take 1 tablet by mouth 2 (two) times daily.     nitroGLYCERIN (NITROSTAT) 0.4 MG SL tablet Place 0.4 mg under the tongue every 5 (five) minutes x 3 doses as needed for chest pain (if no relief after 3rd dose, proceed to ED or call 911).     pantoprazole (PROTONIX) 40 MG tablet TAKE ONE TABLET DAILY 90 tablet 0   promethazine-dextromethorphan (PROMETHAZINE-DM) 6.25-15 MG/5ML syrup Take 5 mLs by mouth 4 (four) times daily as  needed for cough. 118 mL 0   Semaglutide (RYBELSUS) 3 MG TABS Take 1 tablet (3 mg total) by mouth daily. 30 tablet 0   sildenafil (VIAGRA) 100 MG tablet Take 100 mg by mouth daily as needed for erectile dysfunction.     valsartan (DIOVAN) 320 MG tablet TAKE 1/2 TABLET ONCE A DAY 45 tablet 2   No current facility-administered medications for this visit.     Past Surgical History:  Procedure Laterality Date   BIOPSY  07/25/2021   Procedure: BIOPSY;  Surgeon: Lanelle Bal, DO;  Location: [facility];  Service: Endoscopy;;   ESOPHAGOGASTRODUODENOSCOPY (EGD) WITH PROPOFOL N/A 07/25/2021   Procedure: ESOPHAGOGASTRODUODENOSCOPY (EGD) WITH PROPOFOL;  Surgeon: Lanelle Bal, DO;  Location: [facility];  Service: Endoscopy;  Laterality: N/A;  7:30am   NO PAST SURGERIES        Allergies  Allergen Reactions   Demerol [Meperidine] Itching and Other (See Comments)    irritation      Family History  Problem Relation Age of Onset   Hypertension Mother    Diabetes Father    CAD Father    Prostate cancer Father    Colon cancer Neg Hx      Social History [name] reports that he has never smoked. He has never been exposed to tobacco smoke. His smokeless tobacco use includes snuff. [name] reports current alcohol use.   Review of Systems CONSTITUTIONAL: No weight loss, fever, chills, weakness or fatigue.  HEENT: Eyes: No visual loss, blurred vision, double vision or yellow sclerae.No hearing loss, sneezing, congestion, runny nose or sore throat.  SKIN: No rash or itching.  CARDIOVASCULAR:  RESPIRATORY: No shortness of breath, cough or sputum.  GASTROINTESTINAL: No anorexia, nausea, vomiting or diarrhea. No abdominal pain or blood.  GENITOURINARY: No burning on urination, no polyuria NEUROLOGICAL: No headache, dizziness, syncope, paralysis, ataxia, numbness or tingling in the extremities. No change in bowel or bladder control.  MUSCULOSKELETAL: No muscle, back pain, joint pain or stiffness.  LYMPHATICS: No enlarged nodes. No history of splenectomy.  PSYCHIATRIC: No history of depression or anxiety.  ENDOCRINOLOGIC: No reports of sweating, cold or heat intolerance. No polyuria or polydipsia.  Marland Kitchen   Physical Examination There were no vitals filed for this visit. There were no vitals filed for this visit.  Gen: resting comfortably, no acute distress HEENT: no scleral icterus, pupils equal round and reactive, no palptable cervical adenopathy,  CV Resp: Clear to auscultation bilaterally GI: abdomen is soft, non-tender, non-distended, normal bowel sounds, no hepatosplenomegaly MSK: extremities are warm, no edema.  Skin: warm, no rash Neuro:  no focal deficits Psych: appropriate affect   Diagnostic Studies  Jan 2024 coronary CTA 1.  Coronary calcium score of 14.8. This was 62nd percentile for age-, sex, and race-matched controls.   2. Normal coronary origin with left dominance.   3. Mild non-calcified plaque in the mid LAD (25-49%).   4. Mild calcified plaque in the distal LCX (25-49%).   5. Minimal non-calcified plaque (<25%) in the non-dominant RCA.     Assessment and Plan   1. HTN - difficult to control bp's - stop diovan HCT. Start valsartan by itself 320 mg daily and chlorthlaidone 25mg  daily - in 2 weeks check bmet/mg/tsh/renin/aldo ratio - he will update [place] on bp's in 1 week. Could consider changing toprol to more bp effective beta blocker like coreg or labetalol, could consider aldactone for resistant HTN as well - if ongoing issues with bp  control would consider sleep study, renal artery [place] - given info on DASH diet, discussed low sodium diet   - work note given as he reports cannot work unless bp <150/100, will take a few days for these changes to take effect, excuse given for Mon through Thurs this week.        Antoine Poche, M.D., F.A.C.C.

## 2024-02-01 ENCOUNTER — Other Ambulatory Visit: Payer: Self-pay | Admitting: Nurse Practitioner

## 2024-02-03 ENCOUNTER — Other Ambulatory Visit: Payer: Self-pay | Admitting: Family Medicine

## 2024-02-03 DIAGNOSIS — E1169 Type 2 diabetes mellitus with other specified complication: Secondary | ICD-10-CM

## 2024-02-16 ENCOUNTER — Telehealth: Payer: Self-pay | Admitting: Gastroenterology

## 2024-02-16 NOTE — Telephone Encounter (Signed)
 Please let pt know that when we requested copy of his Cologuard that he thought he had done, we were told by PCP that he has never had one.   I see that he has not completed labs as previously requested.   We can offered him to complete his labs we had plans on work up and consideration of colonoscopy depending on labs. He will need follow up ov before we could schedule a procedure since we have not seen him in several months.

## 2024-02-17 NOTE — Telephone Encounter (Signed)
Noted response

## 2024-02-17 NOTE — Telephone Encounter (Signed)
 Lmom for pt to return call. Sent pt a [employer] as well.

## 2024-03-09 ENCOUNTER — Other Ambulatory Visit: Payer: Self-pay | Admitting: Nurse Practitioner

## 2024-04-28 ENCOUNTER — Other Ambulatory Visit: Payer: Self-pay | Admitting: Cardiology

## 2024-06-06 ENCOUNTER — Other Ambulatory Visit: Payer: Self-pay | Admitting: Cardiology

## 2024-08-03 ENCOUNTER — Encounter: Payer: Self-pay | Admitting: Internal Medicine

## 2024-09-24 ENCOUNTER — Other Ambulatory Visit: Payer: Self-pay | Admitting: Cardiology

## 2024-10-08 ENCOUNTER — Encounter: Payer: Self-pay | Admitting: Gastroenterology

## 2024-12-30 ENCOUNTER — Ambulatory Visit: Admitting: Urology
